# Patient Record
Sex: Female | Born: 1975 | Race: White | Hispanic: No | Marital: Married | Smoking: Former smoker
Health system: Southern US, Community
[De-identification: ages and names within clinical notes are randomized; demographics above are authoritative.]

## PROBLEM LIST (undated history)

## (undated) ENCOUNTER — Emergency Department (HOSPITAL_COMMUNITY): Discharge status: Absent without leave | Payer: Managed Care, Other (non HMO)

## (undated) DIAGNOSIS — F419 Anxiety disorder, unspecified: Secondary | ICD-10-CM

## (undated) DIAGNOSIS — D649 Anemia, unspecified: Secondary | ICD-10-CM

## (undated) DIAGNOSIS — Z9189 Other specified personal risk factors, not elsewhere classified: Secondary | ICD-10-CM

## (undated) DIAGNOSIS — G8929 Other chronic pain: Secondary | ICD-10-CM

## (undated) DIAGNOSIS — M069 Rheumatoid arthritis, unspecified: Secondary | ICD-10-CM

## (undated) DIAGNOSIS — R112 Nausea with vomiting, unspecified: Secondary | ICD-10-CM

## (undated) DIAGNOSIS — Z9889 Other specified postprocedural states: Secondary | ICD-10-CM

## (undated) DIAGNOSIS — N200 Calculus of kidney: Secondary | ICD-10-CM

## (undated) DIAGNOSIS — R51 Headache: Secondary | ICD-10-CM

## (undated) DIAGNOSIS — R0600 Dyspnea, unspecified: Secondary | ICD-10-CM

## (undated) DIAGNOSIS — F32A Depression, unspecified: Secondary | ICD-10-CM

## (undated) DIAGNOSIS — R519 Headache, unspecified: Secondary | ICD-10-CM

## (undated) DIAGNOSIS — N390 Urinary tract infection, site not specified: Secondary | ICD-10-CM

## (undated) DIAGNOSIS — I10 Essential (primary) hypertension: Secondary | ICD-10-CM

## (undated) DIAGNOSIS — Z8719 Personal history of other diseases of the digestive system: Secondary | ICD-10-CM

## (undated) DIAGNOSIS — K219 Gastro-esophageal reflux disease without esophagitis: Secondary | ICD-10-CM

## (undated) DIAGNOSIS — Z87442 Personal history of urinary calculi: Secondary | ICD-10-CM

## (undated) DIAGNOSIS — G43909 Migraine, unspecified, not intractable, without status migrainosus: Secondary | ICD-10-CM

## (undated) DIAGNOSIS — Z9289 Personal history of other medical treatment: Secondary | ICD-10-CM

## (undated) DIAGNOSIS — R002 Palpitations: Secondary | ICD-10-CM

## (undated) HISTORY — DX: Headache, unspecified: R51.9

## (undated) HISTORY — DX: Gastro-esophageal reflux disease without esophagitis: K21.9

## (undated) HISTORY — DX: Urinary tract infection, site not specified: N39.0

## (undated) HISTORY — DX: Essential (primary) hypertension: I10

## (undated) HISTORY — DX: Palpitations: R00.2

## (undated) HISTORY — DX: Headache: R51

## (undated) HISTORY — DX: Calculus of kidney: N20.0

## (undated) HISTORY — DX: Personal history of other medical treatment: Z92.89

## (undated) HISTORY — DX: Migraine, unspecified, not intractable, without status migrainosus: G43.909

## (undated) HISTORY — DX: Morbid (severe) obesity due to excess calories: E66.01

## (undated) HISTORY — PX: HIATAL HERNIA REPAIR: SHX195

## (undated) HISTORY — DX: Other specified personal risk factors, not elsewhere classified: Z91.89

## (undated) HISTORY — DX: Dyspnea, unspecified: R06.00

---
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
## 2003-02-28 HISTORY — PX: CARPAL TUNNEL RELEASE: SHX101

## 2004-03-17 ENCOUNTER — Emergency Department: Payer: Self-pay | Admitting: Unknown Physician Specialty

## 2004-03-18 ENCOUNTER — Emergency Department: Payer: Self-pay | Admitting: Emergency Medicine

## 2004-06-29 ENCOUNTER — Ambulatory Visit: Payer: Self-pay | Admitting: Orthopedic Surgery

## 2004-10-06 ENCOUNTER — Ambulatory Visit: Payer: Self-pay | Admitting: *Deleted

## 2005-04-04 ENCOUNTER — Ambulatory Visit: Payer: Self-pay | Admitting: Orthopedic Surgery

## 2006-01-24 ENCOUNTER — Emergency Department: Payer: Self-pay | Admitting: Emergency Medicine

## 2007-07-09 ENCOUNTER — Emergency Department: Payer: Self-pay | Admitting: Internal Medicine

## 2008-10-31 ENCOUNTER — Emergency Department: Payer: Self-pay | Admitting: Emergency Medicine

## 2010-07-21 ENCOUNTER — Emergency Department: Payer: Self-pay | Admitting: Emergency Medicine

## 2012-10-01 ENCOUNTER — Ambulatory Visit: Payer: Self-pay | Admitting: Specialist

## 2012-10-01 LAB — PHOSPHORUS: Phosphorus: 2.6 mg/dL (ref 2.5–4.9)

## 2012-10-01 LAB — IRON AND TIBC
Iron Saturation: 12 %
Unbound Iron-Bind.Cap.: 483 ug/dL

## 2012-10-01 LAB — MAGNESIUM: Magnesium: 1.8 mg/dL

## 2012-10-01 LAB — LIPASE, BLOOD: Lipase: 98 U/L (ref 73–393)

## 2012-10-01 LAB — HEPATIC FUNCTION PANEL A (ARMC)
Albumin: 3.6 g/dL (ref 3.4–5.0)
Alkaline Phosphatase: 78 U/L (ref 50–136)
Bilirubin, Direct: 0.1 mg/dL (ref 0.00–0.20)
SGOT(AST): 37 U/L (ref 15–37)
SGPT (ALT): 42 U/L (ref 12–78)

## 2012-10-01 LAB — PROTIME-INR
INR: 0.9
Prothrombin Time: 12.3 secs (ref 11.5–14.7)

## 2012-10-01 LAB — FERRITIN: Ferritin (ARMC): 13 ng/mL (ref 8–388)

## 2012-10-01 LAB — FOLATE: Folic Acid: 12.4 ng/mL (ref 3.1–100.0)

## 2012-10-01 LAB — HEMOGLOBIN A1C: Hemoglobin A1C: 4.9 % (ref 4.2–6.3)

## 2012-10-01 LAB — TSH: Thyroid Stimulating Horm: 1.19 u[IU]/mL

## 2012-10-22 ENCOUNTER — Ambulatory Visit: Payer: Self-pay | Admitting: Specialist

## 2012-10-28 ENCOUNTER — Ambulatory Visit: Payer: Self-pay | Admitting: Specialist

## 2013-02-27 HISTORY — PX: CHOLECYSTECTOMY: SHX55

## 2013-04-14 HISTORY — PX: GASTRIC BYPASS: SHX52

## 2013-09-11 ENCOUNTER — Ambulatory Visit: Payer: Self-pay | Admitting: Specialist

## 2014-02-07 ENCOUNTER — Emergency Department: Payer: Self-pay | Admitting: Emergency Medicine

## 2014-02-07 LAB — URINALYSIS, COMPLETE
BILIRUBIN, UR: NEGATIVE
GLUCOSE, UR: NEGATIVE mg/dL (ref 0–75)
Hyaline Cast: 29
Leukocyte Esterase: NEGATIVE
Nitrite: NEGATIVE
Ph: 5 (ref 4.5–8.0)
Specific Gravity: 1.029 (ref 1.003–1.030)
WBC UR: 6 /HPF (ref 0–5)

## 2014-02-07 LAB — CBC WITH DIFFERENTIAL/PLATELET
Basophil #: 0 10*3/uL (ref 0.0–0.1)
Basophil %: 0.2 %
EOS ABS: 0.1 10*3/uL (ref 0.0–0.7)
Eosinophil %: 1 %
HCT: 42.1 % (ref 35.0–47.0)
HGB: 13.8 g/dL (ref 12.0–16.0)
LYMPHS ABS: 2 10*3/uL (ref 1.0–3.6)
Lymphocyte %: 23.9 %
MCH: 29.2 pg (ref 26.0–34.0)
MCHC: 32.9 g/dL (ref 32.0–36.0)
MCV: 89 fL (ref 80–100)
Monocyte #: 0.8 x10 3/mm (ref 0.2–0.9)
Monocyte %: 9.3 %
NEUTROS PCT: 65.6 %
Neutrophil #: 5.5 10*3/uL (ref 1.4–6.5)
Platelet: 214 10*3/uL (ref 150–440)
RBC: 4.74 10*6/uL (ref 3.80–5.20)
RDW: 15.4 % — AB (ref 11.5–14.5)
WBC: 8.4 10*3/uL (ref 3.6–11.0)

## 2014-02-07 LAB — BASIC METABOLIC PANEL
Anion Gap: 8 (ref 7–16)
BUN: 12 mg/dL (ref 7–18)
CREATININE: 0.58 mg/dL — AB (ref 0.60–1.30)
Calcium, Total: 9.1 mg/dL (ref 8.5–10.1)
Chloride: 105 mmol/L (ref 98–107)
Co2: 25 mmol/L (ref 21–32)
EGFR (African American): >60
EGFR (Non-African Amer.): >60
GLUCOSE: 95 mg/dL (ref 65–99)
Osmolality: 275 (ref 275–301)
Potassium: 3.8 mmol/L (ref 3.5–5.1)
Sodium: 138 mmol/L (ref 136–145)

## 2014-02-07 LAB — TROPONIN I

## 2014-03-24 ENCOUNTER — Encounter: Payer: Self-pay | Admitting: Nurse Practitioner

## 2014-03-24 ENCOUNTER — Ambulatory Visit (INDEPENDENT_AMBULATORY_CARE_PROVIDER_SITE_OTHER): Payer: Managed Care, Other (non HMO) | Admitting: Nurse Practitioner

## 2014-03-24 ENCOUNTER — Encounter (INDEPENDENT_AMBULATORY_CARE_PROVIDER_SITE_OTHER): Payer: Self-pay

## 2014-03-24 VITALS — BP 102/72 | HR 64 | Temp 98.2°F | Resp 14 | Ht 68.5 in | Wt 289.8 lb

## 2014-03-24 DIAGNOSIS — J0100 Acute maxillary sinusitis, unspecified: Secondary | ICD-10-CM

## 2014-03-24 MED ORDER — HYDROCOD POLST-CHLORPHEN POLST 10-8 MG/5ML PO LQCR: 5.0000 mL | Freq: Every evening | ORAL | Status: DC | PRN

## 2014-03-24 NOTE — Progress Notes (Signed)
Pre visit review using our clinic review tool, if applicable. No additional management support is needed unless otherwise documented below in the visit note. 

## 2014-03-24 NOTE — Assessment & Plan Note (Signed)
Stable. Pt is still on antibiotics for strep throat. Biggest complaint is lack of sleep from coughing due to PNDrip. Rx for Tussionex at night time. FU prn.

## 2014-03-24 NOTE — Progress Notes (Signed)
Subjective:    Patient ID: [name], female    DOB: 1975-09-02, 39 y.o.   MRN: [id number]  HPI  Ms. Panchal is a 39 yo female with a CC of needing to establish care, strep 1 week ago, and facial pain with runny nose.   1) New pt information:   Diet- High protein, little carbs  Exercise- Unable to recently due to illness  Eye Exam- 2014 or 2013  Dental Exam- Up to date  2) Chronic Problems-  Dr. Darnell Level- Gastric Bypass and Gallbladder 2015  [facility]   Dr. Dear- (Left from [place])    UTI's, one around Christmas time, was hot, spinning head, went to ED 2nd one since August.   3) Acute Problems-  Strep last week- Whole family. Saw telemedicine MD and they assumed strep due to family being positive. Omnicef 300 mg twice daily 7 days of 10 day course. Sill having rhinorrhea and sinus pressure. Cough is worst at night time. Codeine caused stomach pain last time.   Review of Systems  Constitutional: Negative for fever, chills, diaphoresis and fatigue.  HENT: Positive for rhinorrhea, sinus pressure and sore throat. Negative for sneezing.        Clear  Eyes: Negative for visual disturbance.  Respiratory: Positive for cough.   Gastrointestinal: Negative for nausea, vomiting, abdominal pain, diarrhea, constipation and abdominal distention.  Genitourinary: Negative for dysuria.  Skin: Negative for rash.  Hematological: Does not bruise/bleed easily.  Psychiatric/Behavioral: Negative for suicidal ideas and sleep disturbance. The patient is not nervous/anxious.    Past Medical History  Diagnosis Date  . History of fainting spells of unknown cause   . Frequent headaches   . GERD (gastroesophageal reflux disease)   . Hypertension   . Kidney stones   . Migraine   . UTI (lower urinary tract infection)     History   Social History  . Marital Status: Married    Spouse Name: N/A    Number of Children: N/A  . Years of Education: N/A    Occupational History  . Not on file.   Social History Main Topics  . Smoking status: Never Smoker   . Smokeless tobacco: Never Used  . Alcohol Use: No  . Drug Use: No  . Sexual Activity:    Partners: Male     Comment: Husband   Other Topics Concern  . 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 Cholecystectomy  2015  . Gastric bypass  04/14/13  . Growth plate surgery  5638    Right hip  . Carpal tunnel release  2005    Family History  Problem Relation Age of Onset  . Arthritis Mother   . Hypertension Mother   . Mental illness Mother   . Diabetes Mother   . Alcohol abuse Father   . Cancer Father   . Hypertension Father   . Mental illness Brother   . Heart disease Maternal Grandmother   . Hypertension Maternal Grandmother   . Mental illness Maternal Grandmother   . Diabetes Maternal Grandmother   . Cancer Maternal Grandfather   . Hypertension Maternal Grandfather   . Mental illness  Paternal Grandmother     Not on File  No current outpatient prescriptions on file prior to visit.   No current facility-administered medications on file prior to visit.      Objective:   Physical Exam  Constitutional: She is oriented to person, place, and time. She appears well-developed and well-nourished. No distress.  BP 102/72 mmHg  Pulse 64  Temp(Src) 98.2 F (36.8 C) (Oral)  Resp 14  Ht 5' 8.5" (1.74 m)  Wt 289 lb 12.8 oz (131.452 kg)  BMI 43.42 kg/m2  SpO2 97%  LMP    HENT:  Head: Normocephalic and atraumatic.  Right Ear: External ear normal.  Left Ear: External ear normal.  Cardiovascular: Normal rate, regular rhythm, normal heart sounds and intact distal pulses.  Exam reveals no gallop and no friction rub.   No murmur heard. Pulmonary/Chest: Effort  normal and breath sounds normal. No respiratory distress. She has no wheezes. She has no rales. She exhibits no tenderness.  Neurological: She is alert and oriented to person, place, and time. No cranial nerve deficit. She exhibits normal muscle tone. Coordination normal.  Skin: Skin is warm and dry. No rash noted. She is not diaphoretic.  Psychiatric: She has a normal mood and affect. Her behavior is normal. Judgment and thought content normal.      Assessment & Plan:

## 2014-03-24 NOTE — Patient Instructions (Signed)
Keep an eye on your symptoms. Make your appointment for a physical at the [facility] desk.   Tussionex for the cough at night time.   Welcome to [facility]!

## 2014-03-25 ENCOUNTER — Encounter: Payer: Self-pay | Admitting: Nurse Practitioner

## 2014-03-27 ENCOUNTER — Other Ambulatory Visit: Payer: Self-pay | Admitting: Nurse Practitioner

## 2014-03-27 ENCOUNTER — Encounter: Payer: Self-pay | Admitting: Nurse Practitioner

## 2014-03-27 MED ORDER — FLUTICASONE PROPIONATE 50 MCG/ACT NA SUSP: 2.0000 | Freq: Every day | NASAL | Status: DC

## 2014-04-22 ENCOUNTER — Ambulatory Visit (INDEPENDENT_AMBULATORY_CARE_PROVIDER_SITE_OTHER): Payer: Managed Care, Other (non HMO) | Admitting: Nurse Practitioner

## 2014-04-22 ENCOUNTER — Encounter: Payer: Self-pay | Admitting: Nurse Practitioner

## 2014-04-22 ENCOUNTER — Other Ambulatory Visit (HOSPITAL_COMMUNITY)
Admission: RE | Admit: 2014-04-22 | Discharge: 2014-04-22 | Disposition: A | Discharge status: Home / Self Care | Payer: Managed Care, Other (non HMO) | Source: Ambulatory Visit | Attending: Nurse Practitioner | Admitting: Nurse Practitioner

## 2014-04-22 VITALS — BP 122/70 | HR 60 | Temp 97.9°F | Resp 14 | Ht 68.5 in | Wt 291.4 lb

## 2014-04-22 DIAGNOSIS — M5412 Radiculopathy, cervical region: Secondary | ICD-10-CM

## 2014-04-22 DIAGNOSIS — Z124 Encounter for screening for malignant neoplasm of cervix: Secondary | ICD-10-CM

## 2014-04-22 DIAGNOSIS — Z1151 Encounter for screening for human papillomavirus (HPV): Secondary | ICD-10-CM | POA: Diagnosis present

## 2014-04-22 DIAGNOSIS — Z01419 Encounter for gynecological examination (general) (routine) without abnormal findings: Secondary | ICD-10-CM | POA: Insufficient documentation

## 2014-04-22 DIAGNOSIS — Z9884 Bariatric surgery status: Secondary | ICD-10-CM

## 2014-04-22 DIAGNOSIS — M25562 Pain in left knee: Secondary | ICD-10-CM

## 2014-04-22 LAB — LIPID PANEL
Cholesterol: 144 mg/dL (ref 0–200)
HDL: 55.5 mg/dL (ref >39.00)
LDL CALC: 79 mg/dL (ref 0–99)
NONHDL: 88.5
Total CHOL/HDL Ratio: 3
Triglycerides: 46 mg/dL (ref 0.0–149.0)
VLDL: 9.2 mg/dL (ref 0.0–40.0)

## 2014-04-22 LAB — HEMOGLOBIN A1C: HEMOGLOBIN A1C: 4.9 % (ref 4.6–6.5)

## 2014-04-22 LAB — MAGNESIUM: MAGNESIUM: 2 mg/dL (ref 1.5–2.5)

## 2014-04-22 LAB — TSH: TSH: 0.68 u[IU]/mL (ref 0.35–4.50)

## 2014-04-22 LAB — FOLATE: FOLATE: 12.4 ng/mL (ref >5.9)

## 2014-04-22 MED ORDER — PREDNISONE 10 MG PO TABS: ORAL_TABLET | ORAL | Status: DC

## 2014-04-22 MED ORDER — GABAPENTIN 300 MG PO CAPS: 300.0000 mg | ORAL_CAPSULE | Freq: Every day | ORAL | Status: DC
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
## 2014-04-22 NOTE — Patient Instructions (Signed)

## 2014-04-22 NOTE — Progress Notes (Signed)
Pre visit review using our clinic review tool, if applicable. No additional management support is needed unless otherwise documented below in the visit note. 

## 2014-04-24 LAB — CYTOLOGY - PAP

## 2014-04-25 DIAGNOSIS — M25569 Pain in unspecified knee: Secondary | ICD-10-CM | POA: Insufficient documentation

## 2014-04-25 DIAGNOSIS — M5412 Radiculopathy, cervical region: Secondary | ICD-10-CM | POA: Insufficient documentation

## 2014-04-25 DIAGNOSIS — Z01419 Encounter for gynecological examination (general) (routine) without abnormal findings: Secondary | ICD-10-CM | POA: Insufficient documentation

## 2014-04-25 DIAGNOSIS — M25562 Pain in left knee: Secondary | ICD-10-CM | POA: Insufficient documentation

## 2014-04-25 NOTE — Assessment & Plan Note (Signed)
Referral to ortho requested by Pt.

## 2014-04-25 NOTE — Assessment & Plan Note (Signed)
Discussed acute and chronic issues. Reviewed health maintenance measures, PFSHx, and immunizations. Obtain routine labs TSH, Lipid panel, A1c, --> rest was already performed for her routine visit to her bariatric surgeon. Requesting Vitamin B1, magnesium, and folate, will obtain these. Pap and breast exam normal.  (Future information--> uses large speculum).

## 2014-04-25 NOTE — Assessment & Plan Note (Signed)
Referral to ortho requested by pt.

## 2014-04-26 ENCOUNTER — Encounter: Payer: Self-pay | Admitting: Nurse Practitioner

## 2014-04-26 LAB — VITAMIN B1: VITAMIN B1 (THIAMINE): 12 nmol/L (ref 8–30)

## 2014-04-27 ENCOUNTER — Encounter: Payer: Self-pay | Admitting: Nurse Practitioner

## 2014-06-22 ENCOUNTER — Encounter: Payer: Self-pay | Admitting: Nurse Practitioner

## 2014-06-22 ENCOUNTER — Ambulatory Visit (INDEPENDENT_AMBULATORY_CARE_PROVIDER_SITE_OTHER): Payer: Managed Care, Other (non HMO) | Admitting: Nurse Practitioner

## 2014-06-22 VITALS — BP 118/64 | HR 60 | Temp 98.1°F | Resp 14 | Ht 68.5 in | Wt 279.0 lb

## 2014-06-22 DIAGNOSIS — M25562 Pain in left knee: Secondary | ICD-10-CM

## 2014-06-22 DIAGNOSIS — R350 Frequency of micturition: Secondary | ICD-10-CM | POA: Insufficient documentation

## 2014-06-22 DIAGNOSIS — M545 Low back pain, unspecified: Secondary | ICD-10-CM

## 2014-06-22 DIAGNOSIS — M5412 Radiculopathy, cervical region: Secondary | ICD-10-CM

## 2014-06-22 DIAGNOSIS — J0100 Acute maxillary sinusitis, unspecified: Secondary | ICD-10-CM

## 2014-06-22 DIAGNOSIS — M549 Dorsalgia, unspecified: Secondary | ICD-10-CM | POA: Insufficient documentation

## 2014-06-22 MED ORDER — DOXYCYCLINE HYCLATE 100 MG PO TABS: 100.0000 mg | ORAL_TABLET | Freq: Two times a day (BID) | ORAL | Status: DC

## 2014-06-22 MED ORDER — GABAPENTIN 400 MG PO CAPS: 400.0000 mg | ORAL_CAPSULE | Freq: Every day | ORAL | Status: DC

## 2014-06-22 NOTE — Assessment & Plan Note (Signed)
Upped Gabapentin to 400 mg at night. Pt wants to try this instead of upping to 300 mg twice daily. Pt reported slight wooziness after first 3 days of taking, but improved with use. Feels numbness/tingling was improved. Will follow.

## 2014-06-22 NOTE — Assessment & Plan Note (Signed)
Uncontrolled. Will treat with doxy since PCN allergy. Encouraged probiotic use. Will follow.

## 2014-06-22 NOTE — Assessment & Plan Note (Signed)
Muscle spasms incapacitate pt approximately 4 x a year. Missed 2 days of work so far this year. Company asked pt to apply for FMLA. Flexeril 10 mg as needed on board.

## 2014-06-22 NOTE — Patient Instructions (Addendum)
Take the doxycyline for sinusitis twice daily for 7 days.   Please take a probiotic ( Align, Floraque or Culturelle) while you are on the antibiotic to prevent a serious antibiotic associated diarrhea  Called clostirudium dificile colitis and a vaginal yeast infection.   Try the gabapentin 400 mg at night.   Talk with [facility] about IUD/Bladder issues.

## 2014-06-22 NOTE — Assessment & Plan Note (Signed)
Pt has noticed urinary frequency and ammonia smell since IUD insertion. Asked pt to discuss with OB/GYN and she is in the process of switching to [facility] from [place]. This also interferes with job. She gets a 10 min. Break every 5 hours and reports that she is unable to leave her computer and phone. Will look at paper work given today.

## 2014-06-22 NOTE — Progress Notes (Signed)
Pre visit review using our clinic review tool, if applicable. No additional management support is needed unless otherwise documented below in the visit note. 

## 2014-06-22 NOTE — Assessment & Plan Note (Signed)
Seeing ortho. Has pain medication on board for severe pain.

## 2014-06-22 NOTE — Progress Notes (Signed)
Subjective:    Patient ID: Jill Hawkins, female    DOB: December 23, 1975, 39 y.o.   MRN: [id number]  HPI  Jill Hawkins is a 39 yo female here with a follow up for bladder issues and for FMLA paperwork/ADAAA paperwork.   1) Human resources- Chronic back pain- FMLA   Missed 2 days due to locked up back, 4 x a year    Has meds from Ortho.   Urine breaks often, needs accomodation form so she can use  the restroom.   2) Bladder issues- after surgery, IUD in May at [facility] an intermittent ammonia smell, thinks due to IUD  3) Tingling into right arm, numbness wakes her up. X-ray done at [facility]. X-ray was negative for findings.   4) Sinus- maxillary, ear pressure bilaterally L>R, x 7 days, headaches from pressure. Bad smell in nose. Sudafed not helpful.   Review of Systems  Constitutional: Negative for fever, chills, diaphoresis and fatigue.  HENT: Positive for sinus pressure.   Respiratory: Negative for chest tightness, shortness of breath and wheezing.   Cardiovascular: Negative for chest pain, palpitations and leg swelling.  Gastrointestinal: Negative for nausea, vomiting and diarrhea.  Genitourinary: Positive for frequency.  Musculoskeletal: Positive for back pain.  Skin: Negative for rash.  Neurological: Positive for numbness. Negative for dizziness, weakness and headaches.       Right arm  Psychiatric/Behavioral: The patient is not nervous/anxious.    Past Medical History  Diagnosis Date  . History of fainting spells of unknown cause   . Frequent headaches   . GERD (gastroesophageal reflux disease)   . Hypertension   . Kidney stones   . Migraine   . UTI (lower urinary tract infection)     History   Social History  . Marital Status: Married    Spouse Name: N/A  . Number of Children: N/A  . Years of Education: N/A   Occupational History  . Not on file.   Social History Main Topics  . Smoking status: Never Smoker   . Smokeless tobacco: Never Used  .  Alcohol Use: No  . Drug Use: No  . Sexual Activity:    Partners: Male     Comment: Husband   Other Topics Concern  . 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 Cholecystectomy  2015  . Gastric bypass  04/14/13  . Growth plate surgery  5809    Right hip  . Carpal tunnel release  2005    Family History  Problem Relation Age of Onset  . Arthritis Mother   . Hypertension Mother   . Mental illness Mother   . Diabetes Mother   . Alcohol abuse Father   . Cancer Father   . Hypertension Father   . Mental illness Brother   . Heart disease Maternal Grandmother   . Hypertension Maternal Grandmother   . Mental illness Maternal Grandmother   . Diabetes Maternal Grandmother   . Cancer Maternal Grandfather   . Hypertension Maternal Grandfather   . Mental illness Paternal Grandmother     Allergies  Allergen Reactions  . Penicillins Rash and Swelling  . Oxycodone-Acetaminophen Rash  . Tramadol Anxiety  and Itching  . Codeine Other (See Comments)    Chest pains  . Tape Hives    Current Outpatient Prescriptions on File Prior to Visit  Medication Sig Dispense Refill  . ALPRAZolam (XANAX) 1 MG tablet Take 1 mg by mouth as needed for anxiety.    Marland Kitchen amLODipine (NORVASC) 5 MG tablet Take 5 mg by mouth daily.    . chlorpheniramine-HYDROcodone (TUSSIONEX PENNKINETIC ER) 10-8 MG/5ML LQCR Take 5 mLs by mouth at bedtime as needed for cough. 115 mL 0  . cyanocobalamin (,VITAMIN B-12,) 1000 MCG/ML injection Inject 1,000 mcg into the muscle every 30 (thirty) days.    . cyclobenzaprine (FLEXERIL) 10 MG tablet Take 10 mg by mouth 3 (three) times daily as needed for muscle spasms.    . Dexlansoprazole 30 MG capsule Take 30 mg by mouth daily.    . fluticasone (FLONASE) 50 MCG/ACT nasal spray  Place 2 sprays into both nostrils daily. 16 g 6  . folic acid (FOLVITE) 1 MG tablet Take 1 mg by mouth daily.    . methocarbamol (ROBAXIN) 750 MG tablet Take 750 mg by mouth 2 (two) times daily.    . pseudoephedrine (SUDAFED) 30 MG tablet Take 30 mg by mouth every 8 (eight) hours as needed for congestion (OTC).    Marland Kitchen sucralfate (CARAFATE) 1 GM/10ML suspension Take 1 g by mouth 4 (four) times daily.    . SUMAtriptan (IMITREX) 100 MG tablet Take 100 mg by mouth as needed for migraine or headache. May repeat in 2 hours if headache persists or recurs.     No current facility-administered medications on file prior to visit.       Objective:   Physical Exam  Constitutional: She is oriented to person, place, and time. She appears well-developed and well-nourished. No distress.  BP 118/64 mmHg  Pulse 60  Temp(Src) 98.1 F (36.7 C) (Oral)  Resp 14  Ht 5' 8.5" (1.74 m)  Wt 279 lb (126.554 kg)  BMI 41.80 kg/m2  SpO2 97%   HENT:  Head: Normocephalic and atraumatic.  Right Ear: External ear normal.  Left Ear: External ear normal.  Maxillary tenderness bilaterally TM's normal bilaterally  Eyes: EOM are normal. Pupils are equal, round, and reactive to light. Right eye exhibits no discharge. Left eye exhibits no discharge. No scleral icterus.  Neck: Normal range of motion. Neck supple. No thyromegaly present.  Cardiovascular: Normal rate, regular rhythm, normal heart sounds and intact distal pulses.  Exam reveals no gallop and no friction rub.   No murmur heard. Pulmonary/Chest: Effort normal and breath sounds normal. No respiratory distress. She has no wheezes. She has no rales. She exhibits no tenderness.  Musculoskeletal: Normal range of motion. She exhibits no edema or tenderness.  Lymphadenopathy:    She has no cervical adenopathy.  Neurological: She is alert and oriented to person, place, and time. No cranial nerve deficit. She exhibits normal muscle tone. Coordination normal.  Skin: Skin  is warm and dry. No rash noted. She is not diaphoretic.  Psychiatric: She has a normal mood and affect. Her behavior is normal. Judgment and thought content normal.      Assessment & Plan:

## 2014-06-26 ENCOUNTER — Telehealth: Payer: Self-pay

## 2014-06-26 NOTE — Telephone Encounter (Signed)
Notified patient FMLA paperwork is ready to be picked up.  Placed in administrative drawer in front office.

## 2014-07-09 ENCOUNTER — Telehealth: Payer: Self-pay

## 2014-07-09 NOTE — Telephone Encounter (Signed)
Called patient and encouraged her to bring back FMLA paperwork to have re-faxed. I did not keep her paperwork due to a face to face meeting she was having with her employer. Patient says she may fax it from a different location or bring back into our office.

## 2014-07-09 NOTE — Telephone Encounter (Signed)
The patient called and is hoping to clear up some confusion regarding her FMLA paperwork.  She stated she was clear when asking it be faxed directly to the employer, however, she states her employer does not have it.  She stated the person she spoke with (does not remember who) stated she asked if it was faxed and they responded "yes".  Please call the patient and clear up any confusion regarding the paperwork in order to keep her job status in good standing. Thanks !   Pt callback - [phone] or [phone]

## 2014-07-16 ENCOUNTER — Telehealth: Payer: Self-pay

## 2014-07-16 ENCOUNTER — Other Ambulatory Visit: Payer: Self-pay | Admitting: Nurse Practitioner

## 2014-07-16 ENCOUNTER — Other Ambulatory Visit: Payer: Self-pay

## 2014-07-16 MED ORDER — NYSTATIN-TRIAMCINOLONE 100000-0.1 UNIT/GM-% EX OINT: 1.0000 "application " | TOPICAL_OINTMENT | Freq: Two times a day (BID) | CUTANEOUS | Status: DC

## 2014-07-16 NOTE — Telephone Encounter (Signed)
Sent Mycolog cream into the pharmacy.

## 2014-07-16 NOTE — Telephone Encounter (Signed)
The patient came in and wanted to clarify where her lab results were sent.  She was concerned about her labs being sent to her bariatric dr. I explained the nurse would call the patient back and clarify this issue.  Pt callback - [phone]

## 2014-07-16 NOTE — Telephone Encounter (Signed)
Followed up with patient and clarified questions in regards to lab results. Patient verified understanding.

## 2014-08-03 ENCOUNTER — Other Ambulatory Visit: Payer: Self-pay | Admitting: Nurse Practitioner

## 2014-08-03 ENCOUNTER — Telehealth: Payer: Self-pay | Admitting: Nurse Practitioner

## 2014-08-03 MED ORDER — PREDNISONE 10 MG PO TABS: ORAL_TABLET | ORAL | Status: DC

## 2014-08-03 NOTE — Telephone Encounter (Signed)
Patient is willing to try the prednisone taper first.  If condition worsens, she will go to Urgent Care or ER.

## 2014-08-03 NOTE — Telephone Encounter (Signed)
Patient is complaining of back pain that started last night.  Hx of chronic back pain.  Taking Flexeril, Robaxin and Hydrocodone and says the pain is still unbearable.  Willing to see ortho if you think its needed, but is desperate for relief as soon as possible. Please advise.

## 2014-08-03 NOTE — Telephone Encounter (Signed)
Please let pt know that she will need to be seen by urgent care (she cancelled her appointment for tomorrow). I can try prednisone taper if she wants to try that.

## 2014-08-03 NOTE — Telephone Encounter (Signed)
[facility]  Patient Name: Jill Hawkins  Gender: Female  DOB: 1975/07/04   Age: 39 Y 2 M 15 D  Return Phone Number: [phone] (Primary), [phone] (Secondary)  Address:   City/State/Zip: [place]    Client [facility]  Client Site [facility]  Physician [place], [place] Type Call  Call Type Triage / Clinical  Relationship To Patient Self  Appointment Disposition EMR Caller Not Reached  Info pasted into Epic Yes  Return Phone Number [phone] (Secondary)  Chief Complaint Back Pain - General  Initial Comment Caller states her back is out, on flexeril and robaxin but not helping with current pain   Nurse Assessment      Guidelines      Guideline Title Affirmed Question Affirmed Notes Nurse Date/Time (Ken Caryl Time)         Disp. Time Eilene Ghazi Time) Disposition Final User   08/03/2014 11:28:49 AM Attempt made - no message left  Buck Mam, RN, Trish   08/03/2014 11:30:15 AM Attempt made - no message left  Buck Mam, RN, Trish    08/03/2014 11:09:21 AM Attempt made - message left Yes Buck Mam, RN, Trish       After Care Instructions Given     Call Event Type User Date / Time Description        Comments  User: Rubie Maid, RN Date/Time Eilene Ghazi Time): 08/03/2014 11:31:05 AM  Patient's mail box full on secondary number

## 2014-08-03 NOTE — Telephone Encounter (Signed)
FYI

## 2014-08-04 ENCOUNTER — Ambulatory Visit: Payer: Managed Care, Other (non HMO) | Admitting: Nurse Practitioner

## 2014-08-04 NOTE — Telephone Encounter (Signed)
Prednisone taper was sent in yesterday afternoon. Just wanted to let you know in case she calls.

## 2014-08-06 ENCOUNTER — Encounter: Payer: Self-pay | Admitting: Obstetrics and Gynecology

## 2014-08-13 ENCOUNTER — Emergency Department: Payer: Managed Care, Other (non HMO)

## 2014-08-13 ENCOUNTER — Encounter: Payer: Self-pay | Admitting: Emergency Medicine

## 2014-08-13 ENCOUNTER — Emergency Department
Admission: EM | Admit: 2014-08-13 | Discharge: 2014-08-13 | Disposition: A | Discharge status: Home / Self Care | Payer: Managed Care, Other (non HMO) | Attending: Emergency Medicine | Admitting: Emergency Medicine

## 2014-08-13 DIAGNOSIS — I10 Essential (primary) hypertension: Secondary | ICD-10-CM | POA: Insufficient documentation

## 2014-08-13 DIAGNOSIS — Z792 Long term (current) use of antibiotics: Secondary | ICD-10-CM | POA: Diagnosis not present

## 2014-08-13 DIAGNOSIS — Z880 Allergy status to penicillin: Secondary | ICD-10-CM | POA: Insufficient documentation

## 2014-08-13 DIAGNOSIS — Z7952 Long term (current) use of systemic steroids: Secondary | ICD-10-CM | POA: Diagnosis not present

## 2014-08-13 DIAGNOSIS — Z79899 Other long term (current) drug therapy: Secondary | ICD-10-CM | POA: Diagnosis not present

## 2014-08-13 DIAGNOSIS — M549 Dorsalgia, unspecified: Secondary | ICD-10-CM | POA: Diagnosis present

## 2014-08-13 DIAGNOSIS — Z3202 Encounter for pregnancy test, result negative: Secondary | ICD-10-CM | POA: Insufficient documentation

## 2014-08-13 DIAGNOSIS — M5431 Sciatica, right side: Secondary | ICD-10-CM | POA: Insufficient documentation

## 2014-08-13 DIAGNOSIS — G8929 Other chronic pain: Secondary | ICD-10-CM | POA: Diagnosis not present

## 2014-08-13 LAB — POCT PREGNANCY, URINE: PREG TEST UR: NEGATIVE

## 2014-08-13 MED ORDER — PREDNISONE 20 MG PO TABS
ORAL_TABLET | ORAL | Status: AC
  Administered 2014-08-13: 60 mg via ORAL
  Filled 2014-08-13: qty 3

## 2014-08-13 MED ORDER — PREDNISONE 10 MG (21) PO TBPK: ORAL_TABLET | ORAL | Status: DC

## 2014-08-13 MED ORDER — HYDROCODONE-ACETAMINOPHEN 5-325 MG PO TABS
2.0000 | ORAL_TABLET | Freq: Once | ORAL | Status: AC
  Administered 2014-08-13: 2 via ORAL

## 2014-08-13 MED ORDER — PREDNISONE 20 MG PO TABS
60.0000 mg | ORAL_TABLET | Freq: Once | ORAL | Status: AC
  Administered 2014-08-13: 60 mg via ORAL

## 2014-08-13 MED ORDER — ONDANSETRON 8 MG PO TBDP
ORAL_TABLET | ORAL | Status: AC
  Administered 2014-08-13: 8 mg via ORAL
  Filled 2014-08-13: qty 1

## 2014-08-13 MED ORDER — HYDROCODONE-ACETAMINOPHEN 5-325 MG PO TABS: 2.0000 | ORAL_TABLET | Freq: Four times a day (QID) | ORAL | Status: DC | PRN

## 2014-08-13 MED ORDER — HYDROCODONE-ACETAMINOPHEN 5-325 MG PO TABS
ORAL_TABLET | ORAL | Status: AC
  Administered 2014-08-13: 2 via ORAL
  Filled 2014-08-13: qty 2

## 2014-08-13 MED ORDER — ONDANSETRON 8 MG PO TBDP
8.0000 mg | ORAL_TABLET | Freq: Once | ORAL | Status: AC
  Administered 2014-08-13: 8 mg via ORAL

## 2014-08-13 NOTE — ED Notes (Signed)
Pt reports that she has chronic back pain. She sees Dr. Prescott Parma for pain. She has been taking hydrocodone, prednisone, and robaxin that is not helping. She states that she been doing ice and heat without relief. She states that the pain is so bad that she is nauseated.

## 2014-08-13 NOTE — ED Provider Notes (Signed)
[facility] Emergency Department Provider Note  ____________________________________________  Time seen: Approximately 8:12 PM  I have reviewed the triage vital signs and the nursing notes.   HISTORY  Chief Complaint Back Pain    HPI Jill Hawkins is a 39 y.o. female with history of chronic back pain. She presents with one week of worsening right-sided back pain with radiation to the ankle. No weakness. No abdominal pain, urinary symptoms or fevers and chills. She denies chest pain or shortness of breath. She has a history of gastric bypass surgery. She has been seen by orthopedics in the last several months for arm and shoulder pain. She has been treated with muscle relaxants and Norco. This is not controlling pain currently.   Past Medical History  Diagnosis Date  . History of fainting spells of unknown cause   . Frequent headaches   . GERD (gastroesophageal reflux disease)   . Hypertension   . Kidney stones   . Migraine   . UTI (lower urinary tract infection)     Patient Active Problem List   Diagnosis Date Noted  . Back pain 06/22/2014  . Urinary frequency 06/22/2014  . Encounter for routine gynecological examination 04/25/2014  . Right cervical radiculopathy 04/25/2014  . Left knee pain 04/25/2014  . Sinusitis, acute maxillary 03/24/2014    Past Surgical History  Procedure Laterality Date  . Cholecystectomy  2015  . Gastric bypass  04/14/13  . 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 Carpal tunnel release  2005    Current Outpatient Rx  Name  Route  Sig  Dispense  Refill  . ALPRAZolam (XANAX) 1 MG tablet   Oral   Take 1 mg by mouth as needed for anxiety.         Marland Kitchen amLODipine (NORVASC) 5 MG tablet   Oral   Take 5 mg by mouth daily.         . chlorpheniramine-HYDROcodone (TUSSIONEX PENNKINETIC ER) 10-8 MG/5ML LQCR   Oral   Take 5 mLs by mouth at bedtime as needed for cough.   115 mL   0   . cyanocobalamin (,VITAMIN  B-12,) 1000 MCG/ML injection   Intramuscular   Inject 1,000 mcg into the muscle every 30 (thirty) days.         . cyclobenzaprine (FLEXERIL) 10 MG tablet   Oral   Take 10 mg by mouth 3 (three) times daily as needed for muscle spasms.         . Dexlansoprazole 30 MG capsule   Oral   Take 30 mg by mouth daily.         . diclofenac sodium (VOLTAREN) 1 % GEL   Topical   Apply 4 g topically as needed.         . doxycycline (VIBRA-TABS) 100 MG tablet   Oral   Take 1 tablet (100 mg total) by mouth 2 (two) times daily.   14 tablet   0   . fluticasone (FLONASE) 50 MCG/ACT nasal spray   Each Nare   Place 2 sprays into both nostrils daily.   16 g   6   . folic acid (FOLVITE) 1 MG tablet   Oral   Take 1 mg by mouth daily.         Marland Kitchen gabapentin (NEURONTIN) 400 MG capsule   Oral   Take 1 capsule (400 mg total) by mouth at bedtime.   30 capsule   1   .  HYDROcodone-acetaminophen (NORCO) 5-325 MG per tablet   Oral   Take 2 tablets by mouth every 6 (six) hours as needed for moderate pain.   30 tablet   0   . methocarbamol (ROBAXIN) 750 MG tablet   Oral   Take 750 mg by mouth 2 (two) times daily.         Marland Kitchen nystatin-triamcinolone ointment (MYCOLOG)   Topical   Apply 1 application topically 2 (two) times daily.   30 g   0   . predniSONE (STERAPRED UNI-PAK 21 TAB) 10 MG (21) TBPK tablet      6 tablets on day 1, 5 tablets on day 2, 4 tablets on day 3, etc...   21 tablet   0   . pseudoephedrine (SUDAFED) 30 MG tablet   Oral   Take 30 mg by mouth every 8 (eight) hours as needed for congestion (OTC).         Marland Kitchen sucralfate (CARAFATE) 1 GM/10ML suspension   Oral   Take 1 g by mouth 4 (four) times daily.         . SUMAtriptan (IMITREX) 100 MG tablet   Oral   Take 100 mg by mouth as needed for migraine or headache. May repeat in 2 hours if headache persists or recurs.           Allergies Penicillins; Oxycodone-acetaminophen; Tramadol; Codeine; and  Tape  Family History  Problem Relation Age of Onset  . Arthritis Mother   . Hypertension Mother   . Mental illness Mother   . Diabetes Mother   . Alcohol abuse Father   . Cancer Father   . Hypertension Father   . Mental illness Brother   . Heart disease Maternal Grandmother   . Hypertension Maternal Grandmother   . Mental illness Maternal Grandmother   . Diabetes Maternal Grandmother   . Cancer Maternal Grandfather   . Hypertension Maternal Grandfather   . Mental illness Paternal Grandmother     Social History History  Substance Use Topics  . Smoking status: Never Smoker   . Smokeless tobacco: Never Used  . Alcohol Use: No    Review of Systems Constitutional: No fever/chills Eyes: No visual changes. ENT: No sore throat. Cardiovascular: Denies chest pain. Respiratory: Denies shortness of breath. Gastrointestinal: No abdominal pain.  No nausea, no vomiting.  No diarrhea.  No constipation. Genitourinary: Negative for dysuria. Musculoskeletal: positive for back pain. Skin: Negative for rash. Neurological: Negative for headaches, focal weakness or numbness.  10-point ROS otherwise negative.  ____________________________________________   PHYSICAL EXAM:  VITAL SIGNS: ED Triage Vitals  Enc Vitals Group     BP 08/13/14 1824 123/69 mmHg     Pulse Rate 08/13/14 1824 73     Resp 08/13/14 1824 20     Temp 08/13/14 1824 98.1 F (36.7 C)     Temp Source 08/13/14 1824 Oral     SpO2 08/13/14 1824 100 %     Weight 08/13/14 1824 269 lb (122.018 kg)     Height 08/13/14 1824 5\' 7"  (1.702 m)     Head Cir --      Peak Flow --      Pain Score 08/13/14 1824 9     Pain Loc --      Pain Edu? --      Excl. in [place]? --     Constitutional: Alert and oriented. Well appearing and in no acute distress. Eyes: Conjunctivae are normal. PERRL. EOMI. Head: Atraumatic. Nose: No congestion/rhinnorhea.  Mouth/Throat: Mucous membranes are moist.  Oropharynx non-erythematous. Neck:  supple Cardiovascular: Normal rate, regular rhythm. Grossly normal heart sounds.  Good peripheral circulation. Respiratory: Normal respiratory effort.  No retractions. Lungs CTAB. Gastrointestinal: Soft and nontender. No distention. No abdominal bruits. No CVA tenderness. Musculoskeletal: No lower extremity tenderness nor edema.  No joint effusions. Range of motion intact to the right ankle and right knee. She is tender over the right trochanteric bursa and the right paralumbar muscles. He has left back pain with right straight leg raise. Neurologic:  Normal speech and language. No gross focal neurologic deficits are appreciated. Speech is normal. No gait instability. Skin:  Skin is warm, dry and intact. No rash noted. Psychiatric: Mood and affect are normal. Speech and behavior are normal.  ____________________________________________   LABS (all labs ordered are listed, but only abnormal results are displayed)  Labs Reviewed - No data to display ____________________________________________  EKG   ____________________________________________  RADIOLOGY  EXAM: LUMBAR SPINE - 2-3 VIEW  COMPARISON: CT of the lumbar spine performed 04/04/2005  FINDINGS: There is no evidence of fracture or subluxation. Vertebral bodies demonstrate normal height and alignment. Intervertebral disc spaces are preserved. The visualized neural foramina are grossly unremarkable in appearance.  The visualized bowel gas pattern is unremarkable in appearance; air and stool are noted within the colon. The sacroiliac joints are within normal limits. An intrauterine device is noted overlying the mid pelvis.  IMPRESSION: No evidence of fracture or subluxation along the lumbar spine.   Electronically Signed  By: Garald Balding M.D. ____________________________________________   PROCEDURES  Procedure(s) performed: None  Critical Care performed:  No  ____________________________________________   INITIAL IMPRESSION / ASSESSMENT AND PLAN / ED COURSE  Pertinent labs & imaging results that were available during my care of the patient were reviewed by me and considered in my medical decision making (see chart for details).  39 year old female with acute on chronic low back pain including right sciatica. She was treated with prednisone and hydrocodone by her primary physician last week. Slight improvement. Plan to repeat prednisone taper and increased Norco to 2 tablets every 6 hours when necessary can follow up with her physician or the orthopedist if not improving. ____________________________________________   FINAL CLINICAL IMPRESSION(S) / ED DIAGNOSES  Final diagnoses:  Sciatica, right      Mortimer Fries, PA-C 08/13/14 2107  Nance Pear, MD 08/13/14 2232

## 2014-08-13 NOTE — Discharge Instructions (Signed)
Back Exercises Back exercises help treat and prevent back injuries. The goal is to increase your strength in your belly (abdominal) and back muscles. These exercises can also help with flexibility. Start these exercises when told by your doctor. HOME CARE Back exercises include: Pelvic Tilt.  Lie on your back with your knees bent. Tilt your pelvis until the lower part of your back is against the floor. Hold this position 5 to 10 sec. Repeat this exercise 5 to 10 times. Knee to Chest.  Pull 1 knee up against your chest and hold for 20 to 30 seconds. Repeat this with the other knee. This may be done with the other leg straight or bent, whichever feels better. Then, pull both knees up against your chest. Sit-Ups or Curl-Ups.  Bend your knees 90 degrees. Start with tilting your pelvis, and do a partial, slow sit-up. Only lift your upper half 30 to 45 degrees off the floor. Take at least 2 to 3 seonds for each sit-up. Do not do sit-ups with your knees out straight. If partial sit-ups are difficult, simply do the above but with only tightening your belly (abdominal) muscles and holding it as told. Hip-Lift.  Lie on your back with your knees flexed 90 degrees. Push down with your feet and shoulders as you raise your hips 2 inches off the floor. Hold for 10 seconds, repeat 5 to 10 times. Back Arches.  Lie on your stomach. Prop yourself up on bent elbows. Slowly press on your hands, causing an arch in your low back. Repeat 3 to 5 times. Shoulder-Lifts.  Lie face down with arms beside your body. Keep hips and belly pressed to floor as you slowly lift your head and shoulders off the floor. Do not overdo your exercises. Be careful in the beginning. Exercises may cause you some mild back discomfort. If the pain lasts for more than 15 minutes, stop the exercises until you see your doctor. Improvement with exercise for back problems is slow.  Document Released: 03/18/2010 Document Revised: 05/08/2011  Document Reviewed: 12/15/2010 [facility] Patient Information 2015 [place], [place]. This information is not intended to replace advice given to you by your health care provider. Make sure you discuss any questions you have with your health care provider.  Sciatica Sciatica is pain, weakness, numbness, or tingling along the path of the sciatic nerve. The nerve starts in the lower back and runs down the back of each leg. The nerve controls the muscles in the lower leg and in the back of the knee, while also providing sensation to the back of the thigh, lower leg, and the sole of your foot. Sciatica is a symptom of another medical condition. For instance, nerve damage or certain conditions, such as a herniated disk or bone spur on the spine, pinch or put pressure on the sciatic nerve. This causes the pain, weakness, or other sensations normally associated with sciatica. Generally, sciatica only affects one side of the body. CAUSES   Herniated or slipped disc.  Degenerative disk disease.  A pain disorder involving the narrow muscle in the buttocks (piriformis syndrome).  Pelvic injury or fracture.  Pregnancy.  Tumor (rare). SYMPTOMS  Symptoms can vary from mild to very severe. The symptoms usually travel from the low back to the buttocks and down the back of the leg. Symptoms can include:  Mild tingling or dull aches in the lower back, leg, or hip.  Numbness in the back of the calf or sole of the foot.  Burning  sensations in the lower back, leg, or hip.  Sharp pains in the lower back, leg, or hip.  Leg weakness.  Severe back pain inhibiting movement. These symptoms may get worse with coughing, sneezing, laughing, or prolonged sitting or standing. Also, being overweight may worsen symptoms. DIAGNOSIS  Your caregiver will perform a physical exam to look for common symptoms of sciatica. He or she may ask you to do certain movements or activities that would trigger sciatic nerve pain. Other  tests may be performed to find the cause of the sciatica. These may include:  Blood tests.  X-rays.  Imaging tests, such as an MRI or CT scan. TREATMENT  Treatment is directed at the cause of the sciatic pain. Sometimes, treatment is not necessary and the pain and discomfort goes away on its own. If treatment is needed, your caregiver may suggest:  Over-the-counter medicines to relieve pain.  Prescription medicines, such as anti-inflammatory medicine, muscle relaxants, or narcotics.  Applying heat or ice to the painful area.  Steroid injections to lessen pain, irritation, and inflammation around the nerve.  Reducing activity during periods of pain.  Exercising and stretching to strengthen your abdomen and improve flexibility of your spine. Your caregiver may suggest losing weight if the extra weight makes the back pain worse.  Physical therapy.  Surgery to eliminate what is pressing or pinching the nerve, such as a bone spur or part of a herniated disk. HOME CARE INSTRUCTIONS   Only take over-the-counter or prescription medicines for pain or discomfort as directed by your caregiver.  Apply ice to the affected area for 20 minutes, 3-4 times a day for the first 48-72 hours. Then try heat in the same way.  Exercise, stretch, or perform your usual activities if these do not aggravate your pain.  Attend physical therapy sessions as directed by your caregiver.  Keep all follow-up appointments as directed by your caregiver.  Do not wear high heels or shoes that do not provide proper support.  Check your mattress to see if it is too soft. A firm mattress may lessen your pain and discomfort. SEEK IMMEDIATE MEDICAL CARE IF:   You lose control of your bowel or bladder (incontinence).  You have increasing weakness in the lower back, pelvis, buttocks, or legs.  You have redness or swelling of your back.  You have a burning sensation when you urinate.  You have pain that gets  worse when you lie down or awakens you at night.  Your pain is worse than you have experienced in the past.  Your pain is lasting longer than 4 weeks.  You are suddenly losing weight without reason. MAKE SURE YOU:  Understand these instructions.  Will watch your condition.  Will get help right away if you are not doing well or get worse. Document Released: 02/07/2001 Document Revised: 08/15/2011 Document Reviewed: 06/25/2011 [facility] Patient Information 2015 [place], [place]. This information is not intended to replace advice given to you by your health care provider. Make sure you discuss any questions you have with your health care provider.   Take pain medicine as directed. Begin exercises when pain improves. Follow-up with your physician or the orthopedist for further evaluation.

## 2014-08-14 ENCOUNTER — Other Ambulatory Visit: Payer: Self-pay | Admitting: Nurse Practitioner

## 2014-08-14 ENCOUNTER — Telehealth: Payer: Self-pay | Admitting: *Deleted

## 2014-08-14 DIAGNOSIS — M545 Low back pain: Secondary | ICD-10-CM

## 2014-08-14 NOTE — Telephone Encounter (Signed)
Spoke with pt, advised of Doss' message.

## 2014-08-14 NOTE — Telephone Encounter (Signed)
Placed order for MRI. Reviewed X-ray of lumbar spine. Will not be available for the next week to follow up. See ortho if needed.

## 2014-08-14 NOTE — Telephone Encounter (Signed)
Pt called states she was seen in both Acute Care and ER on yesterday for Chronic back pain.  States she was given another Prednisone taper.  Further states the ER MD suggested she have her PCP order an MRI.  Please advise MRI

## 2014-08-27 ENCOUNTER — Ambulatory Visit: Payer: Managed Care, Other (non HMO) | Admitting: Nurse Practitioner

## 2014-09-03 ENCOUNTER — Ambulatory Visit
Admission: RE | Admit: 2014-09-03 | Discharge: 2014-09-03 | Disposition: A | Discharge status: Home / Self Care | Payer: Managed Care, Other (non HMO) | Source: Ambulatory Visit | Attending: Nurse Practitioner | Admitting: Nurse Practitioner

## 2014-09-03 DIAGNOSIS — G8929 Other chronic pain: Secondary | ICD-10-CM | POA: Diagnosis present

## 2014-09-03 DIAGNOSIS — R200 Anesthesia of skin: Secondary | ICD-10-CM | POA: Insufficient documentation

## 2014-09-03 DIAGNOSIS — M4807 Spinal stenosis, lumbosacral region: Secondary | ICD-10-CM | POA: Diagnosis not present

## 2014-09-03 DIAGNOSIS — M545 Low back pain: Secondary | ICD-10-CM | POA: Insufficient documentation

## 2014-09-06 ENCOUNTER — Encounter: Payer: Self-pay | Admitting: Nurse Practitioner

## 2014-09-07 ENCOUNTER — Other Ambulatory Visit: Payer: Self-pay | Admitting: Nurse Practitioner

## 2014-09-07 ENCOUNTER — Encounter: Payer: Self-pay | Admitting: Nurse Practitioner

## 2014-09-07 DIAGNOSIS — M5440 Lumbago with sciatica, unspecified side: Secondary | ICD-10-CM

## 2014-09-15 ENCOUNTER — Other Ambulatory Visit: Payer: Self-pay | Admitting: Nurse Practitioner

## 2014-09-15 ENCOUNTER — Telehealth: Payer: Self-pay

## 2014-09-15 DIAGNOSIS — R350 Frequency of micturition: Secondary | ICD-10-CM

## 2014-09-15 NOTE — Telephone Encounter (Signed)
-----   Message from Rubbie Battiest, NP sent at 09/15/2014 12:18 PM EDT ----- Regarding: RE: appt  Keep the appointment if she has other things to discuss. She can cancel if she wants. Threasa Beards- give her a call and ask if she still wants to keep it or wait. I am willing to discuss anything if she needs, but I don't want her to waste her time.  Thanks,  Morey Hummingbird ----- Message -----    From: Eustace Pen    Sent: 09/14/2014   4:33 PM      To: Rubbie Battiest, NP Subject: appt                                           Bret wants to know if she needs to keep the appt that she has scheduled with you on Wednesday or wait till she sees the neurosurgeon and urology. Pt cb [phone]. Can lvm

## 2014-09-15 NOTE — Telephone Encounter (Signed)
The patient called hoping to get instructions on when she needs a follow up apt next.

## 2014-09-15 NOTE — Telephone Encounter (Signed)
Talked to pt about appt, it was cancelled and she is going to wait for the referrals and follow up after the appts

## 2014-09-15 NOTE — Telephone Encounter (Signed)
LMTCB about appt

## 2014-09-16 ENCOUNTER — Ambulatory Visit: Payer: Managed Care, Other (non HMO) | Admitting: Nurse Practitioner

## 2014-09-16 ENCOUNTER — Telehealth: Payer: Self-pay

## 2014-09-16 NOTE — Telephone Encounter (Signed)
-----   Message from Rubbie Battiest, NP sent at 09/15/2014 12:18 PM EDT ----- Regarding: RE: appt  Keep the appointment if she has other things to discuss. She can cancel if she wants. Threasa Beards- give her a call and ask if she still wants to keep it or wait. I am willing to discuss anything if she needs, but I don't want her to waste her time.  Thanks,  Morey Hummingbird ----- Message -----    From: Eustace Pen    Sent: 09/14/2014   4:33 PM      To: Rubbie Battiest, NP Subject: appt                                           Alfonso wants to know if she needs to keep the appt that she has scheduled with you on Wednesday or wait till she sees the neurosurgeon and urology. Pt cb [phone]. Can lvm

## 2014-09-16 NOTE — Telephone Encounter (Signed)
Talked to pt on 6.19.16 and the appt had already been cancelled

## 2014-10-07 ENCOUNTER — Other Ambulatory Visit: Payer: Self-pay | Admitting: Neurosurgery

## 2014-10-07 DIAGNOSIS — M5412 Radiculopathy, cervical region: Secondary | ICD-10-CM

## 2014-10-15 ENCOUNTER — Ambulatory Visit
Admission: RE | Admit: 2014-10-15 | Discharge: 2014-10-15 | Disposition: A | Discharge status: Home / Self Care | Payer: Managed Care, Other (non HMO) | Source: Ambulatory Visit | Attending: Neurosurgery | Admitting: Neurosurgery

## 2014-10-15 DIAGNOSIS — M5412 Radiculopathy, cervical region: Secondary | ICD-10-CM

## 2014-10-15 DIAGNOSIS — M79601 Pain in right arm: Secondary | ICD-10-CM | POA: Insufficient documentation

## 2014-10-19 ENCOUNTER — Encounter: Payer: Self-pay | Admitting: Nurse Practitioner

## 2014-11-03 ENCOUNTER — Ambulatory Visit: Payer: Self-pay

## 2014-11-12 ENCOUNTER — Ambulatory Visit: Payer: Self-pay | Admitting: Urology

## 2015-01-14 ENCOUNTER — Telehealth: Payer: Self-pay

## 2015-01-14 ENCOUNTER — Emergency Department: Payer: Managed Care, Other (non HMO)

## 2015-01-14 ENCOUNTER — Encounter: Payer: Self-pay | Admitting: Emergency Medicine

## 2015-01-14 ENCOUNTER — Emergency Department
Admission: EM | Admit: 2015-01-14 | Discharge: 2015-01-14 | Disposition: A | Discharge status: Home / Self Care | Payer: Managed Care, Other (non HMO) | Attending: Emergency Medicine | Admitting: Emergency Medicine

## 2015-01-14 DIAGNOSIS — R1032 Left lower quadrant pain: Secondary | ICD-10-CM | POA: Diagnosis present

## 2015-01-14 DIAGNOSIS — I10 Essential (primary) hypertension: Secondary | ICD-10-CM | POA: Diagnosis not present

## 2015-01-14 DIAGNOSIS — R110 Nausea: Secondary | ICD-10-CM

## 2015-01-14 DIAGNOSIS — N83201 Unspecified ovarian cyst, right side: Secondary | ICD-10-CM | POA: Diagnosis not present

## 2015-01-14 DIAGNOSIS — Z79899 Other long term (current) drug therapy: Secondary | ICD-10-CM | POA: Diagnosis not present

## 2015-01-14 DIAGNOSIS — T8339XA Other mechanical complication of intrauterine contraceptive device, initial encounter: Secondary | ICD-10-CM | POA: Diagnosis not present

## 2015-01-14 DIAGNOSIS — K5900 Constipation, unspecified: Secondary | ICD-10-CM | POA: Diagnosis not present

## 2015-01-14 DIAGNOSIS — Z880 Allergy status to penicillin: Secondary | ICD-10-CM | POA: Insufficient documentation

## 2015-01-14 DIAGNOSIS — E669 Obesity, unspecified: Secondary | ICD-10-CM | POA: Insufficient documentation

## 2015-01-14 DIAGNOSIS — Z7951 Long term (current) use of inhaled steroids: Secondary | ICD-10-CM | POA: Insufficient documentation

## 2015-01-14 DIAGNOSIS — Z792 Long term (current) use of antibiotics: Secondary | ICD-10-CM | POA: Insufficient documentation

## 2015-01-14 DIAGNOSIS — Y658 Other specified misadventures during surgical and medical care: Secondary | ICD-10-CM | POA: Diagnosis not present

## 2015-01-14 DIAGNOSIS — Z3202 Encounter for pregnancy test, result negative: Secondary | ICD-10-CM | POA: Insufficient documentation

## 2015-01-14 LAB — URINALYSIS COMPLETE WITH MICROSCOPIC (ARMC ONLY)
Bilirubin Urine: NEGATIVE
GLUCOSE, UA: NEGATIVE mg/dL
Hgb urine dipstick: NEGATIVE
KETONES UR: NEGATIVE mg/dL
LEUKOCYTES UA: NEGATIVE
NITRITE: NEGATIVE
Protein, ur: NEGATIVE mg/dL
Specific Gravity, Urine: 1.01 (ref 1.005–1.030)
pH: 8 (ref 5.0–8.0)

## 2015-01-14 LAB — PREGNANCY, URINE: PREG TEST UR: NEGATIVE

## 2015-01-14 LAB — COMPREHENSIVE METABOLIC PANEL
ALK PHOS: 54 U/L (ref 38–126)
ALT: 35 U/L (ref 14–54)
ANION GAP: 4 — AB (ref 5–15)
AST: 30 U/L (ref 15–41)
Albumin: 4.4 g/dL (ref 3.5–5.0)
BILIRUBIN TOTAL: 0.8 mg/dL (ref 0.3–1.2)
BUN: 14 mg/dL (ref 6–20)
CALCIUM: 9.6 mg/dL (ref 8.9–10.3)
CO2: 30 mmol/L (ref 22–32)
Chloride: 105 mmol/L (ref 101–111)
Creatinine, Ser: 0.49 mg/dL (ref 0.44–1.00)
GFR calc Af Amer: >60 mL/min (ref >60)
GLUCOSE: 96 mg/dL (ref 65–99)
POTASSIUM: 4.5 mmol/L (ref 3.5–5.1)
Sodium: 139 mmol/L (ref 135–145)
TOTAL PROTEIN: 8.2 g/dL — AB (ref 6.5–8.1)

## 2015-01-14 LAB — CBC
HEMATOCRIT: 41.5 % (ref 35.0–47.0)
Hemoglobin: 13.9 g/dL (ref 12.0–16.0)
MCH: 29.9 pg (ref 26.0–34.0)
MCHC: 33.4 g/dL (ref 32.0–36.0)
MCV: 89.5 fL (ref 80.0–100.0)
Platelets: 172 10*3/uL (ref 150–440)
RBC: 4.64 MIL/uL (ref 3.80–5.20)
RDW: 14.1 % (ref 11.5–14.5)
WBC: 4.9 10*3/uL (ref 3.6–11.0)

## 2015-01-14 MED ORDER — ONDANSETRON HCL 4 MG/2ML IJ SOLN
INTRAMUSCULAR | Status: AC
  Administered 2015-01-14: 4 mg via INTRAVENOUS
  Filled 2015-01-14: qty 2

## 2015-01-14 MED ORDER — OXYCODONE HCL 20 MG/ML PO CONC: 10.0000 mg | Freq: Three times a day (TID) | ORAL | Status: DC | PRN

## 2015-01-14 MED ORDER — ONDANSETRON HCL 4 MG/2ML IJ SOLN
4.0000 mg | Freq: Once | INTRAMUSCULAR | Status: AC
  Administered 2015-01-14: 4 mg via INTRAVENOUS

## 2015-01-14 MED ORDER — ONDANSETRON 4 MG PO TBDP: 4.0000 mg | ORAL_TABLET | Freq: Once | ORAL | Status: DC

## 2015-01-14 MED ORDER — HYDROMORPHONE HCL 1 MG/ML IJ SOLN
INTRAMUSCULAR | Status: AC
  Filled 2015-01-14: qty 1

## 2015-01-14 MED ORDER — OXYCODONE HCL 20 MG/ML PO CONC
10.0000 mg | Freq: Once | ORAL | Status: AC
  Administered 2015-01-14: 10 mg via ORAL
  Filled 2015-01-14: qty 1

## 2015-01-14 MED ORDER — IOHEXOL 240 MG/ML SOLN
25.0000 mL | Freq: Once | INTRAMUSCULAR | Status: AC | PRN
  Administered 2015-01-14: 25 mL via ORAL
  Filled 2015-01-14: qty 25

## 2015-01-14 MED ORDER — SODIUM CHLORIDE 0.9 % IV BOLUS (SEPSIS)
1000.0000 mL | Freq: Once | INTRAVENOUS | Status: AC
  Administered 2015-01-14: 1000 mL via INTRAVENOUS

## 2015-01-14 MED ORDER — IOHEXOL 300 MG/ML  SOLN
100.0000 mL | Freq: Once | INTRAMUSCULAR | Status: AC | PRN
  Administered 2015-01-14: 100 mL via INTRAVENOUS
  Filled 2015-01-14: qty 100

## 2015-01-14 MED ORDER — ONDANSETRON 4 MG PO TBDP
4.0000 mg | ORAL_TABLET | Freq: Once | ORAL | Status: AC
  Administered 2015-01-14: 4 mg via ORAL
  Filled 2015-01-14: qty 1

## 2015-01-14 MED ORDER — HYDROMORPHONE HCL 1 MG/ML IJ SOLN
1.0000 mg | Freq: Once | INTRAMUSCULAR | Status: AC
  Administered 2015-01-14: 1 mg via INTRAVENOUS

## 2015-01-14 NOTE — ED Notes (Signed)
C/o LLQ abd. Pain with nausea since Sunday night

## 2015-01-14 NOTE — ED Notes (Signed)
AAOx3.  Skin warm and dry.  NAD. Ambulates with easy and steady gait.  Posture relaxed.

## 2015-01-14 NOTE — Telephone Encounter (Signed)
Pt called in regards to having left side pain and nausea for several days. I advised patient that she should go to the ER since the pain level for at a 8 and that it may be something serious. Pt said she would call back if she decides to scheduled appt or go the ER.

## 2015-01-14 NOTE — ED Notes (Signed)
AAOx3.  Skin warm and dry.  NAD 

## 2015-01-14 NOTE — Discharge Instructions (Signed)
Please make an appointment with an OB/GYN to have your IUD reevaluated. Your OB/GYN will also follow up your right ovarian cyst.  Please make an appointment with your primary care physician.  Please take Tylenol for mild to moderate pain, and oxycodone for severe pain. You may take Zofran for nausea.  Please return to the emergency department if you develop severe pain, inability to keep down fluids, fever, or any other symptoms concerning to you.  Abdominal Pain, Adult Many things can cause abdominal pain. Usually, abdominal pain is not caused by a disease and will improve without treatment. It can often be observed and treated at home. Your health care provider will do a physical exam and possibly order blood tests and X-rays to help determine the seriousness of your pain. However, in many cases, more time must pass before a clear cause of the pain can be found. Before that point, your health care provider may not know if you need more testing or further treatment. HOME CARE INSTRUCTIONS Monitor your abdominal pain for any changes. The following actions may help to alleviate any discomfort you are experiencing:  Only take over-the-counter or prescription medicines as directed by your health care provider.  Do not take laxatives unless directed to do so by your health care provider.  Try a clear liquid diet (broth, tea, or water) as directed by your health care provider. Slowly move to a bland diet as tolerated. SEEK MEDICAL CARE IF:  You have unexplained abdominal pain.  You have abdominal pain associated with nausea or diarrhea.  You have pain when you urinate or have a bowel movement.  You experience abdominal pain that wakes you in the night.  You have abdominal pain that is worsened or improved by eating food.  You have abdominal pain that is worsened with eating fatty foods.  You have a fever. SEEK IMMEDIATE MEDICAL CARE IF:  Your pain does not go away within 2 hours.  You  keep throwing up (vomiting).  Your pain is felt only in portions of the abdomen, such as the right side or the left lower portion of the abdomen.  You pass bloody or black tarry stools. MAKE SURE YOU:  Understand these instructions.  Will watch your condition.  Will get help right away if you are not doing well or get worse.   This information is not intended to replace advice given to you by your health care provider. Make sure you discuss any questions you have with your health care provider.   Document Released: 11/23/2004 Document Revised: 11/04/2014 Document Reviewed: 10/23/2012 Elsevier Interactive Patient Education [employer].

## 2015-01-14 NOTE — ED Provider Notes (Signed)
[facility] Emergency Department Provider Note  ____________________________________________  Time seen: Approximately 3:33 PM  I have reviewed the triage vital signs and the nursing notes.   HISTORY  Chief Complaint Abdominal Pain    HPI Jill Hawkins is a 39 y.o. female with a history of recurrent UTIs, renal colic, GERD, and morbid obesity presenting with left lower quadrant pain. Patient states that her symptoms started 4-5 days ago with a "dull" ache in the left lower quadrant. Since then, her pain has worsened and has been associated with nausea but no vomiting. Last bowel movement was yesterday which was normal. She is not been having any fever, chills, urinary symptoms, change in vaginal discharge. She has not been sexually active for several months. She has associated anorexia.   Past Medical History  Diagnosis Date  . History of fainting spells of unknown cause   . Frequent headaches   . GERD (gastroesophageal reflux disease)   . Hypertension   . Kidney stones   . Migraine   . UTI (lower urinary tract infection)     Patient Active Problem List   Diagnosis Date Noted  . Back pain 06/22/2014  . Urinary frequency 06/22/2014  . Encounter for routine gynecological examination 04/25/2014  . Right cervical radiculopathy 04/25/2014  . Left knee pain 04/25/2014  . Sinusitis, acute maxillary 03/24/2014    Past Surgical History  Procedure Laterality Date  . Cholecystectomy  2015  . Gastric bypass  04/14/13  . Growth plate surgery  [id number]    Right hip  . Carpal tunnel release  2005    Current Outpatient Rx  Name  Route  Sig  Dispense  Refill  . ALPRAZolam (XANAX) 1 MG tablet   Oral   Take 1 mg by mouth as needed for anxiety.         Marland Kitchen amLODipine (NORVASC) 5 MG tablet   Oral   Take 5 mg by mouth daily.         . chlorpheniramine-HYDROcodone (TUSSIONEX PENNKINETIC ER) 10-8 MG/5ML LQCR   Oral   Take 5 mLs by mouth at bedtime as  needed for cough.   115 mL   0   . cyanocobalamin (,VITAMIN B-12,) 1000 MCG/ML injection   Intramuscular   Inject 1,000 mcg into the muscle every 30 (thirty) days.         . cyclobenzaprine (FLEXERIL) 10 MG tablet   Oral   Take 10 mg by mouth 3 (three) times daily as needed for muscle spasms.         . Dexlansoprazole 30 MG capsule   Oral   Take 30 mg by mouth daily.         . diclofenac sodium (VOLTAREN) 1 % GEL   Topical   Apply 4 g topically as needed.         . doxycycline (VIBRA-TABS) 100 MG tablet   Oral   Take 1 tablet (100 mg total) by mouth 2 (two) times daily.   14 tablet   0   . fluticasone (FLONASE) 50 MCG/ACT nasal spray   Each Nare   Place 2 sprays into both nostrils daily.   16 g   6   . folic acid (FOLVITE) 1 MG tablet   Oral   Take 1 mg by mouth daily.         Marland Kitchen gabapentin (NEURONTIN) 400 MG capsule   Oral   Take 1 capsule (400 mg total) by mouth at bedtime.  30 capsule   1   . HYDROcodone-acetaminophen (NORCO) 5-325 MG per tablet   Oral   Take 2 tablets by mouth every 6 (six) hours as needed for moderate pain.   30 tablet   0   . methocarbamol (ROBAXIN) 750 MG tablet   Oral   Take 750 mg by mouth 2 (two) times daily.         Marland Kitchen nystatin-triamcinolone ointment (MYCOLOG)   Topical   Apply 1 application topically 2 (two) times daily.   30 g   0   . ondansetron (ZOFRAN-ODT) 4 MG disintegrating tablet   Oral   Take 1 tablet (4 mg total) by mouth once.   20 tablet   0   . oxyCODONE (ROXICODONE INTENSOL) 20 MG/ML concentrated solution   Oral   Take 0.5 mLs (10 mg total) by mouth every 8 (eight) hours as needed for severe pain.   5 mL   0   . predniSONE (STERAPRED UNI-PAK 21 TAB) 10 MG (21) TBPK tablet      6 tablets on day 1, 5 tablets on day 2, 4 tablets on day 3, etc...   21 tablet   0   . pseudoephedrine (SUDAFED) 30 MG tablet   Oral   Take 30 mg by mouth every 8 (eight) hours as needed for congestion (OTC).          Marland Kitchen sucralfate (CARAFATE) 1 GM/10ML suspension   Oral   Take 1 g by mouth 4 (four) times daily.         . SUMAtriptan (IMITREX) 100 MG tablet   Oral   Take 100 mg by mouth as needed for migraine or headache. May repeat in 2 hours if headache persists or recurs.           Allergies Penicillins; Oxycodone-acetaminophen; Tramadol; Codeine; and Tape  Family History  Problem Relation Age of Onset  . Arthritis Mother   . Hypertension Mother   . Mental illness Mother   . Diabetes Mother   . Alcohol abuse Father   . Cancer Father   . Hypertension Father   . Mental illness Brother   . Heart disease Maternal Grandmother   . Hypertension Maternal Grandmother   . Mental illness Maternal Grandmother   . Diabetes Maternal Grandmother   . Cancer Maternal Grandfather   . Hypertension Maternal Grandfather   . Mental illness Paternal Grandmother     Social History Social History  Substance Use Topics  . Smoking status: Never Smoker   . Smokeless tobacco: Never Used  . Alcohol Use: No    Review of Systems Constitutional: No fever/chills. Eyes: No visual changes. ENT: No sore throat. Cardiovascular: Denies chest pain, palpitations. Respiratory: Denies shortness of breath.  No cough. Gastrointestinal: Positive left lower quadrant abdominal pain.  Positive nausea, no vomiting.  No diarrhea.  No constipation. Positive anorexia Genitourinary: Negative for dysuria. Negative for change in vaginal discharge. Musculoskeletal: Negative for back pain. Skin: Negative for rash. Neurological: Negative for headaches, focal weakness or numbness.  10-point ROS otherwise negative.  ____________________________________________   PHYSICAL EXAM:  VITAL SIGNS: ED Triage Vitals  Enc Vitals Group     BP 01/14/15 1428 111/75 mmHg     Pulse Rate 01/14/15 1428 62     Resp 01/14/15 1428 18     Temp 01/14/15 1428 97.7 F (36.5 C)     Temp Source 01/14/15 1428 Oral     SpO2 01/14/15 1428  100 %  Weight 01/14/15 1428 250 lb (113.399 kg)     Height 01/14/15 1428 5\' 7"  (1.702 m)     Head Cir --      Peak Flow --      Pain Score 01/14/15 1428 7     Pain Loc --      Pain Edu? --      Excl. in [place]? --     Constitutional: Alert and oriented. Mildly uncomfortable appearing and in no acute distress. Answers questions appropriately. Eyes: Conjunctivae are normal.  EOMI. Head: Atraumatic. Nose: No congestion/rhinnorhea. Mouth/Throat: Mucous membranes are moist.  Neck: No stridor.  Supple.   Cardiovascular: Normal rate, regular rhythm. No murmurs, rubs or gallops.  Respiratory: Normal respiratory effort.  No retractions. Lungs CTAB.  No wheezes, rales or ronchi. Gastrointestinal: Abdomen is obese, soft, nondistended. She does have mild tenderness to palpation in the left lower quadrant without any guarding, rebound, or peritoneal signs.  Musculoskeletal: No LE edema.  Neurologic:  Normal speech and language. No gross focal neurologic deficits are appreciated.  Skin:  Skin is warm, dry and intact. No rash noted. Psychiatric: Mood and affect are normal. Speech and behavior are normal.  Normal judgement. ____________________________________________   LABS (all labs ordered are listed, but only abnormal results are displayed)  Labs Reviewed  COMPREHENSIVE METABOLIC PANEL - Abnormal; Notable for the following:    Total Protein 8.2 (*)    Anion gap 4 (*)    All other components within normal limits  URINALYSIS COMPLETEWITH MICROSCOPIC ([facility] ONLY) - Abnormal; Notable for the following:    Color, Urine YELLOW (*)    APPearance CLEAR (*)    Bacteria, UA RARE (*)    Squamous Epithelial / LPF 0-5 (*)    All other components within normal limits  CBC  PREGNANCY, URINE  POC URINE PREG, ED   ____________________________________________  EKG  Not indicated ____________________________________________  RADIOLOGY  Ct Abdomen Pelvis W Contrast  01/14/2015  CLINICAL DATA:   Left lower quadrant pain, nausea EXAM: CT ABDOMEN AND PELVIS WITH CONTRAST TECHNIQUE: Multidetector CT imaging of the abdomen and pelvis was performed using the standard protocol following bolus administration of intravenous contrast. CONTRAST:  149mL OMNIPAQUE IOHEXOL 300 MG/ML  SOLN COMPARISON:  None. FINDINGS: Lower chest:  Lung bases are clear. Hepatobiliary: Unenhanced liver is unremarkable. Status post cholecystectomy. No intrahepatic or extrahepatic ductal dilatation. Pancreas: Within normal limits. Spleen: Within normal limits. Adrenals/Urinary Tract: Adrenal glands are within normal limits. Kidneys are within normal limits.  No hydronephrosis. Bladder is within normal limits. Stomach/Bowel: Postsurgical changes related to prior gastric bypass. Patent gastrojejunostomy with contrast within mid/distal small bowel. No evidence of bowel obstruction. Normal appendix (series 2/ image 55). Mild to moderate left colonic stool burden. Vascular/Lymphatic: No evidence of abdominal aortic aneurysm. No suspicious abdominopelvic lymphadenopathy. Reproductive: Uterus is notable for a malpositioned, inverted IUD with its stem embedded in the myometrium of the left anterior uterine fundus (series 2/image 81). Left ovary is within normal limits. Right ovary is notable for a 5.5 cm cyst. Other: No pelvic ascites. Musculoskeletal: Mild degenerative changes of the visualized thoracolumbar spine. IMPRESSION: Status post gastric bypass with patent gastrojejunostomy. No evidence of bowel obstruction.  Normal appendix. Mild to moderate left colonic stool burden, raising the possibility constipation. Malpositioned IUD, as described above. OB-GYN consultation is suggested for possible removal. 5.5 cm right ovarian cyst, likely physiologic. Follow-up pelvic ultrasound is suggested in 6-10 weeks. Electronically Signed   By: Henderson Newcomer.D.  On: 01/14/2015 17:50     ____________________________________________   PROCEDURES  Procedure(s) performed: None  Critical Care performed: No ____________________________________________   INITIAL IMPRESSION / ASSESSMENT AND PLAN / ED COURSE  Pertinent labs & imaging results that were available during my care of the patient were reviewed by me and considered in my medical decision making (see chart for details).  39 y.o. female with morbid obesity, history of renal colic, history of recurrent UTI presenting with 5 days of progressively worsening left lower quadrant pain with nausea but no other systemic symptoms. On my exam her pain appears to be higher than the pelvis. I would consider diverticulitis, renal colic, UTI or pyelonephritis. We will start with a CT scan, if it is negative consider pelvic exam with ultrasound to evaluate for ovarian pathology including cyst. Ovarian torsion is much less likely.  ----------------------------------------- 5:13 PM on 01/14/2015 -----------------------------------------  The patient was able to tolerate her by mouth contrast without vomiting. She is continuing to have pain so I have written for a second dose of Dilaudid. I am awaiting her CT scan for further disposition.  ----------------------------------------- 6:07 PM on 01/14/2015 -----------------------------------------  The patient CT scan does show large stool burden in the left colon. The patient's that she is having regular bowel movements, but it is possible that her discomfort is from this. I will plan to discharge her with a daily stool softener and attempt to decrease her stool burden. On the CT scan she also has a 5.5 cm right ovarian cyst, given her pain on the left side, it is unlikely that the cyst is the cause of her pain. Having the cyst does increase her risk of ovarian torsion, but given the history that she is describing ovarian torsion is very unlikely. The patient also has an IUD which is  out of place; she states that she has known about this in the past but will follow up with her gynecologist at [facility] to have this reevaluated. ____________________________________________  FINAL CLINICAL IMPRESSION(S) / ED DIAGNOSES  Final diagnoses:  Left lower quadrant pain  Nausea without vomiting  Constipation, unspecified constipation type  Other mechanical complication of intrauterine contraceptive device, initial encounter  Ovarian cyst, right      NEW MEDICATIONS STARTED DURING THIS VISIT:  New Prescriptions   ONDANSETRON (ZOFRAN-ODT) 4 MG DISINTEGRATING TABLET    Take 1 tablet (4 mg total) by mouth once.   OXYCODONE (ROXICODONE INTENSOL) 20 MG/ML CONCENTRATED SOLUTION    Take 0.5 mLs (10 mg total) by mouth every 8 (eight) hours as needed for severe pain.     Eula Listen, MD 01/14/15 [phone]

## 2015-01-14 NOTE — ED Notes (Signed)
I talked to the lady over urinalysis in the lab and she told me that they could do a urine pregnancy on the urine that they already had in the lab on this patient. I think her name was Jinny Blossom, but I am not certain on that name.

## 2015-02-15 DIAGNOSIS — M5116 Intervertebral disc disorders with radiculopathy, lumbar region: Secondary | ICD-10-CM | POA: Insufficient documentation

## 2015-02-17 ENCOUNTER — Other Ambulatory Visit: Payer: Self-pay | Admitting: Neurosurgery

## 2015-02-17 ENCOUNTER — Telehealth: Payer: Self-pay | Admitting: *Deleted

## 2015-02-17 DIAGNOSIS — M5126 Other intervertebral disc displacement, lumbar region: Secondary | ICD-10-CM

## 2015-02-17 NOTE — Telephone Encounter (Signed)
Please advise if she can have this done here and if so as a Nurse visit or a Office visit?

## 2015-02-17 NOTE — Telephone Encounter (Signed)
Patient is scheduled for back surgery with Dr. Wendall Stade, of [facility] 03/08/14. Patient requested her EKG, and blood work be done here at the office. If this can be done, [facility] will put the orders in. Please Advise

## 2015-02-18 NOTE — Telephone Encounter (Signed)
Please tell Tishanna that a Nurse visit for EKG is fine- blood work may be tricky. I know they can put in orders, but I may need to re-order them on our end. Schedule a Nurse visit then a lab visit on the same day please.

## 2015-02-18 NOTE — Telephone Encounter (Signed)
Pt called back and I scheduled pt for a EKG on 12/29 @11am . Pt states the other provider will sent fax over the lab order. Thank You!

## 2015-02-18 NOTE — Telephone Encounter (Signed)
Left message for patient to return phone call in regards to scheduling nurse and lab visit.

## 2015-02-24 ENCOUNTER — Other Ambulatory Visit: Payer: Self-pay | Admitting: Nurse Practitioner

## 2015-02-24 DIAGNOSIS — Z01818 Encounter for other preprocedural examination: Secondary | ICD-10-CM

## 2015-02-25 ENCOUNTER — Other Ambulatory Visit: Payer: Managed Care, Other (non HMO)

## 2015-02-25 ENCOUNTER — Telehealth: Payer: Self-pay | Admitting: *Deleted

## 2015-02-25 ENCOUNTER — Encounter: Payer: Self-pay | Admitting: *Deleted

## 2015-02-25 NOTE — Telephone Encounter (Signed)
Labs and ECG are in the system as future orders so they will not be taken out, they are there indefinitely.  Please let the patient know and we can reschedule when needed. Thanks

## 2015-02-25 NOTE — Telephone Encounter (Signed)
Patient was advised by the neurologist to cancel her appt for the labs and EKG until she speaks wit him. Patient stated that she may need this appt at a later time, and questioned how long would the orders be in the system.  Please Advise

## 2015-04-14 DIAGNOSIS — N83201 Unspecified ovarian cyst, right side: Secondary | ICD-10-CM | POA: Insufficient documentation

## 2015-05-07 ENCOUNTER — Other Ambulatory Visit: Payer: Self-pay | Admitting: Specialist

## 2015-05-10 ENCOUNTER — Other Ambulatory Visit: Payer: Self-pay | Admitting: Nurse Practitioner

## 2015-05-27 ENCOUNTER — Encounter: Payer: Managed Care, Other (non HMO) | Admitting: Nurse Practitioner

## 2015-06-17 ENCOUNTER — Encounter: Payer: Self-pay | Admitting: Nurse Practitioner

## 2015-06-17 ENCOUNTER — Ambulatory Visit (INDEPENDENT_AMBULATORY_CARE_PROVIDER_SITE_OTHER): Payer: Managed Care, Other (non HMO) | Admitting: Nurse Practitioner

## 2015-06-17 VITALS — BP 101/59 | HR 76 | Temp 98.8°F | Ht 67.5 in | Wt 272.4 lb

## 2015-06-17 DIAGNOSIS — Z0001 Encounter for general adult medical examination with abnormal findings: Secondary | ICD-10-CM | POA: Diagnosis not present

## 2015-06-17 DIAGNOSIS — J0100 Acute maxillary sinusitis, unspecified: Secondary | ICD-10-CM | POA: Diagnosis not present

## 2015-06-17 DIAGNOSIS — M545 Low back pain, unspecified: Secondary | ICD-10-CM

## 2015-06-17 DIAGNOSIS — R350 Frequency of micturition: Secondary | ICD-10-CM

## 2015-06-17 DIAGNOSIS — Z0000 Encounter for general adult medical examination without abnormal findings: Secondary | ICD-10-CM

## 2015-06-17 MED ORDER — DOXYCYCLINE HYCLATE 100 MG PO TABS: 100.0000 mg | ORAL_TABLET | Freq: Two times a day (BID) | ORAL | Status: DC

## 2015-06-17 NOTE — Patient Instructions (Signed)
Health Maintenance, Female Adopting a healthy lifestyle and getting preventive care can go a long way to promote health and wellness. Talk with your health care provider about what schedule of regular examinations is right for you. This is a good chance for you to check in with your provider about disease prevention and staying healthy. In between checkups, there are plenty of things you can do on your own. Experts have done a lot of research about which lifestyle changes and preventive measures are most likely to keep you healthy. Ask your health care provider for more information. WEIGHT AND DIET  Eat a healthy diet  Be sure to include plenty of vegetables, fruits, low-fat dairy products, and lean protein.  Do not eat a lot of foods high in solid fats, added sugars, or salt.  Get regular exercise. This is one of the most important things you can do for your health.  Most adults should exercise for at least 150 minutes each week. The exercise should increase your heart rate and make you sweat (moderate-intensity exercise).  Most adults should also do strengthening exercises at least twice a week. This is in addition to the moderate-intensity exercise.  Maintain a healthy weight  Body mass index (BMI) is a measurement that can be used to identify possible weight problems. It estimates body fat based on height and weight. Your health care provider can help determine your BMI and help you achieve or maintain a healthy weight.  For females 20 years of age and older:   A BMI below 18.5 is considered underweight.  A BMI of 18.5 to 24.9 is normal.  A BMI of 25 to 29.9 is considered overweight.  A BMI of 30 and above is considered obese.  Watch levels of cholesterol and blood lipids  You should start having your blood tested for lipids and cholesterol at 38 years of age, then have this test every 5 years.  You may need to have your cholesterol levels checked more often if:  Your lipid  or cholesterol levels are high.  You are older than 38 years of age.  You are at high risk for heart disease.  CANCER SCREENING   Lung Cancer  Lung cancer screening is recommended for adults 55-80 years old who are at high risk for lung cancer because of a history of smoking.  A yearly low-dose CT scan of the lungs is recommended for people who:  Currently smoke.  Have quit within the past 15 years.  Have at least a 30-pack-year history of smoking. A pack year is smoking an average of one pack of cigarettes a day for 1 year.  Yearly screening should continue until it has been 15 years since you quit.  Yearly screening should stop if you develop a health problem that would prevent you from having lung cancer treatment.  Breast Cancer  Practice breast self-awareness. This means understanding how your breasts normally appear and feel.  It also means doing regular breast self-exams. Let your health care provider know about any changes, no matter how small.  If you are in your 20s or 30s, you should have a clinical breast exam (CBE) by a health care provider every 1-3 years as part of a regular health exam.  If you are 40 or older, have a CBE every year. Also consider having a breast X-ray (mammogram) every year.  If you have a family history of breast cancer, talk to your health care provider about genetic screening.  If you   are at high risk for breast cancer, talk to your health care provider about having an MRI and a mammogram every year.  Breast cancer gene (BRCA) assessment is recommended for women who have family members with BRCA-related cancers. BRCA-related cancers include:  Breast.  Ovarian.  Tubal.  Peritoneal cancers.  Results of the assessment will determine the need for genetic counseling and BRCA1 and BRCA2 testing. Cervical Cancer Your health care provider may recommend that you be screened regularly for cancer of the pelvic organs (ovaries, uterus, and  vagina). This screening involves a pelvic examination, including checking for microscopic changes to the surface of your cervix (Pap test). You may be encouraged to have this screening done every 3 years, beginning at age 21.  For women ages 30-65, health care providers may recommend pelvic exams and Pap testing every 3 years, or they may recommend the Pap and pelvic exam, combined with testing for human papilloma virus (HPV), every 5 years. Some types of HPV increase your risk of cervical cancer. Testing for HPV may also be done on women of any age with unclear Pap test results.  Other health care providers may not recommend any screening for nonpregnant women who are considered low risk for pelvic cancer and who do not have symptoms. Ask your health care provider if a screening pelvic exam is right for you.  If you have had past treatment for cervical cancer or a condition that could lead to cancer, you need Pap tests and screening for cancer for at least 20 years after your treatment. If Pap tests have been discontinued, your risk factors (such as having a new sexual partner) need to be reassessed to determine if screening should resume. Some women have medical problems that increase the chance of getting cervical cancer. In these cases, your health care provider may recommend more frequent screening and Pap tests. Colorectal Cancer  This type of cancer can be detected and often prevented.  Routine colorectal cancer screening usually begins at 38 years of age and continues through 38 years of age.  Your health care provider may recommend screening at an earlier age if you have risk factors for colon cancer.  Your health care provider may also recommend using home test kits to check for hidden blood in the stool.  A small camera at the end of a tube can be used to examine your colon directly (sigmoidoscopy or colonoscopy). This is done to check for the earliest forms of colorectal  cancer.  Routine screening usually begins at age 50.  Direct examination of the colon should be repeated every 5-10 years through 38 years of age. However, you may need to be screened more often if early forms of precancerous polyps or small growths are found. Skin Cancer  Check your skin from head to toe regularly.  Tell your health care provider about any new moles or changes in moles, especially if there is a change in a mole's shape or color.  Also tell your health care provider if you have a mole that is larger than the size of a pencil eraser.  Always use sunscreen. Apply sunscreen liberally and repeatedly throughout the day.  Protect yourself by wearing long sleeves, pants, a wide-brimmed hat, and sunglasses whenever you are outside. HEART DISEASE, DIABETES, AND HIGH BLOOD PRESSURE   High blood pressure causes heart disease and increases the risk of stroke. High blood pressure is more likely to develop in:  People who have blood pressure in the high end   of the normal range (130-139/85-89 mm Hg).  People who are overweight or obese.  People who are African American.  If you are 38-23 years of age, have your blood pressure checked every 3-5 years. If you are 61 years of age or older, have your blood pressure checked every year. You should have your blood pressure measured twice--once when you are at a hospital or clinic, and once when you are not at a hospital or clinic. Record the average of the two measurements. To check your blood pressure when you are not at a hospital or clinic, you can use:  An automated blood pressure machine at a pharmacy.  A home blood pressure monitor.  If you are between 45 years and 39 years old, ask your health care provider if you should take aspirin to prevent strokes.  Have regular diabetes screenings. This involves taking a blood sample to check your fasting blood sugar level.  If you are at a normal weight and have a low risk for diabetes,  have this test once every three years after 38 years of age.  If you are overweight and have a high risk for diabetes, consider being tested at a younger age or more often. PREVENTING INFECTION  Hepatitis B  If you have a higher risk for hepatitis B, you should be screened for this virus. You are considered at high risk for hepatitis B if:  You were born in a country where hepatitis B is common. Ask your health care provider which countries are considered high risk.  Your parents were born in a high-risk country, and you have not been immunized against hepatitis B (hepatitis B vaccine).  You have HIV or AIDS.  You use needles to inject street drugs.  You live with someone who has hepatitis B.  You have had sex with someone who has hepatitis B.  You get hemodialysis treatment.  You take certain medicines for conditions, including cancer, organ transplantation, and autoimmune conditions. Hepatitis C  Blood testing is recommended for:  Everyone born from [birth year] through 1963.  Anyone with known risk factors for hepatitis C. Sexually transmitted infections (STIs)  You should be screened for sexually transmitted infections (STIs) including gonorrhea and chlamydia if:  You are sexually active and are younger than 38 years of age.  You are older than 38 years of age and your health care provider tells you that you are at risk for this type of infection.  Your sexual activity has changed since you were last screened and you are at an increased risk for chlamydia or gonorrhea. Ask your health care provider if you are at risk.  If you do not have HIV, but are at risk, it may be recommended that you take a prescription medicine daily to prevent HIV infection. This is called pre-exposure prophylaxis (PrEP). You are considered at risk if:  You are sexually active and do not regularly use condoms or know the HIV status of your partner(s).  You take drugs by injection.  You are sexually  active with a partner who has HIV. Talk with your health care provider about whether you are at high risk of being infected with HIV. If you choose to begin PrEP, you should first be tested for HIV. You should then be tested every 3 months for as long as you are taking PrEP.  PREGNANCY   If you are premenopausal and you may become pregnant, ask your health care provider about preconception counseling.  If you may  become pregnant, take 400 to 800 micrograms (mcg) of folic acid every day.  If you want to prevent pregnancy, talk to your health care provider about birth control (contraception). OSTEOPOROSIS AND MENOPAUSE   Osteoporosis is a disease in which the bones lose minerals and strength with aging. This can result in serious bone fractures. Your risk for osteoporosis can be identified using a bone density scan.  If you are 61 years of age or older, or if you are at risk for osteoporosis and fractures, ask your health care provider if you should be screened.  Ask your health care provider whether you should take a calcium or vitamin D supplement to lower your risk for osteoporosis.  Menopause may have certain physical symptoms and risks.  Hormone replacement therapy may reduce some of these symptoms and risks. Talk to your health care provider about whether hormone replacement therapy is right for you.  HOME CARE INSTRUCTIONS   Schedule regular health, dental, and eye exams.  Stay current with your immunizations.   Do not use any tobacco products including cigarettes, chewing tobacco, or electronic cigarettes.  If you are pregnant, do not drink alcohol.  If you are breastfeeding, limit how much and how often you drink alcohol.  Limit alcohol intake to no more than 1 drink per day for nonpregnant women. One drink equals 12 ounces of beer, 5 ounces of wine, or 1 ounces of hard liquor.  Do not use street drugs.  Do not share needles.  Ask your health care provider for help if  you need support or information about quitting drugs.  Tell your health care provider if you often feel depressed.  Tell your health care provider if you have ever been abused or do not feel safe at home.   This information is not intended to replace advice given to you by your health care provider. Make sure you discuss any questions you have with your health care provider.   Document Released: 08/29/2010 Document Revised: 03/06/2014 Document Reviewed: 01/15/2013 Elsevier Interactive Patient Education [employer].

## 2015-06-17 NOTE — Progress Notes (Signed)
Pre visit review using our clinic review tool, if applicable. No additional management support is needed unless otherwise documented below in the visit note. 

## 2015-06-17 NOTE — Progress Notes (Signed)
Patient ID: Jill Hawkins, female    DOB: [birth year]  Age: 40 y.o. MRN: [id number]  CC: Annual Exam   HPI Jill Hawkins presents for Annual Exam.   1) Annual Physical   Diet- Small portions and low carb diet (gastric bypass)   Exercise- Walking, back pain makes it difficult   Immunizations- UTD  Eye Exam-   Dental Exam-   LMP- IUD  Labs- Done through other offices  Fall- Neg.   Depression- Neg.  Up dates on chronic history-  Revision of Gastric Bypass discussion w/ Dr. Darnell Level  2 yr follow up   Wants to be in a healthier BMI  EKG w/ [facility] Dr. Clayborn Bigness   Getting Echo and Stress test  All for revision surgery prep  Still having back pain   ESI's not helpful, seeing [facility]  Morphine 2.5 mg at night helpful  Still having bladder issues with frequency and urgency   Acute concern-  Sinus- bad smell in nostrils, right ear feels very clogged Going on for 1 month  Clear nose drainage  Claritin and Zyrtec- no difference  Flonase  Denies sick contacts   History Isidora has a past medical history of History of fainting spells of unknown cause; Frequent headaches; GERD (gastroesophageal reflux disease); Hypertension; Kidney stones; Migraine; and UTI (lower urinary tract infection).   She has past surgical history that includes Cholecystectomy (2015); Gastric bypass (04/14/13); Growth plate surgery (075-[facility]); and Carpal tunnel release (2005).   Her family history includes Alcohol abuse in her father; Arthritis in her mother; Cancer in her father and maternal grandfather; Diabetes in her maternal grandmother and mother; Heart disease in her maternal grandmother; Hypertension in her father, maternal grandfather, maternal grandmother, and mother; Mental illness in her brother, maternal grandmother, mother, and paternal grandmother.She reports that she has never smoked. She has never used smokeless tobacco. She reports that she does not drink alcohol or use illicit  drugs.  Outpatient Prescriptions Prior to Visit  Medication Sig Dispense Refill  . ALPRAZolam (XANAX) 1 MG tablet Take 1 mg by mouth as needed for anxiety.    . chlorpheniramine-HYDROcodone (TUSSIONEX PENNKINETIC ER) 10-8 MG/5ML LQCR Take 5 mLs by mouth at bedtime as needed for cough. 115 mL 0  . cyanocobalamin (,VITAMIN B-12,) 1000 MCG/ML injection Inject 1,000 mcg into the muscle.     . cyclobenzaprine (FLEXERIL) 10 MG tablet Take 10 mg by mouth 3 (three) times daily as needed for muscle spasms.    . fluticasone (FLONASE) 50 MCG/ACT nasal spray Place 2 sprays into both nostrils daily. 16 g 6  . folic acid (FOLVITE) 1 MG tablet Take 1 mg by mouth daily.    Marland Kitchen gabapentin (NEURONTIN) 400 MG capsule Take 1 capsule (400 mg total) by mouth at bedtime. 30 capsule 1  . methocarbamol (ROBAXIN) 750 MG tablet Take 750 mg by mouth 2 (two) times daily.    Marland Kitchen nystatin-triamcinolone ointment (MYCOLOG) Apply 1 application topically 2 (two) times daily. 30 g 0  . SUMAtriptan (IMITREX) 100 MG tablet Take 100 mg by mouth as needed for migraine or headache. May repeat in 2 hours if headache persists or recurs.    Marland Kitchen amLODipine (NORVASC) 5 MG tablet Take 5 mg by mouth daily.    Marland Kitchen Dexlansoprazole 30 MG capsule Take 30 mg by mouth daily.    . diclofenac sodium (VOLTAREN) 1 % GEL Apply 4 g topically as needed.    . doxycycline (VIBRA-TABS) 100 MG tablet Take 1  tablet (100 mg total) by mouth 2 (two) times daily. 14 tablet 0  . HYDROcodone-acetaminophen (NORCO) 5-325 MG per tablet Take 2 tablets by mouth every 6 (six) hours as needed for moderate pain. 30 tablet 0  . ondansetron (ZOFRAN-ODT) 4 MG disintegrating tablet Take 1 tablet (4 mg total) by mouth once. 20 tablet 0  . oxyCODONE (ROXICODONE INTENSOL) 20 MG/ML concentrated solution Take 0.5 mLs (10 mg total) by mouth every 8 (eight) hours as needed for severe pain. 5 mL 0  . predniSONE (STERAPRED UNI-PAK 21 TAB) 10 MG (21) TBPK tablet 6 tablets on day 1, 5 tablets on  day 2, 4 tablets on day 3, etc... 21 tablet 0  . pseudoephedrine (SUDAFED) 30 MG tablet Take 30 mg by mouth every 8 (eight) hours as needed for congestion (OTC).    Marland Kitchen sucralfate (CARAFATE) 1 GM/10ML suspension Take 1 g by mouth 4 (four) times daily.     No facility-administered medications prior to visit.    ROS Review of Systems  Constitutional: Negative for fever, chills, diaphoresis, fatigue and unexpected weight change.  HENT: Positive for congestion, ear pain, postnasal drip, rhinorrhea and sinus pressure. Negative for tinnitus and trouble swallowing.   Eyes: Negative for visual disturbance.  Respiratory: Negative for chest tightness, shortness of breath and wheezing.   Cardiovascular: Negative for chest pain, palpitations and leg swelling.  Gastrointestinal: Negative for nausea, vomiting, abdominal pain, diarrhea, constipation and blood in stool.  Endocrine: Negative for polydipsia, polyphagia and polyuria.  Genitourinary: Positive for urgency and frequency. Negative for dysuria, hematuria, vaginal discharge and vaginal pain.  Musculoskeletal: Positive for back pain. Negative for myalgias, arthralgias and gait problem.  Skin: Negative for color change and rash.  Neurological: Negative for dizziness, weakness, numbness and headaches.  Hematological: Does not bruise/bleed easily.  Psychiatric/Behavioral: Negative for suicidal ideas and sleep disturbance. The patient is not nervous/anxious.     Objective:  BP 101/59 mmHg  Pulse 76  Temp(Src) 98.8 F (37.1 C) (Oral)  Ht 5' 7.5" (1.715 m)  Wt 272 lb 6 oz (123.548 kg)  BMI 42.01 kg/m2  SpO2 99%  Physical Exam  Constitutional: She is oriented to person, place, and time. She appears well-developed and well-nourished. No distress.  HENT:  Head: Normocephalic and atraumatic.  Right Ear: External ear normal.  Left Ear: External ear normal.  Mouth/Throat: Oropharynx is clear and moist. No oropharyngeal exudate.  TMs clear  bilaterally Boggy turbinates bilaterally  Eyes: EOM are normal. Pupils are equal, round, and reactive to light. Right eye exhibits no discharge. Left eye exhibits no discharge. No scleral icterus.  Neck: Normal range of motion. Neck supple.  Cardiovascular: Normal rate, regular rhythm, normal heart sounds and intact distal pulses.  Exam reveals no gallop and no friction rub.   No murmur heard. Pulmonary/Chest: Effort normal and breath sounds normal. No respiratory distress. She has no wheezes. She has no rales. She exhibits no tenderness.  Abdominal: Soft. Bowel sounds are normal. She exhibits no distension and no mass. There is no tenderness. There is no rebound and no guarding.  Obese  Genitourinary:  Deferred   Musculoskeletal: Normal range of motion. She exhibits no edema or tenderness.  Lymphadenopathy:    She has no cervical adenopathy.  Neurological: She is alert and oriented to person, place, and time. She displays abnormal reflex. She exhibits normal muscle tone. Coordination normal.  Patella Right 1+ , left 2+   Skin: Skin is warm and dry. No rash noted. She is  not diaphoretic.  Psychiatric: She has a normal mood and affect. Her behavior is normal. Judgment and thought content normal.   Assessment & Plan:   Jill Hawkins was seen today for annual exam.  Diagnoses and all orders for this visit:  Routine general medical examination at a health care facility  Bilateral low back pain without sciatica  Urinary frequency  Acute maxillary sinusitis, recurrence not specified  Other orders -     doxycycline (VIBRA-TABS) 100 MG tablet; Take 1 tablet (100 mg total) by mouth 2 (two) times daily.  I have discontinued Ms. Dacruz amLODipine, Dexlansoprazole, sucralfate, pseudoephedrine, diclofenac sodium, doxycycline, predniSONE, HYDROcodone-acetaminophen, ondansetron, and oxyCODONE. I am also having her start on doxycycline. Additionally, I am having her maintain her folic acid,  ALPRAZolam, SUMAtriptan, cyclobenzaprine, cyanocobalamin, chlorpheniramine-HYDROcodone, fluticasone, methocarbamol, gabapentin, nystatin-triamcinolone ointment, levonorgestrel, and morphine.  Meds ordered this encounter  Medications  . levonorgestrel (LILETTA) 18.6 MCG/DAY IUD IUD    Sig: by Intrauterine route.  Marland Kitchen morphine 10 MG/5ML solution    Sig: Take 2.5 mLs by mouth at bedtime.  Marland Kitchen doxycycline (VIBRA-TABS) 100 MG tablet    Sig: Take 1 tablet (100 mg total) by mouth 2 (two) times daily.    Dispense:  14 tablet    Refill:  0    Order Specific Question:  Supervising Provider    Answer:  Crecencio Mc [[id number]]     Follow-up: No Follow-up on file.

## 2015-06-23 ENCOUNTER — Telehealth: Payer: Self-pay

## 2015-06-23 NOTE — Telephone Encounter (Signed)
Called Dr. Wille Glaser Bruce's office. Spoke with [place]. She fwd message to the clinic.

## 2015-06-23 NOTE — Telephone Encounter (Signed)
-----   Message from Rubbie Battiest, NP sent at 06/23/2015  9:53 AM EDT ----- Will you let Dr. Gayla Medicus office know that Jill Hawkins was seen for a physical the other day and reports we should have a form to fill out for her revision surgery. Have not seen anything I am to fill out from them.

## 2015-06-26 ENCOUNTER — Encounter: Payer: Self-pay | Admitting: Nurse Practitioner

## 2015-06-27 DIAGNOSIS — Z0001 Encounter for general adult medical examination with abnormal findings: Secondary | ICD-10-CM | POA: Insufficient documentation

## 2015-06-27 NOTE — Addendum Note (Signed)
Addended by: Rubbie Battiest on: 06/27/2015 02:52 PM   Modules accepted: Miquel Dunn

## 2015-06-27 NOTE — Assessment & Plan Note (Signed)
Up date on Chronic concern today Followed by Neuro at [facility] ESIs not helpful, morphine through pain clinic

## 2015-06-27 NOTE — Assessment & Plan Note (Signed)
Pt has had symptoms x 1 month Doxycyline sent to pharmacy FU prn worsening/failure to improve.

## 2015-06-27 NOTE — Assessment & Plan Note (Signed)
Up date on chronic concern Still having bladder issues  Sees [facility] and they are helping her with these concerns at this time

## 2015-06-27 NOTE — Assessment & Plan Note (Addendum)
Discussed acute and chronic issues. Reviewed health maintenance measures, PFSHx, and immunizations.   Pt had labs done recently with Dr. Darnell Level- records obtained  Looking for a form to say yes, able to have revision of surgery after today's physical exam. EKG with Dr. Clayborn Bigness- further cardiology work ups HM UTD

## 2015-10-11 ENCOUNTER — Encounter: Payer: Self-pay | Admitting: Nurse Practitioner

## 2015-11-09 ENCOUNTER — Ambulatory Visit
Admission: RE | Admit: 2015-11-09 | Discharge: 2015-11-09 | Disposition: A | Discharge status: Home / Self Care | Payer: Managed Care, Other (non HMO) | Source: Ambulatory Visit | Attending: Specialist | Admitting: Specialist

## 2015-11-18 ENCOUNTER — Telehealth: Payer: Self-pay | Admitting: *Deleted

## 2015-11-18 ENCOUNTER — Ambulatory Visit (INDEPENDENT_AMBULATORY_CARE_PROVIDER_SITE_OTHER): Payer: Managed Care, Other (non HMO) | Admitting: Family Medicine

## 2015-11-18 ENCOUNTER — Other Ambulatory Visit (HOSPITAL_COMMUNITY)
Admission: RE | Admit: 2015-11-18 | Discharge: 2015-11-18 | Disposition: A | Discharge status: Home / Self Care | Payer: Managed Care, Other (non HMO) | Source: Ambulatory Visit | Attending: Family Medicine | Admitting: Family Medicine

## 2015-11-18 ENCOUNTER — Encounter: Payer: Self-pay | Admitting: Family Medicine

## 2015-11-18 ENCOUNTER — Ambulatory Visit
Admission: RE | Admit: 2015-11-18 | Discharge: 2015-11-18 | Disposition: A | Discharge status: Home / Self Care | Payer: Managed Care, Other (non HMO) | Source: Ambulatory Visit | Attending: Family Medicine | Admitting: Family Medicine

## 2015-11-18 ENCOUNTER — Ambulatory Visit: Payer: Managed Care, Other (non HMO) | Admitting: Family Medicine

## 2015-11-18 VITALS — BP 118/66 | HR 73 | Temp 97.9°F | Wt 277.4 lb

## 2015-11-18 DIAGNOSIS — R3129 Other microscopic hematuria: Secondary | ICD-10-CM

## 2015-11-18 DIAGNOSIS — R319 Hematuria, unspecified: Secondary | ICD-10-CM | POA: Insufficient documentation

## 2015-11-18 DIAGNOSIS — R918 Other nonspecific abnormal finding of lung field: Secondary | ICD-10-CM | POA: Diagnosis not present

## 2015-11-18 DIAGNOSIS — R1032 Left lower quadrant pain: Secondary | ICD-10-CM | POA: Diagnosis not present

## 2015-11-18 DIAGNOSIS — M545 Low back pain: Secondary | ICD-10-CM

## 2015-11-18 DIAGNOSIS — R109 Unspecified abdominal pain: Secondary | ICD-10-CM | POA: Diagnosis present

## 2015-11-18 DIAGNOSIS — Z9884 Bariatric surgery status: Secondary | ICD-10-CM | POA: Diagnosis not present

## 2015-11-18 DIAGNOSIS — N898 Other specified noninflammatory disorders of vagina: Secondary | ICD-10-CM | POA: Diagnosis not present

## 2015-11-18 DIAGNOSIS — Z113 Encounter for screening for infections with a predominantly sexual mode of transmission: Secondary | ICD-10-CM | POA: Diagnosis not present

## 2015-11-18 LAB — URINALYSIS, MICROSCOPIC ONLY

## 2015-11-18 LAB — POCT URINALYSIS DIPSTICK
BILIRUBIN UA: NEGATIVE
GLUCOSE UA: NEGATIVE
Ketones, UA: NEGATIVE
Leukocytes, UA: NEGATIVE
Nitrite, UA: NEGATIVE
Protein, UA: NEGATIVE
Urobilinogen, UA: 0.2
pH, UA: 5.5

## 2015-11-18 LAB — POCT URINE PREGNANCY: Preg Test, Ur: NEGATIVE

## 2015-11-18 MED ORDER — NITROFURANTOIN MONOHYD MACRO 100 MG PO CAPS: 100.0000 mg | ORAL_CAPSULE | Freq: Two times a day (BID) | ORAL | 0 refills | Status: DC

## 2015-11-18 NOTE — Patient Instructions (Signed)
Nice to meet you. We're going to obtain a CT scan of your abdomen and pelvis to evaluate for stone or other cause of your symptoms. We'll send your urine for a culture and have it evaluated under microscope. Once the CT scan comes back we will better idea with regards to your treatment and will contact you with the results. If you develop worsening abdominal pain, fevers, vomiting, diarrhea, or any new or changing symptoms please seek medical attention.

## 2015-11-18 NOTE — Assessment & Plan Note (Signed)
Patient with a variety of different symptoms. She reports symptoms are consistent with prior kidney stone and she does have 3+ blood on her dipstick today making the leading diagnostic possibility nephrolithiasis. Though given her tenderness on abdominal exam the worry would be for some other underlying cause. Urine pregnancy test negative. Gonorrhea and chlamydia will be tested and wet prep tested. Urine was sent for culture and microscopy. Given tenderness and concern for kidney stone with somewhat inconsistent picture we will obtain a CT scan abdomen and pelvis without contrast to evaluate further. Once this returns we will determine the next step in management. She is given return precautions.

## 2015-11-18 NOTE — Telephone Encounter (Signed)
I spoke with patient regarding results. Advised there was no obvious cause for her symptoms. Urine microscopy did reveal white blood cells, red blood cells, and few bacteria. Symptoms could be related to UTI. Discussed given that the CT scan was unremarkable for cause we would proceed with treatment for UTI and await urine culture and vaginal swab results. She was given return precautions.

## 2015-11-18 NOTE — Progress Notes (Signed)
Jill Rumps, MD Phone: [phone]  Jill Hawkins is a 40 y.o. female who presents today for same-day visit.  Patient notes onset of left flank pain and left lower quadrant abdominal discomfort on Monday night. Notes it felt like something came loose in this area. Has been constant. Nothing alleviates it. She has a history of kidney stones in the past and states this feels similar to her prior kidney stones. Also has a history of chronic back pain though this feels different. Notes some urinary frequency and urgency. No hematuria, fevers, or dysuria. No other abdominal discomfort. Does note some vaginal discharge though feels this is her normal discharge related to her IUD. Had vaginal ultrasound earlier this year to ensure that the IUD was in the proper place. She is sexually active. No history of STDs. No vomiting or diarrhea.  PMH: nonsmoker.   ROS see history of present illness  Objective  Physical Exam Vitals:   11/18/15 0954  BP: 118/66  Pulse: 73  Temp: 97.9 F (36.6 C)    BP Readings from Last 3 Encounters:  11/18/15 118/66  06/17/15 (!) 101/59  01/14/15 110/72   Wt Readings from Last 3 Encounters:  11/18/15 277 lb 6.4 oz (125.8 kg)  06/17/15 272 lb 6 oz (123.5 kg)  01/14/15 250 lb (113.4 kg)    Physical Exam  Constitutional: No distress.  Cardiovascular: Normal rate, regular rhythm and normal heart sounds.   Pulmonary/Chest: Effort normal and breath sounds normal.  Abdominal: Soft. Bowel sounds are normal. She exhibits no distension and no mass. There is tenderness (left lower quadrant tenderness). There is no rebound and no guarding.  No CVA tenderness  Genitourinary:  Genitourinary Comments: Technically difficult pelvic exam, external labia appear normal, vaginal mucosa appears normal, there is brown dried blood in the vaginal vault, given body habitus I was unable to appreciate the cervical os on speculum exam, no cervical motion tenderness, no adnexal  tenderness  Musculoskeletal: She exhibits no edema.  No midline spine tenderness, no midline spine step-off, no muscular back tenderness  Neurological: She is alert. Gait normal.  Skin: Skin is warm and dry. She is not diaphoretic.     Assessment/Plan: Please see individual problem list.  Left lower quadrant pain Patient with a variety of different symptoms. She reports symptoms are consistent with prior kidney stone and she does have 3+ blood on her dipstick today making the leading diagnostic possibility nephrolithiasis. Though given her tenderness on abdominal exam the worry would be for some other underlying cause. Urine pregnancy test negative. Gonorrhea and chlamydia will be tested and wet prep tested. Urine was sent for culture and microscopy. Given tenderness and concern for kidney stone with somewhat inconsistent picture we will obtain a CT scan abdomen and pelvis without contrast to evaluate further. Once this returns we will determine the next step in management. She is given return precautions.   Orders Placed This Encounter  Procedures  . WET PREP BY MOLECULAR PROBE  . Urine Culture  . CT Abdomen Pelvis Wo Contrast    Standing Status:   Future    Number of Occurrences:   1    Standing Expiration Date:   02/16/2017    Order Specific Question:   Reason for Exam (SYMPTOM  OR DIAGNOSIS REQUIRED)    Answer:   left flank pain and left lower quadrant pain and tenderness with microscopic hematuria on UA, concern for renal stone    Order Specific Question:   Is patient pregnant?  Answer:   No    Order Specific Question:   Preferred imaging location?    Answer:   [facility]    Order Specific Question:   Call Results- Best Contact Number?    AnswerAL:[id number]  . Urine Microscopic Only  . POCT Urinalysis Dipstick  . POCT urine pregnancy   Jill Rumps, MD [place]

## 2015-11-18 NOTE — Telephone Encounter (Signed)
Please fax over pt's last pregnancy test for  Her CT scan, pt is currently at [facility]  Fax [phone]

## 2015-11-18 NOTE — Progress Notes (Signed)
Pre visit review using our clinic review tool, if applicable. No additional management support is needed unless otherwise documented below in the visit note. 

## 2015-11-18 NOTE — Telephone Encounter (Signed)
FYI: CT Scan for abdominal and pelvis is ready for view.

## 2015-11-18 NOTE — Telephone Encounter (Signed)
FYI, thanks.

## 2015-11-18 NOTE — Telephone Encounter (Signed)
Results faxed over, thanks

## 2015-11-19 LAB — CERVICOVAGINAL ANCILLARY ONLY
CHLAMYDIA, DNA PROBE: NEGATIVE
Neisseria Gonorrhea: NEGATIVE

## 2015-11-19 LAB — WET PREP BY MOLECULAR PROBE
Candida species: NEGATIVE
GARDNERELLA VAGINALIS: NEGATIVE
Trichomonas vaginosis: NEGATIVE

## 2015-11-22 ENCOUNTER — Encounter: Payer: Self-pay | Admitting: Family Medicine

## 2015-11-23 ENCOUNTER — Telehealth: Payer: Self-pay | Admitting: *Deleted

## 2015-11-23 NOTE — Telephone Encounter (Signed)
LM for patient to return call.

## 2015-11-23 NOTE — Telephone Encounter (Signed)
Please let the patient know that unfortunately the lab never received her urine. If she is continuing to have symptoms she should come in for re-evaluation and to have her urine recollected. Thanks.

## 2015-11-23 NOTE — Telephone Encounter (Signed)
I called [facility] & they have resulted the wet prep. They also stated that they did not receive a urine culture. We show that the order was released & printed but [facility] Loop said they never received it.

## 2015-11-23 NOTE — Telephone Encounter (Signed)
-----   Message from Leone Haven, MD sent at 11/23/2015 11:11 AM EDT ----- Patient had a urine culture that has not resulted yet from last week. Can you check in to this? Thanks. Randall Hiss.   ----- Message ----- From: Durwin Glaze, CMA Sent: 11/18/2015  10:01 AM To: Leone Haven, MD

## 2015-11-25 NOTE — Telephone Encounter (Signed)
Sent patient Mychart message.

## 2015-11-27 ENCOUNTER — Encounter: Payer: Self-pay | Admitting: Family Medicine

## 2015-12-16 ENCOUNTER — Encounter: Payer: Self-pay | Admitting: Family Medicine

## 2015-12-20 ENCOUNTER — Ambulatory Visit (INDEPENDENT_AMBULATORY_CARE_PROVIDER_SITE_OTHER): Payer: Managed Care, Other (non HMO) | Admitting: Family Medicine

## 2015-12-20 ENCOUNTER — Encounter: Payer: Self-pay | Admitting: Family Medicine

## 2015-12-20 VITALS — BP 108/74 | HR 62 | Temp 98.2°F | Wt 287.4 lb

## 2015-12-20 DIAGNOSIS — Z01818 Encounter for other preprocedural examination: Secondary | ICD-10-CM

## 2015-12-20 DIAGNOSIS — M545 Low back pain: Secondary | ICD-10-CM | POA: Diagnosis not present

## 2015-12-20 DIAGNOSIS — G8929 Other chronic pain: Secondary | ICD-10-CM | POA: Diagnosis not present

## 2015-12-20 DIAGNOSIS — J069 Acute upper respiratory infection, unspecified: Secondary | ICD-10-CM | POA: Diagnosis not present

## 2015-12-20 DIAGNOSIS — B9789 Other viral agents as the cause of diseases classified elsewhere: Secondary | ICD-10-CM

## 2015-12-20 LAB — CBC
HCT: 38.1 % (ref 36.0–46.0)
Hemoglobin: 12.8 g/dL (ref 12.0–15.0)
MCHC: 33.6 g/dL (ref 30.0–36.0)
MCV: 88.8 fl (ref 78.0–100.0)
PLATELETS: 195 10*3/uL (ref 150.0–400.0)
RBC: 4.28 Mil/uL (ref 3.87–5.11)
RDW: 14.2 % (ref 11.5–15.5)
WBC: 4.9 10*3/uL (ref 4.0–10.5)

## 2015-12-20 LAB — COMPREHENSIVE METABOLIC PANEL
ALT: 24 U/L (ref 0–35)
AST: 22 U/L (ref 0–37)
Albumin: 4.2 g/dL (ref 3.5–5.2)
Alkaline Phosphatase: 52 U/L (ref 39–117)
BILIRUBIN TOTAL: 0.6 mg/dL (ref 0.2–1.2)
BUN: 11 mg/dL (ref 6–23)
CALCIUM: 9.6 mg/dL (ref 8.4–10.5)
CO2: 30 meq/L (ref 19–32)
CREATININE: 0.51 mg/dL (ref 0.40–1.20)
Chloride: 106 mEq/L (ref 96–112)
GFR: 143 mL/min (ref >60.00)
Glucose, Bld: 88 mg/dL (ref 70–99)
Potassium: 4.7 mEq/L (ref 3.5–5.1)
SODIUM: 140 meq/L (ref 135–145)
Total Protein: 6.9 g/dL (ref 6.0–8.3)

## 2015-12-20 MED ORDER — DOXYCYCLINE HYCLATE 100 MG PO TABS: 100.0000 mg | ORAL_TABLET | Freq: Two times a day (BID) | ORAL | 0 refills | Status: DC

## 2015-12-20 NOTE — Assessment & Plan Note (Signed)
Patient's symptoms and duration of symptoms argue for viral upper respiratory infection. No focal findings indicate bacterial illness. I discussed Claritin and Flonase. She was provided with a doxycycline prescription to fill on Friday if not improving given her history of sinusitis. If she develops any new symptoms she will contact us prior to filling this. She is given return precautions.

## 2015-12-20 NOTE — Progress Notes (Signed)
[name], MD Phone: [phone]  Jill Hawkins is a 40 y.o. female who presents today for follow-up.  Chronic pain: Patient notes she is followed by [facility]. 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 Also the arthritic changes. She's received spinal injections and epidurals. Also with facet injections. She is on morphine. 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 Also on gabapentin and Lyrica. She wants to know if I can take over her morphine. She notes chronic right lateral thigh numbness and tingling that is unchanged. She notes no weakness, saddle anesthesia, loss of bowel or bladder function, or fevers.  Upper respiratory infection: Patient notes scratchy throat starting on Saturday. Notes some sinus pressure. No fevers. Her ears have intermittently felt stopped up. No cough. No trouble breathing. She has not tried any medications for this.  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 This is likely going to be a duodenal switch per her report. She had a stress test and echo both of which were unremarkable earlier this year. She denies chest pain and breathlessness with climbing 2 flights of stairs. She notes no kidney disease. No family history of anesthesia issues. No history of MI, irregular heartbeat, stroke, seizures, thyroid dysfunction, angina, liver disease, heart failure, asthma, diabetes, or bronchitis. She notes no prior issues with anesthesia. She does note occasional stiffness in her neck though this is not persistent.   PMH: nonsmoker.   ROS see history of present illness  Objective  Physical Exam Vitals:   12/20/15 1330  BP: 108/74  Pulse: 62  Temp: 98.2 F (36.8 C)    BP Readings from Last 3 Encounters:  12/20/15 108/74  11/18/15 118/66  06/17/15 (!) 101/59   Wt Readings from Last 3 Encounters:  12/20/15 287 lb 6 oz (130.4 kg)  11/18/15 277 lb 6.4 oz (125.8 kg)  06/17/15 272 lb 6 oz (123.5 kg)     Physical Exam  Constitutional: No distress.  HENT:  Head: Normocephalic and atraumatic.  Mouth/Throat: Oropharynx is clear and moist. No oropharyngeal exudate.  Normal TMs bilaterally  Eyes: Conjunctivae are normal. Pupils are equal, round, and reactive to light.  Cardiovascular: Normal rate, regular rhythm and normal heart sounds.   Pulmonary/Chest: Effort normal and breath sounds normal.  Musculoskeletal: She exhibits no edema.  No midline spine tenderness, no midline spine step-off, mild diffuse muscular lumbar spine tenderness  Neurological: She is alert. Gait normal.  5 out of 5 strength bilateral quads, hamstrings, plantar flexion, and dorsiflexion, she notes some mild decreased sensation to light touch in her right lateral thigh that is at baseline, otherwise sensation light touch intact bilateral lower extremities  Skin: Skin is warm and dry. She is not diaphoretic.     Assessment/Plan: Please see individual problem list.  Back pain This issue is stable. She is neurologically at baseline in her lower extremities. I discussed with her that I do not manage chronic narcotics. I offered her a referral to a local pain clinic if she would like. She'll continue to monitor.  Preop examination Patient is low risk for surgical complications by ASQIP. Recently had normal stress test and normal echo. I have advised on her paperwork that she is low risk from my perspective. This will be faxed to her surgeon's office.  Viral upper respiratory infection Patient's symptoms and duration of symptoms argue for viral upper respiratory infection. No focal findings indicate bacterial illness. I discussed Claritin and Flonase.  She was provided with a doxycycline prescription to fill on Friday if not improving given her history of sinusitis. If she develops any new symptoms she will contact us prior to filling this. She is given return precautions.   Orders Placed This Encounter  Procedures  .  CBC  . Comp Met (CMET)    [name], MD [place]

## 2015-12-20 NOTE — Progress Notes (Signed)
Pre visit review using our clinic review tool, if applicable. No additional management support is needed unless otherwise documented below in the visit note. 

## 2015-12-20 NOTE — Assessment & Plan Note (Signed)
Patient is low risk for surgical complications by ASQIP. Recently had normal stress test and normal echo. I have advised on her paperwork that she is low risk from my perspective. This will be faxed to her surgeon's office.

## 2015-12-20 NOTE — Patient Instructions (Signed)
Nice to see you. We will complete your paperwork. If you have not heard anything by Thursday please call us. Your upper respiratory symptoms are likely related to viral illness. You can take over-the-counter Claritin and Flonase. You can also take Tylenol. I provided you with an antibiotic prescription to fill on Friday if you're not improved. If you develop any new symptoms You should let us know prior to filling the antibiotic. If you develop fevers, cough productive of blood, or shortness of breath please seek medical attention.

## 2015-12-20 NOTE — Assessment & Plan Note (Signed)
This issue is stable. She is neurologically at baseline in her lower extremities. I discussed with her that I do not manage chronic narcotics. I offered her a referral to a local pain clinic if she would like. She'll continue to monitor.

## 2015-12-24 ENCOUNTER — Telehealth: Payer: Self-pay | Admitting: Family Medicine

## 2015-12-24 ENCOUNTER — Encounter: Payer: Self-pay | Admitting: Family Medicine

## 2015-12-24 NOTE — Telephone Encounter (Signed)
Please advise 

## 2015-12-24 NOTE — Telephone Encounter (Signed)
Pt called to check the status of the [employer] disability accomodation paperwork.   Call pt @ [phone] if no answer just leave message when it will be ready. Thank you!

## 2015-12-24 NOTE — Telephone Encounter (Signed)
My chart message sent to patient to let her know the latter was completed and left at the front for her to pick up.

## 2016-02-17 ENCOUNTER — Ambulatory Visit: Payer: Managed Care, Other (non HMO) | Admitting: Family Medicine

## 2016-02-22 IMAGING — MR MR CERVICAL SPINE W/O CM
5 series · 43 of 48 positions shown · non-contrast
Comparison: None.

CLINICAL DATA: Right shoulder pain. Numbness and tingling.
Progressive symptoms.

EXAM:
MRI CERVICAL SPINE WITHOUT CONTRAST
TECHNIQUE: Multiplanar, multisequence MR imaging of the cervical spine was
performed. No intravenous contrast was administered.

[Series 3: T2 · sagittal · 3.0mm · 0.56mm/px · 8 of 15 slices shown (1 of 2)]
[im 1/15]
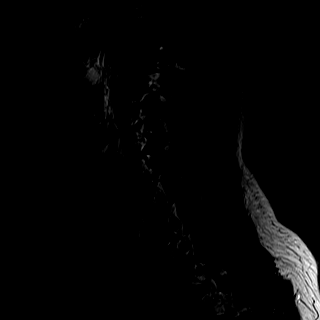
[im 3/15]
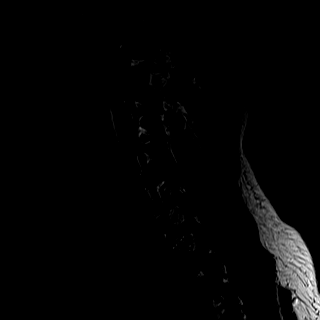
[im 5/15]
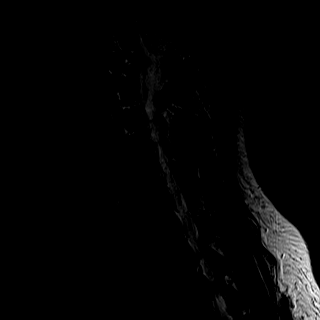
[im 7/15]
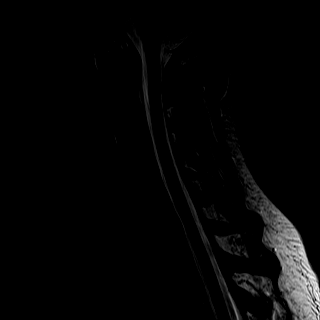
[im 9/15]
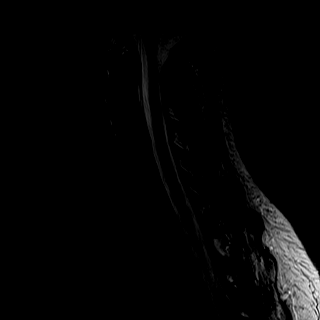
[im 11/15]
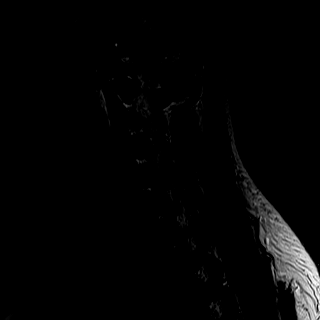
[im 13/15]
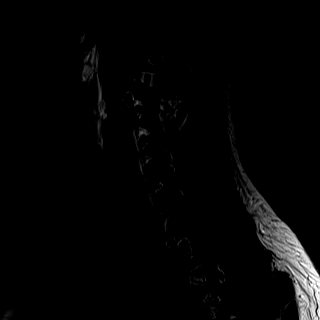
[im 15/15]
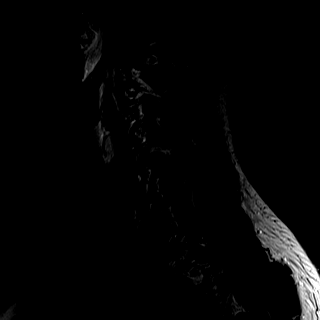

[Series 5: T1 · sagittal · 3.0mm · 0.70mm/px · 7 of 15 slices shown]
[im 1/15]
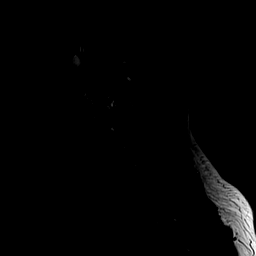
[im 3/15]
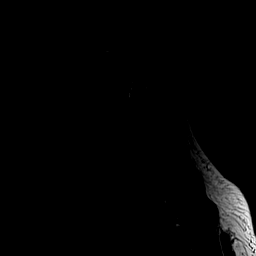
[im 5/15]
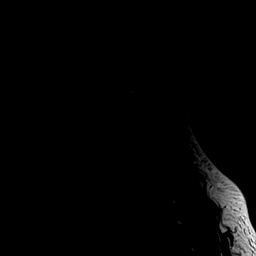
[im 8/15]
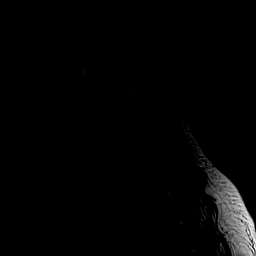
[im 10/15]
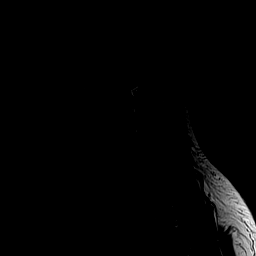
[im 12/15]
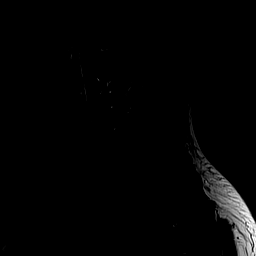
[im 15/15]
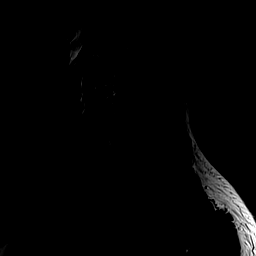

[Series 10: mpgr ax · axial · 3.0mm · 0.35mm/px · z∈[-25,+72]mm · 8 of 27 slices shown]
[im 1/27]
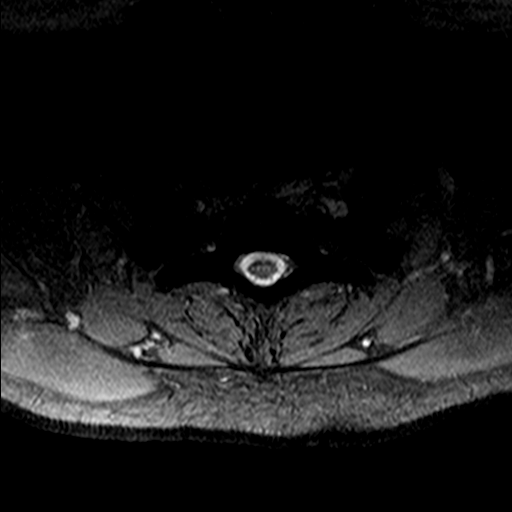
[im 5/27]
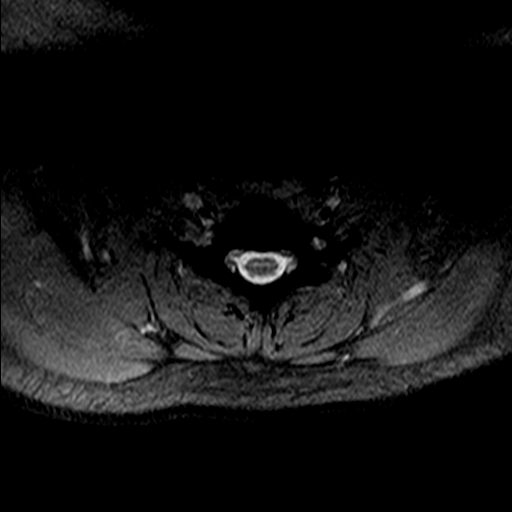
[im 9/27]
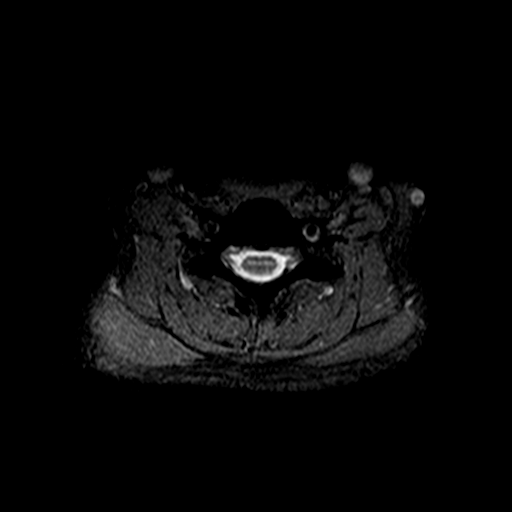
[im 11/27]
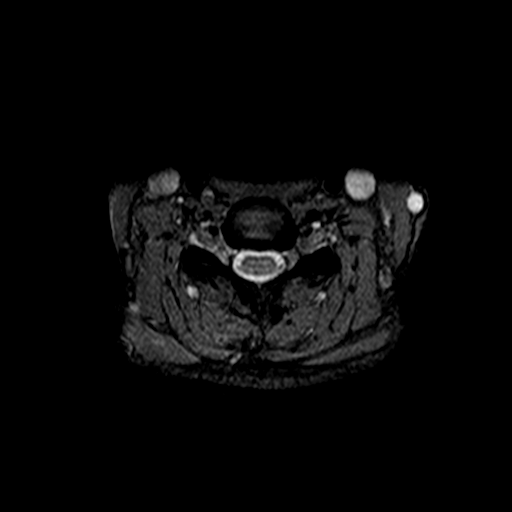
[im 16/27]
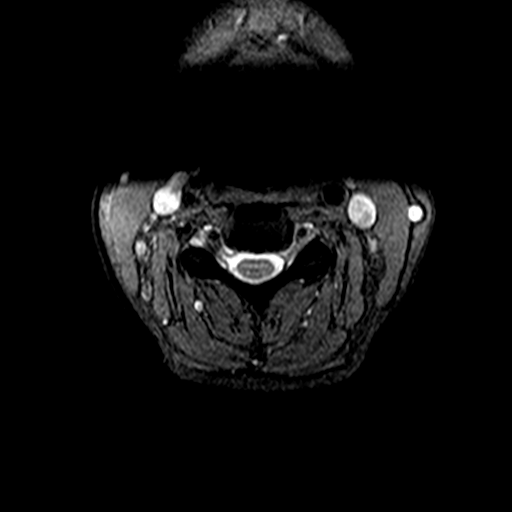
[im 18/27]
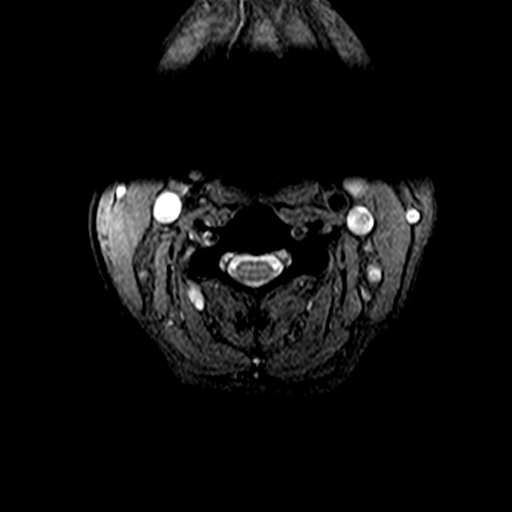
[im 22/27]
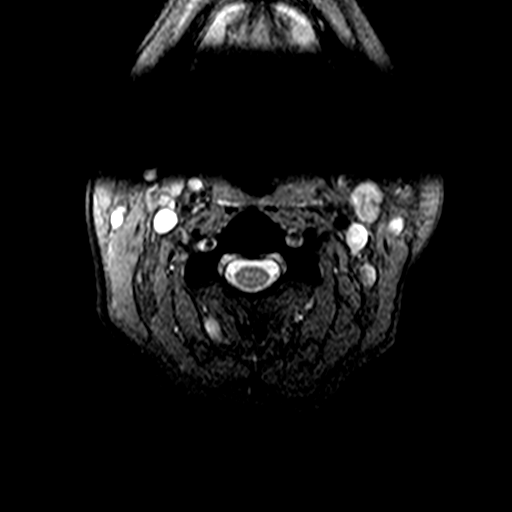
[im 27/27]
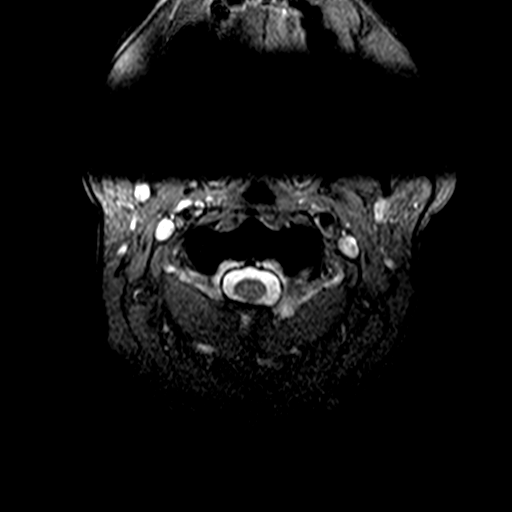

[Series 12: T2 · axial · 3.0mm · 0.70mm/px · z∈[-25,+72]mm · 13 of 27 slices shown (2 of 2)]
[im 1/27]
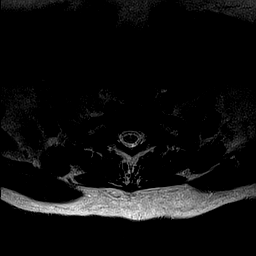
[im 3/27]
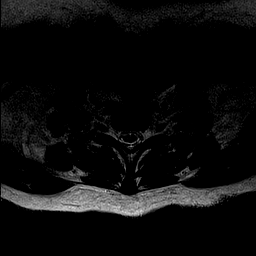
[im 5/27]
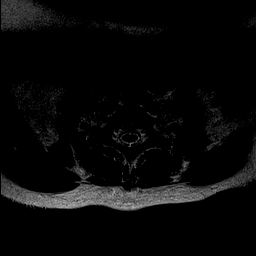
[im 7/27]
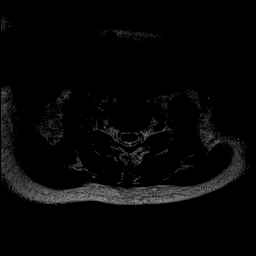
[im 9/27]
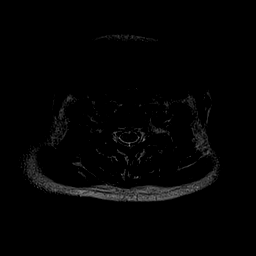
[im 11/27]
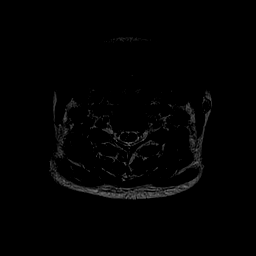
[im 14/27]
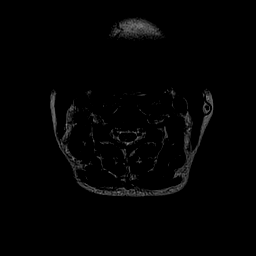
[im 16/27]
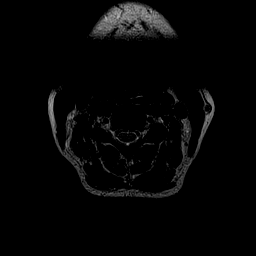
[im 18/27]
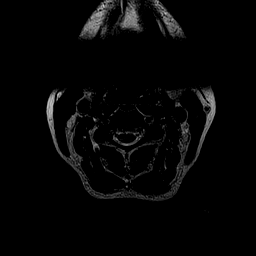
[im 20/27]
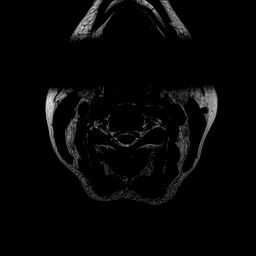
[im 22/27]
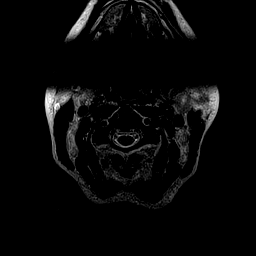
[im 24/27]
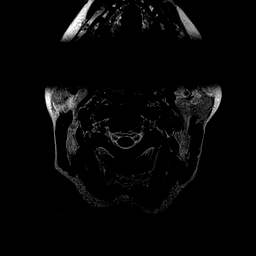
[im 27/27]
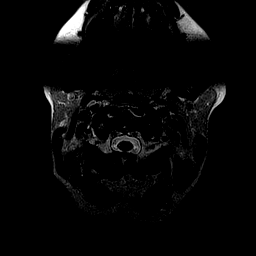

[Series 14: STIR · sagittal · 3.0mm · 0.70mm/px · 7 of 15 slices shown]
[im 1/15]
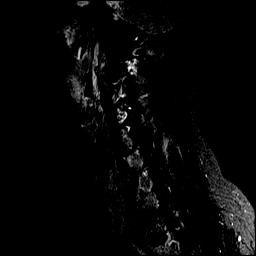
[im 3/15]
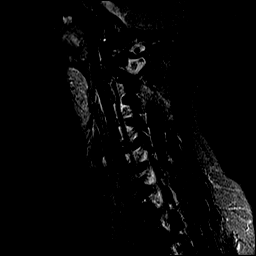
[im 5/15]
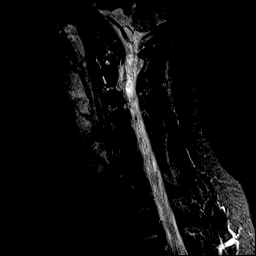
[im 8/15]
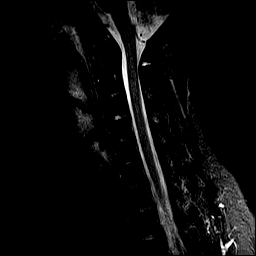
[im 10/15]
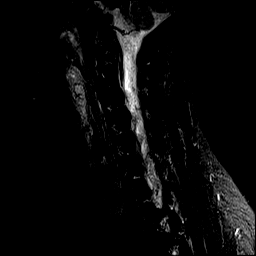
[im 12/15]
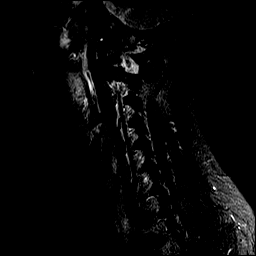
[im 15/15]
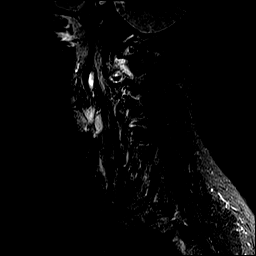

[43 of 48 positions shown; findings below may reference images not displayed]

FINDINGS: Normal signal is present in the cervical and upper thoracic spinal
cord to the lowest imaged level, T1-2. Marrow signal, vertebral body
heights, and alignment are normal. The craniocervical junction is
within normal limits. The visualized intracranial contents are
normal. Flow is present in the vertebral arteries.

No significant disc protrusion or stenosis is present within the
cervical spine. The foramina are patent bilaterally.
IMPRESSION: 1. No significant focal stenosis or disc disease to explain the
patient's symptoms.

## 2016-03-18 ENCOUNTER — Encounter: Payer: Self-pay | Admitting: Family Medicine

## 2016-03-19 ENCOUNTER — Telehealth: Payer: Self-pay | Admitting: Family Medicine

## 2016-03-19 NOTE — Telephone Encounter (Signed)
Attempted to contact patient by her mobile number and there was no answer and no room on her voicemail for a message. Contacted her home number and her husband answered. Patient was not home. I asked him to let her know that her doctors office called and I would send her a mychart message. He asked if something was wrong and I advised that I was just responding to a message she sent. It appears he is on her DPR though no medical information was shared with him. I will respond to her mychart message.

## 2016-03-20 ENCOUNTER — Encounter: Payer: Self-pay | Admitting: Family Medicine

## 2016-03-20 ENCOUNTER — Ambulatory Visit (INDEPENDENT_AMBULATORY_CARE_PROVIDER_SITE_OTHER): Payer: Managed Care, Other (non HMO) | Admitting: Family Medicine

## 2016-03-20 DIAGNOSIS — J988 Other specified respiratory disorders: Secondary | ICD-10-CM

## 2016-03-20 MED ORDER — BENZONATATE 100 MG PO CAPS: 100.0000 mg | ORAL_CAPSULE | Freq: Three times a day (TID) | ORAL | 0 refills | Status: DC | PRN

## 2016-03-20 NOTE — Assessment & Plan Note (Signed)
New acute problem. Currently on antibiotic.  Advised to complete the course. Voice rest. Tessalon for cough.

## 2016-03-20 NOTE — Patient Instructions (Signed)
Take the medication as prescribed.  Follow up as needed.  Voice rest  Take care  Dr. Lacinda Axon

## 2016-03-20 NOTE — Progress Notes (Signed)
Pre visit review using our clinic review tool, if applicable. No additional management support is needed unless otherwise documented below in the visit note. 

## 2016-03-20 NOTE — Progress Notes (Signed)
Subjective:  Patient ID: Jill Hawkins, female    DOB: [birth year]  Age: 41 y.o. MRN: [id number]  CC: Cough, hoarseness  HPI:  41 year old female presents with the above complaints.  Patient states she's been sick since this past Thursday. Symptoms actually began with nausea and vomiting. This quickly resolved. She subsequently developed hoarseness and then chest tightness and productive cough. She states that she has a history of bronchitis. We were not available over the weekend so she called M.D. live. She was given doxycycline and albuterol for suspected acute bronchitis. She states that since that time she's had little to no improvement. Her voice is actually worse. Cough and hoarseness of her primary complaints today. Cough is mildly productive. No associated shortness of breath. No fevers or chills. No known exacerbating or relieving factors. No other associated symptoms. No other complaints this time.  Social Hx   Social History   Social History  . Marital status: Married    Spouse name: N/A  . Number of children: N/A  . Years of education: N/A   Social History Main Topics  . Smoking status: Never Smoker  . Smokeless tobacco: Never Used  . Alcohol use No  . Drug use: No  . Sexual activity: Yes    Partners: Male     Comment: Husband   Other Topics Concern  . 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  HENT: Positive for voice change.   Respiratory: Positive for cough and chest tightness.    Objective:  BP 129/81   Pulse 70   Temp 98.2 F (36.8 C) (Oral)   Resp 14   Wt 282 lb (127.9 kg)   SpO2 98%   BMI 43.52 kg/m   BP/Weight 03/20/2016 12/20/2015 123456  Systolic BP [id number] [id number]  Diastolic BP 81 74 66  Wt. (Lbs) 282 287.38 277.4  BMI 43.52 44.34  42.81   Physical Exam  Constitutional: She is oriented to person, place, and time. She appears well-developed. No distress.  HENT:  Mouth/Throat: Oropharynx is clear and moist.  Eyes: Conjunctivae are normal.  Neck: Neck supple.  Cardiovascular: Normal rate and regular rhythm.   Pulmonary/Chest: Effort normal and breath sounds normal.  Neurological: She is alert and oriented to person, place, and time.  Psychiatric: She has a normal mood and affect.  Vitals reviewed.  Lab Results  Component Value Date   WBC 4.9 12/20/2015   HGB 12.8 12/20/2015   HCT 38.1 12/20/2015   PLT 195.0 12/20/2015   GLUCOSE 88 12/20/2015   CHOL 144 04/22/2014   TRIG 46.0 04/22/2014   HDL 55.50 04/22/2014   LDLCALC 79 04/22/2014   ALT 24 12/20/2015   AST 22 12/20/2015   NA 140 12/20/2015   K 4.7 12/20/2015   CL 106 12/20/2015   CREATININE 0.51 12/20/2015   BUN 11 12/20/2015   CO2 30 12/20/2015   TSH 0.68 04/22/2014   INR 0.9 10/01/2012   HGBA1C 4.9 04/22/2014    Assessment & Plan:   Problem List Items Addressed This Visit    Respiratory infection    New acute problem. Currently on antibiotic.  Advised to complete the course. Voice rest. Tessalon for cough.  Meds ordered this encounter  Medications  . benzonatate (TESSALON) 100 MG capsule    Sig: Take 1 capsule (100 mg total) by mouth 3 (three) times daily as needed.    Dispense:  30 capsule    Refill:  0    Follow-up: PRN  [place]

## 2016-03-21 ENCOUNTER — Ambulatory Visit: Payer: Managed Care, Other (non HMO) | Admitting: Family Medicine

## 2016-04-18 DIAGNOSIS — R110 Nausea: Secondary | ICD-10-CM | POA: Insufficient documentation

## 2016-05-05 ENCOUNTER — Ambulatory Visit: Payer: Managed Care, Other (non HMO) | Admitting: Family Medicine

## 2016-05-16 DIAGNOSIS — E860 Dehydration: Secondary | ICD-10-CM | POA: Insufficient documentation

## 2016-06-05 ENCOUNTER — Other Ambulatory Visit (HOSPITAL_COMMUNITY)
Admit: 2016-06-05 | Discharge: 2016-06-05 | Disposition: A | Discharge status: Home / Self Care | Payer: Managed Care, Other (non HMO) | Source: Ambulatory Visit | Attending: Specialist | Admitting: Specialist

## 2016-06-05 LAB — COMPREHENSIVE METABOLIC PANEL

## 2016-06-05 LAB — MAGNESIUM

## 2016-06-05 LAB — CBC WITH DIFFERENTIAL/PLATELET

## 2016-06-05 LAB — PHOSPHORUS

## 2016-06-06 ENCOUNTER — Ambulatory Visit: Payer: Managed Care, Other (non HMO) | Admitting: Family Medicine

## 2016-06-20 ENCOUNTER — Ambulatory Visit (INDEPENDENT_AMBULATORY_CARE_PROVIDER_SITE_OTHER): Payer: Managed Care, Other (non HMO)

## 2016-06-20 ENCOUNTER — Encounter (INDEPENDENT_AMBULATORY_CARE_PROVIDER_SITE_OTHER): Payer: Self-pay

## 2016-06-20 ENCOUNTER — Ambulatory Visit (INDEPENDENT_AMBULATORY_CARE_PROVIDER_SITE_OTHER): Payer: Managed Care, Other (non HMO) | Admitting: Family Medicine

## 2016-06-20 ENCOUNTER — Encounter: Payer: Self-pay | Admitting: Family Medicine

## 2016-06-20 VITALS — BP 116/80 | HR 58 | Temp 98.8°F | Wt 267.4 lb

## 2016-06-20 DIAGNOSIS — F32A Depression, unspecified: Secondary | ICD-10-CM | POA: Insufficient documentation

## 2016-06-20 DIAGNOSIS — F3289 Other specified depressive episodes: Secondary | ICD-10-CM | POA: Diagnosis not present

## 2016-06-20 DIAGNOSIS — R001 Bradycardia, unspecified: Secondary | ICD-10-CM | POA: Insufficient documentation

## 2016-06-20 DIAGNOSIS — M799 Soft tissue disorder, unspecified: Secondary | ICD-10-CM | POA: Diagnosis not present

## 2016-06-20 DIAGNOSIS — G8929 Other chronic pain: Secondary | ICD-10-CM

## 2016-06-20 DIAGNOSIS — M25511 Pain in right shoulder: Secondary | ICD-10-CM | POA: Insufficient documentation

## 2016-06-20 DIAGNOSIS — F339 Major depressive disorder, recurrent, unspecified: Secondary | ICD-10-CM | POA: Insufficient documentation

## 2016-06-20 DIAGNOSIS — Z9884 Bariatric surgery status: Secondary | ICD-10-CM | POA: Insufficient documentation

## 2016-06-20 DIAGNOSIS — K649 Unspecified hemorrhoids: Secondary | ICD-10-CM

## 2016-06-20 DIAGNOSIS — J309 Allergic rhinitis, unspecified: Secondary | ICD-10-CM | POA: Diagnosis not present

## 2016-06-20 DIAGNOSIS — F329 Major depressive disorder, single episode, unspecified: Secondary | ICD-10-CM | POA: Insufficient documentation

## 2016-06-20 DIAGNOSIS — M7989 Other specified soft tissue disorders: Secondary | ICD-10-CM | POA: Insufficient documentation

## 2016-06-20 DIAGNOSIS — B372 Candidiasis of skin and nail: Secondary | ICD-10-CM | POA: Diagnosis not present

## 2016-06-20 MED ORDER — HYDROCORTISONE 2.5 % RE CREA: 1.0000 "application " | TOPICAL_CREAM | Freq: Two times a day (BID) | RECTAL | 0 refills | Status: DC

## 2016-06-20 MED ORDER — MONTELUKAST SODIUM 10 MG PO TABS: 10.0000 mg | ORAL_TABLET | Freq: Every day | ORAL | 3 refills | Status: DC

## 2016-06-20 MED ORDER — NYSTATIN-TRIAMCINOLONE 100000-0.1 UNIT/GM-% EX OINT: 1.0000 "application " | TOPICAL_OINTMENT | Freq: Two times a day (BID) | CUTANEOUS | 0 refills | Status: DC

## 2016-06-20 NOTE — Assessment & Plan Note (Signed)
Chronic issue. Not improving. Discussed that I did not think an MRI was necessarily the first step in evaluating this. We'll start with an x-ray. She reports she could not be pregnant. Denied need for urine pregnancy test. Once x-ray returns we'll determine to refer her to.

## 2016-06-20 NOTE — Assessment & Plan Note (Signed)
Following the general surgery. Currently has a PICC line in place for TPN. She will continue to follow with surgeon and pharmacy regarding her TPN and continue to follow with home health regarding her PICC line. Discussed infection return precautions.

## 2016-06-20 NOTE — Progress Notes (Signed)
[name], MD Phone: [phone]  Jill Hawkins is a 41 y.o. female who presents today for follow-up.  Patient underwent revision of her gastric bypass in December. She had significant issues with nausea and diarrhea following this. She's been on TPN for a number of weeks now. Initially had issues with the first PICC line and this was placed. Current PICC line has had no issues though she does note the home health nurse noted it was slightly red around the site last week. Has not progressed. No redness noted currently. Notes her stomach is calming down. Nausea is better. Diarrhea has improved significantly. She follows up with the surgeon next month.  She reports issues with hemorrhoids that have worsened since she had all the diarrhea. She uses Preparation H with little benefit. Notes pain and itching. Rare bleeding. Notes occasional sharp pain with bowel movements in the past though none recently.  She notes she feels overwhelmed. She has a lot on her plate. Minimally depressed at times. No SI. Doesn't have anybody to talk to about it. She is dealing with other family members health issues.  Patient was concerned as the home health nurse stated there may be an issue with her heart rate being in the upper 50s. She has no palpitations. She has no exertional fatigue. She has no symptoms from this.  She notes chronic allergic rhinitis issues. Feels congested and her nose will run. She notes postnasal drip and sneezing as well. Takes Zyrtec and Flonase.  Chronic right shoulder pain: Continues to have issues with this. Follows with pain management. States they recommended doing an MRI of her right shoulder. Notes it hurts in the shoulder and the shoulder blade and radiates down to the elbow at times. She had her neck evaluated previously though this was evidently unremarkable. Notes her shoulder will just hurt her at times.  Patient also notes there is beginning not in the subcutaneous  tissue just medial to her left mid tibia for the last 7-8 years. Notes this will occasionally swell and hurt and then go down. She's never had this evaluated. No calf swelling or pain.  PMH: nonsmoker.   ROS see history of present illness  Objective  Physical Exam Vitals:   06/20/16 1605  BP: 116/80  Pulse: (!) 58  Temp: 98.8 F (37.1 C)    BP Readings from Last 3 Encounters:  06/20/16 116/80  03/20/16 129/81  12/20/15 108/74   Wt Readings from Last 3 Encounters:  06/20/16 267 lb 6.4 oz (121.3 kg)  03/20/16 282 lb (127.9 kg)  12/20/15 287 lb 6 oz (130.4 kg)    Physical Exam  Constitutional: No distress.  HENT:  Head: Normocephalic and atraumatic.  Mouth/Throat: Oropharynx is clear and moist. No oropharyngeal exudate.  Normal TMs  Eyes: Conjunctivae are normal. Pupils are equal, round, and reactive to light.  Cardiovascular: Normal rate, regular rhythm and normal heart sounds.   Pulmonary/Chest: Effort normal and breath sounds normal.  Genitourinary:  Genitourinary Comments: Single non-irritated hemorrhoid noted, rectal exam attempted though patient had discomfort, no fissures were noted  Musculoskeletal: She exhibits no edema.  Full range of motion right shoulder with no tenderness on exam, no discomfort on range of motion though there is some popping felt within the shoulder, left shoulder with no tenderness, has full range of motion  Neurological: She is alert. Gait normal.  Skin: Skin is warm and dry. She is not diaphoretic.  PICC site noted in left arm, dressing in place, small  pad around the insertion site of the PICC with no surrounding erythema, there is a subcutaneous nodule noted just medial to the left mid tibia that is nontender and is freely mobile     Assessment/Plan: Please see individual problem list.  Status post gastric bypass for obesity Following the general surgery. Currently has a PICC line in place for TPN. She will continue to follow with  surgeon and pharmacy regarding her TPN and continue to follow with home health regarding her PICC line. Discussed infection return precautions.  Allergic rhinitis Add Singulair. Continue Flonase and second-generation antihistamine.  Bradycardia Minimal in vitals. Sounds regular on exam. Asymptomatic. Discussed this is nothing to worry about. She'll monitor for any symptoms.  Right shoulder pain Chronic issue. Not improving. Discussed that I did not think an MRI was necessarily the first step in evaluating this. We'll start with an x-ray. She reports she could not be pregnant. Denied need for urine pregnancy test. Once x-ray returns we'll determine to refer her to.  Candidal intertrigo No issues currently. Reports she has responded well to Mycolog cream previously. Refill given.  Hemorrhoids Noted on exam. I also question whether or not she could have a fissure given discomfort on rectal exam. Rectal exam was discontinued as soon as she had any pain. We will treat with Anusol. We will refer her to GI for evaluation.  Depression Minimal depressed mood. She has a lot going on. Discussed having her see a therapist though she wanted to defer this. She'll continue to monitor.  Soft tissue mass Ultrasound ordered to evaluate the area of concern.   Orders Placed This Encounter  Procedures  . DG Shoulder Right    Standing Status:   Future    Number of Occurrences:   1    Standing Expiration Date:   08/20/2017    Order Specific Question:   Reason for Exam (SYMPTOM  OR DIAGNOSIS REQUIRED)    Answer:   chronic right shoulder pain, popping noted on exam    Order Specific Question:   Is patient pregnant?    Answer:   No    Order Specific Question:   Preferred imaging location?    Answer:   [facility] Specific Question:   Radiology Contrast Protocol - do NOT remove file path    Answer:   \\[email or web address]  . [place] Misc Soft Tissue      Standing Status:   Future    Standing Expiration Date:   08/20/2017    Order Specific Question:   Reason for Exam (SYMPTOM  OR DIAGNOSIS REQUIRED)    Answer:   soft tissue nodule in anterior left leg for past 7-8 years, occasionally swells, occasionally painful    Order Specific Question:   Preferred imaging location?    Answer:   [facility]  . Ambulatory referral to Gastroenterology    Referral Priority:   Routine    Referral Type:   Consultation    Referral Reason:   Specialty Services Required    Number of Visits Requested:   1     [name], MD [place]

## 2016-06-20 NOTE — Patient Instructions (Signed)
Nice to see you. We'll treat your hemorrhoids with Anusol. I referred you to GI for evaluation. We'll call you with the results of your right shoulder x-ray. We'll get you set up for an ultrasound of the area in your left anterior leg. I sent in Singulair for your allergies.

## 2016-06-20 NOTE — Assessment & Plan Note (Signed)
Minimal in vitals. Sounds regular on exam. Asymptomatic. Discussed this is nothing to worry about. She'll monitor for any symptoms.

## 2016-06-20 NOTE — Assessment & Plan Note (Signed)
No issues currently. Reports she has responded well to Mycolog cream previously. Refill given.

## 2016-06-20 NOTE — Assessment & Plan Note (Signed)
Minimal depressed mood. She has a lot going on. Discussed having her see a therapist though she wanted to defer this. She'll continue to monitor.

## 2016-06-20 NOTE — Progress Notes (Signed)
Pre visit review using our clinic review tool, if applicable. No additional management support is needed unless otherwise documented below in the visit note. 

## 2016-06-20 NOTE — Assessment & Plan Note (Signed)
Add Singulair. Continue Flonase and second-generation antihistamine.

## 2016-06-20 NOTE — Assessment & Plan Note (Signed)
Noted on exam. I also question whether or not she could have a fissure given discomfort on rectal exam. Rectal exam was discontinued as soon as she had any pain. We will treat with Anusol. We will refer her to GI for evaluation.

## 2016-06-20 NOTE — Assessment & Plan Note (Signed)
Ultrasound ordered to evaluate the area of concern.

## 2016-06-26 ENCOUNTER — Ambulatory Visit
Admission: RE | Admit: 2016-06-26 | Discharge: 2016-06-26 | Disposition: A | Discharge status: Home / Self Care | Payer: Managed Care, Other (non HMO) | Source: Ambulatory Visit | Attending: Family Medicine | Admitting: Family Medicine

## 2016-06-26 DIAGNOSIS — M7989 Other specified soft tissue disorders: Secondary | ICD-10-CM

## 2016-06-26 DIAGNOSIS — M799 Soft tissue disorder, unspecified: Secondary | ICD-10-CM | POA: Diagnosis not present

## 2016-06-27 ENCOUNTER — Telehealth: Payer: Self-pay

## 2016-06-27 DIAGNOSIS — M7989 Other specified soft tissue disorders: Secondary | ICD-10-CM

## 2016-06-27 NOTE — Telephone Encounter (Signed)
-----   Message from Leone Haven, MD sent at 06/26/2016  4:54 PM EDT ----- Please let the patient know that the ultrasound revealed a solid lesion in the area of concern. The radiologist stated it may represent a benign process such as a hematoma which is a collection of blood though they couldn't rule out other causes. Given that the ultrasound is somewhat indeterminate we should obtain an MRI to further characterize this lesion. If she is willing I can place an order. Please make sure she does not have any metal in her body or a pacemaker. Thanks.

## 2016-06-27 NOTE — Telephone Encounter (Signed)
Left message to return call 

## 2016-07-03 NOTE — Telephone Encounter (Signed)
Left message to return call 

## 2016-07-03 NOTE — Telephone Encounter (Signed)
Patient notified and would like MRI, patient does not have metal or pacemaker.

## 2016-07-11 NOTE — Telephone Encounter (Signed)
Order placed. We will have Melissa check with radiology to ensure this is the correct imaging study for a soft tissue solid mass in the lower leg. Thanks.

## 2016-07-11 NOTE — Addendum Note (Signed)
Addended by: Caryl Bis Reace Breshears G on: 07/11/2016 12:58 PM   Modules accepted: Orders

## 2016-07-17 ENCOUNTER — Other Ambulatory Visit: Payer: Self-pay | Admitting: Family Medicine

## 2016-07-17 DIAGNOSIS — L989 Disorder of the skin and subcutaneous tissue, unspecified: Secondary | ICD-10-CM

## 2016-07-18 ENCOUNTER — Encounter: Payer: Self-pay | Admitting: Family Medicine

## 2016-07-25 ENCOUNTER — Other Ambulatory Visit: Payer: Self-pay | Admitting: Family Medicine

## 2016-07-27 ENCOUNTER — Other Ambulatory Visit: Payer: Self-pay | Admitting: Family Medicine

## 2016-07-27 DIAGNOSIS — M799 Soft tissue disorder, unspecified: Secondary | ICD-10-CM

## 2016-08-16 ENCOUNTER — Telehealth: Payer: Self-pay

## 2016-08-16 NOTE — Telephone Encounter (Signed)
Left message to return call 

## 2016-08-16 NOTE — Telephone Encounter (Signed)
-----   Message from Leone Haven, MD sent at 08/16/2016 12:32 PM EDT ----- Regarding: FW: mri Please contact the patient to see when she is going to come in for the x-ray that was previously discussed. Thanks. Randall Hiss.    ----- Message ----- From: Leeanne Rio, CMA Sent: 08/11/2016   9:27 AM To: Leone Haven, MD Subject: RE: mri                                        This patient has not came in for her xray yet.  ----- Message ----- From: Leone Haven, MD Sent: 07/27/2016  12:54 PM To: Leeanne Rio, CMA, Arby Barrette, RT, # Subject: RE: mri                                        I have placed the order for this. She will need to have a urine pregnancy test prior to having this done. I will cc the lab as well. Thanks. Randall Hiss. ----- Message ----- From: Eustace Pen Sent: 07/27/2016  10:14 AM To: Leone Haven, MD Subject: RE: Darlina Guys returned my call and stated that she is agreeable to xray. She said that she will come here to do it some time next week. Please enter the order. Thank you, Melissa ----- Message ----- From: Leone Haven, MD Sent: 07/25/2016  12:53 PM To: Melissa B Tuck Subject: RE: mri                                        Thanks. Did they give a particular reason? Randall Hiss.  ----- Message ----- From: Eustace Pen Sent: 07/25/2016  12:01 PM To: Leone Haven, MD Subject: mri                                            Dr. Caryl Bis, [employer] has denied the MRI that was ordered. I will let her know. Thanks, [employer]

## 2016-08-18 NOTE — Telephone Encounter (Signed)
Left message to return call 

## 2016-08-22 NOTE — Telephone Encounter (Signed)
Please mail a letter asking her to call the office. Thanks.

## 2016-08-22 NOTE — Telephone Encounter (Signed)
Mailed letter °

## 2016-08-22 NOTE — Telephone Encounter (Signed)
Unable to reach patient.

## 2016-08-23 NOTE — Telephone Encounter (Signed)
Patient states she will come in next week to have this done

## 2016-09-04 ENCOUNTER — Ambulatory Visit (INDEPENDENT_AMBULATORY_CARE_PROVIDER_SITE_OTHER): Payer: Managed Care, Other (non HMO)

## 2016-09-04 ENCOUNTER — Ambulatory Visit: Payer: Managed Care, Other (non HMO)

## 2016-09-04 DIAGNOSIS — M799 Soft tissue disorder, unspecified: Secondary | ICD-10-CM

## 2016-09-06 ENCOUNTER — Other Ambulatory Visit: Payer: Self-pay | Admitting: Family Medicine

## 2016-09-19 ENCOUNTER — Ambulatory Visit: Payer: Managed Care, Other (non HMO) | Admitting: Family Medicine

## 2016-09-19 ENCOUNTER — Telehealth: Payer: Self-pay | Admitting: Family Medicine

## 2016-09-19 NOTE — Telephone Encounter (Signed)
Submitted authorization for MRI of her lower extremity since she came in to do her xray's. Her insurance still denied it. It stated that it was denied because her records did not show enough details for them to help your doctor decide if imaging would be needed. Please let me know how you would like me to proceed.

## 2016-09-19 NOTE — Telephone Encounter (Signed)
We could offer her referral to orthopedics to see what they think. I can place a referral if she is okay with this. Thanks.

## 2016-10-04 ENCOUNTER — Ambulatory Visit: Payer: Managed Care, Other (non HMO) | Admitting: Family Medicine

## 2016-10-09 ENCOUNTER — Encounter: Payer: Self-pay | Admitting: Family Medicine

## 2016-10-11 ENCOUNTER — Encounter: Payer: Self-pay | Admitting: Family Medicine

## 2016-10-12 ENCOUNTER — Other Ambulatory Visit: Payer: Self-pay | Admitting: Family Medicine

## 2016-10-12 DIAGNOSIS — K649 Unspecified hemorrhoids: Secondary | ICD-10-CM

## 2016-11-17 ENCOUNTER — Ambulatory Visit
Admission: RE | Admit: 2016-11-17 | Discharge: 2016-11-17 | Disposition: A | Discharge status: Home / Self Care | Payer: Managed Care, Other (non HMO) | Source: Ambulatory Visit | Attending: Family Medicine | Admitting: Family Medicine

## 2016-11-17 ENCOUNTER — Encounter: Payer: Self-pay | Admitting: Family Medicine

## 2016-11-17 ENCOUNTER — Ambulatory Visit (INDEPENDENT_AMBULATORY_CARE_PROVIDER_SITE_OTHER): Payer: Managed Care, Other (non HMO) | Admitting: Family Medicine

## 2016-11-17 VITALS — BP 114/70 | HR 58 | Temp 98.2°F | Wt 260.6 lb

## 2016-11-17 DIAGNOSIS — F3289 Other specified depressive episodes: Secondary | ICD-10-CM

## 2016-11-17 DIAGNOSIS — M799 Soft tissue disorder, unspecified: Secondary | ICD-10-CM | POA: Diagnosis not present

## 2016-11-17 DIAGNOSIS — M7989 Other specified soft tissue disorders: Secondary | ICD-10-CM

## 2016-11-17 DIAGNOSIS — I8392 Asymptomatic varicose veins of left lower extremity: Secondary | ICD-10-CM | POA: Insufficient documentation

## 2016-11-17 DIAGNOSIS — J0100 Acute maxillary sinusitis, unspecified: Secondary | ICD-10-CM | POA: Diagnosis not present

## 2016-11-17 DIAGNOSIS — J329 Chronic sinusitis, unspecified: Secondary | ICD-10-CM | POA: Insufficient documentation

## 2016-11-17 MED ORDER — DOXYCYCLINE HYCLATE 100 MG PO TABS: 100.0000 mg | ORAL_TABLET | Freq: Two times a day (BID) | ORAL | 0 refills | Status: DC
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
## 2016-11-17 NOTE — Assessment & Plan Note (Signed)
Remains present. It appears that her insurance continued to decline the request for MRI. We'll try to get this approved again. If not able to get it approved we could have her see a surgeon as offered previously in a prior result note.

## 2016-11-17 NOTE — Patient Instructions (Signed)
Nice to see you. Please start the doxycycline for your sinus infection. We'll check on getting the MRI. Please go get the ultrasound of your leg. If you develop chest pain or shortness of breath please seek medical attention immediately.

## 2016-11-17 NOTE — Assessment & Plan Note (Signed)
Symptoms most consistent with sinusitis given duration. We'll treat with doxycycline. Given return precautions.

## 2016-11-17 NOTE — Assessment & Plan Note (Signed)
Very mild. She wants to avoid medication at this time. She'll continue to monitor.

## 2016-11-17 NOTE — Assessment & Plan Note (Signed)
Patient was somewhat chronic left leg swelling. Potentially could be a blood clot given her family history. Ultrasound will be obtained to evaluate this further. Potentially could be venous insufficiency. We'll determine next step in management following ultrasound.

## 2016-11-20 ENCOUNTER — Encounter: Payer: Self-pay | Admitting: Family Medicine

## 2016-11-20 ENCOUNTER — Telehealth: Payer: Self-pay | Admitting: Family Medicine

## 2016-11-20 NOTE — Telephone Encounter (Signed)
Pt called back regarding imaging. Thank you!  Call pt @ [facility] and then cell [phone].

## 2016-11-20 NOTE — Telephone Encounter (Signed)
See result note.  

## 2016-11-22 ENCOUNTER — Telehealth: Payer: Self-pay | Admitting: Family Medicine

## 2016-11-22 NOTE — Telephone Encounter (Signed)
See result note.  

## 2016-11-22 NOTE — Telephone Encounter (Signed)
Pt called back returning your call. Please advise, thank you!  Call pt @ [phone]

## 2016-11-24 NOTE — Telephone Encounter (Signed)
Great. Thank you for getting this scheduled and approved. Please make sure the patient knows.

## 2016-11-24 NOTE — Telephone Encounter (Signed)
Her MRI has been approved through her insurance and she is now scheduled for 10/5!

## 2016-12-01 ENCOUNTER — Ambulatory Visit: Payer: Managed Care, Other (non HMO)

## 2016-12-08 ENCOUNTER — Ambulatory Visit: Payer: Managed Care, Other (non HMO)

## 2016-12-13 ENCOUNTER — Ambulatory Visit
Admission: RE | Admit: 2016-12-13 | Discharge: 2016-12-13 | Disposition: A | Discharge status: Home / Self Care | Payer: Managed Care, Other (non HMO) | Source: Ambulatory Visit | Attending: Family Medicine | Admitting: Family Medicine

## 2016-12-13 DIAGNOSIS — L989 Disorder of the skin and subcutaneous tissue, unspecified: Secondary | ICD-10-CM | POA: Diagnosis not present

## 2016-12-13 DIAGNOSIS — R2242 Localized swelling, mass and lump, left lower limb: Secondary | ICD-10-CM | POA: Diagnosis not present

## 2016-12-13 MED ORDER — GADOBENATE DIMEGLUMINE 529 MG/ML IV SOLN
20.0000 mL | Freq: Once | INTRAVENOUS | Status: AC | PRN
  Administered 2016-12-13: 20 mL via INTRAVENOUS

## 2016-12-20 ENCOUNTER — Other Ambulatory Visit: Payer: Self-pay | Admitting: Family Medicine

## 2016-12-20 DIAGNOSIS — M7989 Other specified soft tissue disorders: Secondary | ICD-10-CM

## 2016-12-28 ENCOUNTER — Telehealth: Payer: Self-pay | Admitting: Family Medicine

## 2016-12-28 NOTE — Telephone Encounter (Signed)
Pt left a vm regarding the biopsy she had she was inquiring if the surgeon was contacted. Please advise? Thank you!  Call pt @ [phone] or [phone] cell.

## 2016-12-28 NOTE — Telephone Encounter (Signed)
Patient states she has not heard anything from the surgeon, patient would like for them to call her cell phone number

## 2016-12-28 NOTE — Telephone Encounter (Signed)
Please advise 

## 2016-12-28 NOTE — Telephone Encounter (Signed)
Referral was sent to [facility] on 10/24. I will call them to see the status of her referral.

## 2016-12-28 NOTE — Telephone Encounter (Signed)
I placed a referral previously. Please see what she means. If she has not heard anything please forward to [facility] to get set up with gen surgery.

## 2016-12-29 NOTE — Telephone Encounter (Signed)
Thank you for checking into this

## 2016-12-29 NOTE — Telephone Encounter (Signed)
Per [facility] pt was called on 10/25 and was lvm to rtc to [facility] to schedule. They have not received a call yet. I lvm for pt with contact number for her to call to schedule.

## 2017-01-03 ENCOUNTER — Ambulatory Visit: Payer: Managed Care, Other (non HMO) | Admitting: General Surgery

## 2017-01-03 ENCOUNTER — Encounter: Payer: Self-pay | Admitting: General Surgery

## 2017-01-03 VITALS — BP 130/74 | HR 72 | Resp 12 | Ht 67.0 in | Wt 254.0 lb

## 2017-01-03 DIAGNOSIS — R229 Localized swelling, mass and lump, unspecified: Secondary | ICD-10-CM | POA: Diagnosis not present

## 2017-01-03 NOTE — Progress Notes (Signed)
Patient ID: Jill Hawkins, female   DOB: 02/09/1976, 41 y.o.   MRN: [id number]  Chief Complaint  Patient presents with  . Other    HPI Jill Hawkins is a 41 y.o. female here today for a evalaution of a mass on left anterior leg. Patient states she noticed this area about five years ago. She states the area has got bigger with time. It bothers her because it presses against her shin which is painful.  Imaging has been performed in the past.    HPI  Past Medical History:  Diagnosis Date  . Frequent headaches   . GERD (gastroesophageal reflux disease)   . History of fainting spells of unknown cause   . Hypertension   . Kidney stones   . Migraine   . UTI (lower urinary tract infection)     Past Surgical History:  Procedure Laterality Date  . CARPAL TUNNEL RELEASE  2005  . CHOLECYSTECTOMY  2015  . 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 GROWTH PLATE SURGERY  7829   Right hip    Family History  Problem Relation Age of Onset  . Arthritis Mother   . Hypertension Mother   . Mental illness Mother   . Diabetes Mother   . Alcohol abuse Father   . Cancer Father   . Hypertension Father   . Mental illness Brother   . Heart disease Maternal Grandmother   . Hypertension Maternal Grandmother   . Mental illness Maternal Grandmother   . Diabetes Maternal Grandmother   . Cancer Maternal Grandfather   . Hypertension Maternal Grandfather   . Mental illness Paternal Grandmother     Social History Social History   Tobacco Use  . Smoking status: Never Smoker  . Smokeless tobacco: Never Used  Substance Use Topics  . Alcohol use: No    Alcohol/week: 0.0 oz  . Drug use: No    Allergies  Allergen Reactions  . Penicillins Rash and Swelling  . Oxycodone-Acetaminophen Rash  . Tramadol Anxiety and Itching  . Codeine Other (See Comments)    Chest pains  . Tape Hives    Current Outpatient Medications  Medication Sig Dispense Refill  . ALPRAZolam (XANAX) 1 MG tablet Take 1 mg by mouth as  needed for anxiety.    . B Complex Vitamins (VITAMIN-B COMPLEX) TABS Take by mouth.    . Cetirizine HCl 10 MG CAPS Take by mouth.    . cyanocobalamin (,VITAMIN B-12,) 1000 MCG/ML injection Inject 1,000 mcg into the muscle.     . cyclobenzaprine (FLEXERIL) 10 MG tablet Take 10 mg by mouth 3 (three) times daily as needed for muscle spasms.    . fluticasone (FLONASE) 50 MCG/ACT nasal spray Place 2 sprays into both nostrils daily. 16 g 6  . folic acid (FOLVITE) 1 MG tablet Take 1 mg by mouth daily.    . hydrocortisone (ANUSOL-HC) 2.5 % rectal cream Place 1 application rectally 2 (two) times daily. 30 g 0  . levonorgestrel (LILETTA) 18.6 MCG/DAY IUD IUD by Intrauterine route.    Marland Kitchen LYRICA 75 MG capsule     . methocarbamol (ROBAXIN) 750 MG tablet Take 750 mg by mouth 2 (two) times daily.    . montelukast (SINGULAIR) 10 MG tablet Take 1 tablet (10 mg total) by mouth at bedtime. 30 tablet 3  . morphine 10 MG/5ML solution     . Multiple Vitamin (MULTI-VITAMINS) TABS Take by mouth.    . nystatin-triamcinolone ointment (MYCOLOG) Apply 1 application topically 2 (  two) times daily. 30 g 0  . ondansetron (ZOFRAN-ODT) 4 MG disintegrating tablet     . pregabalin (LYRICA) 75 MG capsule Take 75 mg by mouth 3 (three) times daily.     No current facility-administered medications for this visit.     Review of Systems Review of Systems  Constitutional: Negative.   Respiratory: Negative.   Cardiovascular: Negative.     Blood pressure 130/74, pulse 72, resp. rate 12, height 5\' 7"  (1.702 m), weight 254 lb (115.2 kg).  Physical Exam Physical Exam  Constitutional: She is oriented to person, place, and time. She appears well-developed and well-nourished.  Neurological: She is alert and oriented to person, place, and time.  Skin: Skin is warm and dry.       Data Reviewed Notes, [place], and MRI reviewed MR 12/13/16 revealed a 15 mm nodular area in the subcutaneous fat likely reflecting an area of fat necrosis.    [place] 11/17/16 was negative for DVT, superficial patent left posterior calf varicose veins, and a chronic 2.5 cm left anterior shin intermediate superficial soft tissue mass.   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 No infectious signs. The mass is in close proximity to the tibia which causes the pt to have symptoms, therefore recommend excision of the mass.  Plan    Patient to be scheduled for left leg excision. The patient is aware to call back for any questions or concerns.     HPI, Physical Exam, Assessment and Plan have been scribed under the direction and in the presence of Mckinley Jewel, MD  Gaspar Cola, CMA  I have completed the exam and reviewed the above documentation for accuracy and completeness.  I agree with the above.  Haematologist has been used and any errors in dictation or transcription are unintentional.  Cesilia Shinn G. Jamal Collin, M.D., F.A.C.S.   Junie Panning G 01/05/2017, 12:12 PM

## 2017-01-03 NOTE — Patient Instructions (Signed)
Patient to be scheduled for left leg excision. The patient is aware to call back for any questions or concerns.

## 2017-01-23 ENCOUNTER — Other Ambulatory Visit: Payer: Self-pay | Admitting: Family Medicine

## 2017-01-29 ENCOUNTER — Encounter: Payer: Self-pay | Admitting: General Surgery

## 2017-01-29 ENCOUNTER — Ambulatory Visit (INDEPENDENT_AMBULATORY_CARE_PROVIDER_SITE_OTHER): Payer: Managed Care, Other (non HMO) | Admitting: General Surgery

## 2017-01-29 VITALS — BP 130/70 | HR 74 | Resp 12 | Ht 67.0 in | Wt 250.0 lb

## 2017-01-29 DIAGNOSIS — R229 Localized swelling, mass and lump, unspecified: Secondary | ICD-10-CM

## 2017-01-29 NOTE — Patient Instructions (Addendum)
CARE AFTER  BIOPSY/EXCISION  1. Leave the dressing on that your doctor applied after the biopsy. It is waterproof. You may bathe, shower and/or swim. The dressing can be removed in 3 days, after ward apply antibiotic ointment and bandage. You may use an ice pack on and off for the first 12-24 hours for comfort.  2. You may want to use a gauze,cloth or similar protection to prevent rubbing against your dressing and incision. This is not necessary, but you may feel more comfortable doing so.  3. You may feel hard and lumpy under the incision. Do not be alarmed. This is the underlying stitching of tissue. Softening of this tissue will occur in time.  4. We will call you with the results.  5. You may have a follow up appointment in two weeks after your biopsy. The office phone number is [phone].  6. Report to your doctor any of the following:  * Severe pain not relieved by your pain medication  *Redness of the incision  * Drainage from the incision  *Fever greater than 101 degrees

## 2017-01-29 NOTE — Progress Notes (Signed)
Patient ID: Jill Hawkins, female   DOB: 1975-07-27, 41 y.o.   MRN: [id number]  Chief Complaint  Patient presents with  . Procedure    HPI Jill Hawkins is a 41 y.o. female here for a scheduled excision of a mass from her left lower leg.                                                                  HPI  Past Medical History:  Diagnosis Date  . Frequent headaches   . GERD (gastroesophageal reflux disease)   . History of fainting spells of unknown cause   . Hypertension   . Kidney stones   . Migraine   . UTI (lower urinary tract infection)     Past Surgical History:  Procedure Laterality Date  . CARPAL TUNNEL RELEASE  2005  . CHOLECYSTECTOMY  2015  . 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 GROWTH PLATE SURGERY  5916   Right hip    Family History  Problem Relation Age of Onset  . Arthritis Mother   . Hypertension Mother   . Mental illness Mother   . Diabetes Mother   . Alcohol abuse Father   . Cancer Father   . Hypertension Father   . Mental illness Brother   . Heart disease Maternal Grandmother   . Hypertension Maternal Grandmother   . Mental illness Maternal Grandmother   . Diabetes Maternal Grandmother   . Cancer Maternal Grandfather   . Hypertension Maternal Grandfather   . Mental illness Paternal Grandmother     Social History Social History   Tobacco Use  . Smoking status: Never Smoker  . Smokeless tobacco: Never Used  Substance Use Topics  . Alcohol use: No    Alcohol/week: 0.0 oz  . Drug use: No    Allergies  Allergen Reactions  . Penicillins Rash and Swelling  . Oxycodone-Acetaminophen Rash  . Tramadol Anxiety and Itching  . Codeine Other (See Comments)    Chest pains  . Tape Hives    Current Outpatient Medications  Medication Sig Dispense Refill  . ALPRAZolam (XANAX) 1 MG tablet Take 1 mg by mouth as needed for anxiety.    . B Complex Vitamins (VITAMIN-B COMPLEX) TABS Take by mouth.    . Cetirizine HCl 10 MG CAPS Take by mouth.    .  cyanocobalamin (,VITAMIN B-12,) 1000 MCG/ML injection Inject 1,000 mcg into the muscle.     . cyclobenzaprine (FLEXERIL) 10 MG tablet Take 10 mg by mouth 3 (three) times daily as needed for muscle spasms.    . fluticasone (FLONASE) 50 MCG/ACT nasal spray Place 2 sprays into both nostrils daily. 16 g 6  . folic acid (FOLVITE) 1 MG tablet Take 1 mg by mouth daily.    . hydrocortisone (ANUSOL-HC) 2.5 % rectal cream Place 1 application rectally 2 (two) times daily. 30 g 0  . levonorgestrel (LILETTA) 18.6 MCG/DAY IUD IUD by Intrauterine route.    Marland Kitchen LYRICA 75 MG capsule     . methocarbamol (ROBAXIN) 750 MG tablet Take 750 mg by mouth 2 (two) times daily.    . montelukast (SINGULAIR) 10 MG tablet Take 1 tablet (10 mg total) by mouth at bedtime. 30 tablet 3  . morphine 10 MG/5ML solution     .  Multiple Vitamin (MULTI-VITAMINS) TABS Take by mouth.    . nystatin-triamcinolone ointment (MYCOLOG) Apply 1 application topically 2 (two) times daily. 30 g 0  . ondansetron (ZOFRAN-ODT) 4 MG disintegrating tablet     . pregabalin (LYRICA) 75 MG capsule Take 75 mg by mouth 3 (three) times daily.     No current facility-administered medications for this visit.     Review of Systems Review of Systems  Constitutional: Negative.   Respiratory: Negative.   Cardiovascular: Negative.     Blood pressure 130/70, pulse 74, resp. rate 12, height 5\' 7"  (1.702 m), weight 250 lb (113.4 kg).  Physical Exam Physical Exam 1.5cm hard cutaneous mass mid left leg just medial to tibia Data Reviewed Prior note  Assessment    Symptomatic nodule left leg likely fat necrosis.    Plan   With the patient consented to excision of this nodule was performed. The area was then marked for incision of about 1.5-2 cm.  Local anesthetic of 1% Xylocaine mixed with 0.5% Marcaine was then instilled and an adequate block obtained.  Area was prepped with ChloraPrep and draped out with sterile towels Vertical elliptical incision was  made and deepened through the skin and part of the subcutaneous tissue where the nodule was extending to.  This was completely removed and sent to pathology in formalin Hemostasis obtained with cautery.  Couple of 3-0 Vicryl stitches were placed in the cutaneous tissue and the skin was then closed with interrupted mattress sutures of 4-0 Prolene. Neosporin ointment Telfa and Tegaderm were placed.  No immediate problems from the procedure noted And advised on wound care Suture removal in about 10-14 days   Will call the patient with pathology results To follow up in 2 weeks for suture removal    HPI, Physical Exam, Assessment and Plan have been scribed under the direction and in the presence of Mckinley Jewel, MD  Concepcion Living, LPN I have completed the exam and reviewed the above documentation for accuracy and completeness.  I agree with the above.  Haematologist has been used and any errors in dictation or transcription are unintentional.  Seeplaputhur G. Jamal Collin, M.D., F.A.C.S.   Junie Panning G 01/31/2017, 3:52 PM

## 2017-02-01 ENCOUNTER — Telehealth: Payer: Self-pay | Admitting: *Deleted

## 2017-02-01 NOTE — Telephone Encounter (Signed)
-----   Message from Christene Lye, MD sent at 02/01/2017  7:15 AM EST ----- Please inform pt-path showed fat necrosis. This is benign

## 2017-02-01 NOTE — Telephone Encounter (Signed)
Notified patient as instructed, patient pleased. Discussed follow-up appointments, patient agrees  

## 2017-02-14 ENCOUNTER — Ambulatory Visit (INDEPENDENT_AMBULATORY_CARE_PROVIDER_SITE_OTHER): Payer: Managed Care, Other (non HMO) | Admitting: *Deleted

## 2017-02-14 DIAGNOSIS — R229 Localized swelling, mass and lump, unspecified: Secondary | ICD-10-CM

## 2017-02-14 NOTE — Patient Instructions (Signed)
Return as needed, 

## 2017-02-14 NOTE — Progress Notes (Signed)
Patient came in today for a wound check.  The wound is clean, with no signs of infection noted. The sutures were removed and steri strips applied.  

## 2017-02-26 ENCOUNTER — Encounter: Payer: Self-pay | Admitting: Family Medicine

## 2017-03-12 ENCOUNTER — Ambulatory Visit (INDEPENDENT_AMBULATORY_CARE_PROVIDER_SITE_OTHER): Payer: Managed Care, Other (non HMO) | Admitting: Family Medicine

## 2017-03-12 ENCOUNTER — Encounter: Payer: Self-pay | Admitting: Family Medicine

## 2017-03-12 ENCOUNTER — Other Ambulatory Visit: Payer: Self-pay

## 2017-03-12 VITALS — BP 110/78 | HR 59 | Temp 98.1°F | Wt 251.0 lb

## 2017-03-12 DIAGNOSIS — M5441 Lumbago with sciatica, right side: Secondary | ICD-10-CM | POA: Diagnosis not present

## 2017-03-12 DIAGNOSIS — G8929 Other chronic pain: Secondary | ICD-10-CM

## 2017-03-12 MED ORDER — PREDNISONE 10 MG PO TABS: ORAL_TABLET | ORAL | 0 refills | Status: DC

## 2017-03-12 NOTE — Assessment & Plan Note (Signed)
Chronic issue.  Currently in exacerbation.  Suspect either nerve impingement or muscular strain.  Will treat with a prednisone taper.  Discussed monitoring drowsiness with Flexeril and Robaxin that is prescribed by her pain management specialist.  Given persistence of pain we will obtain an MRI.  She is given return precautions.

## 2017-03-12 NOTE — Progress Notes (Signed)
[name], MD Phone: [phone]  Jill Hawkins is a 42 y.o. female who presents today for follow-up.  Patient presents for low back pain.  This has been going on since October.  She does have chronic low back pain and it worsened at that time.  Was so bad that she had difficulty getting up to move due to the discomfort.  She was seen in the emergency department and reports they gave her prednisone with some improvement.  She is seeing her pain specialist as well and they felt it was related to increased muscle spasm and they changed her Flexeril dosing.  She is also on Robaxin and Lyrica.  She notes it feels like a sharp shooting discomfort.  Does have discomfort radiating down her posterior right leg.  Occasional numbness in that area as well though these things have been chronic for several years.  No saddle anesthesia or bowel or bladder incontinence.  She has been referred to physical therapy and is awaiting insurance approval.  Social History   Tobacco Use  Smoking Status Never Smoker  Smokeless Tobacco Never Used     ROS see history of present illness  Objective  Physical Exam Vitals:   03/12/17 1513  BP: 110/78  Pulse: (!) 59  Temp: 98.1 F (36.7 C)  SpO2: 99%    BP Readings from Last 3 Encounters:  03/12/17 110/78  01/29/17 130/70  01/03/17 130/74   Wt Readings from Last 3 Encounters:  03/12/17 251 lb (113.9 kg)  01/29/17 250 lb (113.4 kg)  01/03/17 254 lb (115.2 kg)    Physical Exam  Constitutional: No distress.  Cardiovascular: Normal rate, regular rhythm and normal heart sounds.  Pulmonary/Chest: Effort normal and breath sounds normal.  Musculoskeletal: She exhibits no edema.  No midline spine tenderness, no midline spine step-off, there is muscular back tenderness in the lumbar paraspinous muscles as well as other musculature, no overlying skin changes  Neurological: She is alert. Gait normal.  5/5 strength bilateral quads, hamstrings,  plantarflexion, and dorsiflexion, sensation light touch intact bilateral lower extremities  Skin: Skin is warm and dry. She is not diaphoretic.     Assessment/Plan: Please see individual problem list.  Back pain Chronic issue.  Currently in exacerbation.  Suspect either nerve impingement or muscular strain.  Will treat with a prednisone taper.  Discussed monitoring drowsiness with Flexeril and Robaxin that is prescribed by her pain management specialist.  Given persistence of pain we will obtain an MRI.  She is given return precautions.   Orders Placed This Encounter  Procedures  . MR Lumbar Spine Wo Contrast    Standing Status:   Future    Standing Expiration Date:   05/11/2018    Order Specific Question:   What is the patient's sedation requirement?    Answer:   No Sedation    Order Specific Question:   Does the patient have a pacemaker or implanted devices?    Answer:   No    Order Specific Question:   Preferred imaging location?    Answer:   [facility] (table limit-350lbs)    Order Specific Question:   Radiology Contrast Protocol - do NOT remove file path    Answer:   [email or web address]    Meds ordered this encounter  Medications  . predniSONE (DELTASONE) 10 MG tablet    Sig: Take 40 mg (4 tablets) by mouth daily for 4 days, then decrease by 1 tablet daily until gone    Dispense:  22  tablet    Refill:  0     [name], MD [place]

## 2017-03-12 NOTE — Patient Instructions (Signed)
Nice to see you. We will get you set up with MRI of your low back to evaluate for cause of your discomfort. We will treat you with a prednisone taper. Please do the physical therapy as previously advised. If you develop weakness, loss of bowel or bladder function, numbness between your legs, or any new or changing symptoms please seek medical attention immediately.

## 2017-03-16 ENCOUNTER — Encounter: Payer: Self-pay | Admitting: Family Medicine

## 2017-03-16 DIAGNOSIS — M5441 Lumbago with sciatica, right side: Principal | ICD-10-CM

## 2017-03-16 DIAGNOSIS — G8929 Other chronic pain: Secondary | ICD-10-CM

## 2017-03-16 NOTE — Telephone Encounter (Signed)
Copied from [place] [id number]. Topic: Quick Communication - See Telephone Encounter >> Mar 16, 2017  3:12 PM Robina Ade, Helene Kelp D wrote: CRM for notification. See Telephone encounter for: 03/16/17. Patient called back to check if Dr. Caryl Bis received her message this morning. She would like a call back, thanks.

## 2017-03-16 NOTE — Telephone Encounter (Signed)
Please advise  Lenna Sciara is working on the MRI it is still pending

## 2017-03-26 ENCOUNTER — Telehealth: Payer: Self-pay | Admitting: Family Medicine

## 2017-03-26 NOTE — Telephone Encounter (Signed)
fyi

## 2017-03-26 NOTE — Telephone Encounter (Signed)
Please follow-up with the patient to see if there is anything else she needed.

## 2017-03-26 NOTE — Telephone Encounter (Signed)
Copied from [place]. Topic: Inquiry >> Mar 26, 2017 12:20 PM Malena Catholic I, [place] wrote: Reason for CRM: Pt call and said her back is en pain,and she need to Talk to some one Today,her cell phone is [phone]

## 2017-03-26 NOTE — Telephone Encounter (Signed)
Talked to [facility] She is in the process of seeing if MRI has been approved so it can be scheduled. Will follow up.

## 2017-03-26 NOTE — Telephone Encounter (Signed)
Melissa submitted it to [employer] ins for [place].

## 2017-04-04 NOTE — Addendum Note (Signed)
Addended by: Leone Haven on: 04/04/2017 04:59 PM   Modules accepted: Orders

## 2017-04-05 ENCOUNTER — Telehealth: Payer: Self-pay | Admitting: Family Medicine

## 2017-04-05 NOTE — Telephone Encounter (Signed)
Thank you. Can you or Rasheedah let the patient know?

## 2017-04-05 NOTE — Telephone Encounter (Signed)
Called insurance as requested and information was given from patient documentation in My chart message . Authorization was attained the authorization ID is [id number] which will expire on 06/20/17 and was approved from 03/22/17. FYI notified referrals.

## 2017-04-06 NOTE — Telephone Encounter (Signed)
Per patient DPR called and left detailed message.

## 2017-04-12 ENCOUNTER — Ambulatory Visit: Payer: Managed Care, Other (non HMO)

## 2017-04-13 ENCOUNTER — Other Ambulatory Visit: Payer: Self-pay | Admitting: Family Medicine

## 2017-04-13 ENCOUNTER — Encounter: Payer: Self-pay | Admitting: Family Medicine

## 2017-04-13 ENCOUNTER — Ambulatory Visit: Payer: Managed Care, Other (non HMO)

## 2017-04-13 MED ORDER — OSELTAMIVIR PHOSPHATE 75 MG PO CAPS: 75.0000 mg | ORAL_CAPSULE | Freq: Two times a day (BID) | ORAL | 0 refills | Status: DC

## 2017-04-13 NOTE — Telephone Encounter (Signed)
Pt is calling to check on the status of hopefully getting tamiflu called in. Please advise.

## 2017-04-13 NOTE — Telephone Encounter (Signed)
Left message to return call to verify if she did receive the mychart message. [facility] for pec to speak to patient

## 2017-04-13 NOTE — Telephone Encounter (Signed)
I sent her a my chart message and already sent the Tamiflu in.  Please see the my chart message and call the patient to relay the information.

## 2017-04-23 ENCOUNTER — Ambulatory Visit
Admission: RE | Admit: 2017-04-23 | Discharge: 2017-04-23 | Disposition: A | Discharge status: Home / Self Care | Payer: Managed Care, Other (non HMO) | Source: Ambulatory Visit | Attending: Family Medicine | Admitting: Family Medicine

## 2017-04-23 ENCOUNTER — Encounter: Payer: Self-pay | Admitting: Family

## 2017-04-23 ENCOUNTER — Ambulatory Visit: Payer: Managed Care, Other (non HMO)

## 2017-04-23 DIAGNOSIS — G8929 Other chronic pain: Secondary | ICD-10-CM | POA: Diagnosis not present

## 2017-04-23 DIAGNOSIS — M5441 Lumbago with sciatica, right side: Secondary | ICD-10-CM

## 2017-04-23 DIAGNOSIS — M5117 Intervertebral disc disorders with radiculopathy, lumbosacral region: Secondary | ICD-10-CM | POA: Diagnosis not present

## 2017-05-01 ENCOUNTER — Encounter: Payer: Self-pay | Admitting: Family Medicine

## 2017-05-01 NOTE — Telephone Encounter (Signed)
Please print this out and fill in with the information from the prior form. I can then sign this. Thanks.

## 2017-05-11 LAB — HM PAP SMEAR: HM Pap smear: NEGATIVE

## 2017-05-17 ENCOUNTER — Ambulatory Visit (INDEPENDENT_AMBULATORY_CARE_PROVIDER_SITE_OTHER): Payer: Managed Care, Other (non HMO) | Admitting: Family Medicine

## 2017-05-17 ENCOUNTER — Other Ambulatory Visit: Payer: Self-pay

## 2017-05-17 ENCOUNTER — Encounter: Payer: Self-pay | Admitting: Family Medicine

## 2017-05-17 VITALS — BP 98/60 | HR 62 | Temp 98.1°F | Ht 68.5 in | Wt 253.2 lb

## 2017-05-17 DIAGNOSIS — Z1329 Encounter for screening for other suspected endocrine disorder: Secondary | ICD-10-CM

## 2017-05-17 DIAGNOSIS — F419 Anxiety disorder, unspecified: Secondary | ICD-10-CM | POA: Insufficient documentation

## 2017-05-17 DIAGNOSIS — F32A Depression, unspecified: Secondary | ICD-10-CM | POA: Insufficient documentation

## 2017-05-17 DIAGNOSIS — F329 Major depressive disorder, single episode, unspecified: Secondary | ICD-10-CM | POA: Insufficient documentation

## 2017-05-17 DIAGNOSIS — Z6837 Body mass index (BMI) 37.0-37.9, adult: Secondary | ICD-10-CM | POA: Diagnosis not present

## 2017-05-17 DIAGNOSIS — Z13220 Encounter for screening for lipoid disorders: Secondary | ICD-10-CM | POA: Diagnosis not present

## 2017-05-17 DIAGNOSIS — E6609 Other obesity due to excess calories: Secondary | ICD-10-CM | POA: Diagnosis not present

## 2017-05-17 DIAGNOSIS — M5412 Radiculopathy, cervical region: Secondary | ICD-10-CM

## 2017-05-17 DIAGNOSIS — J309 Allergic rhinitis, unspecified: Secondary | ICD-10-CM

## 2017-05-17 DIAGNOSIS — Z130 Encounter for screening for diseases of the blood and blood-forming organs and certain disorders involving the immune mechanism: Secondary | ICD-10-CM

## 2017-05-17 DIAGNOSIS — F320 Major depressive disorder, single episode, mild: Secondary | ICD-10-CM

## 2017-05-17 DIAGNOSIS — Z9884 Bariatric surgery status: Secondary | ICD-10-CM

## 2017-05-17 DIAGNOSIS — Z0001 Encounter for general adult medical examination with abnormal findings: Secondary | ICD-10-CM

## 2017-05-17 LAB — COMPREHENSIVE METABOLIC PANEL
ALK PHOS: 60 U/L (ref 39–117)
ALT: 14 U/L (ref 0–35)
AST: 18 U/L (ref 0–37)
Albumin: 4.1 g/dL (ref 3.5–5.2)
BUN: 10 mg/dL (ref 6–23)
CO2: 28 mEq/L (ref 19–32)
CREATININE: 0.48 mg/dL (ref 0.40–1.20)
Calcium: 9.6 mg/dL (ref 8.4–10.5)
Chloride: 105 mEq/L (ref 96–112)
GFR: 152.25 mL/min (ref >60.00)
GLUCOSE: 94 mg/dL (ref 70–99)
POTASSIUM: 4 meq/L (ref 3.5–5.1)
SODIUM: 141 meq/L (ref 135–145)
TOTAL PROTEIN: 7 g/dL (ref 6.0–8.3)
Total Bilirubin: 0.6 mg/dL (ref 0.2–1.2)

## 2017-05-17 LAB — CBC
HEMATOCRIT: 36.4 % (ref 36.0–46.0)
HEMOGLOBIN: 12.3 g/dL (ref 12.0–15.0)
MCHC: 33.7 g/dL (ref 30.0–36.0)
MCV: 87.2 fl (ref 78.0–100.0)
Platelets: 158 10*3/uL (ref 150.0–400.0)
RBC: 4.17 Mil/uL (ref 3.87–5.11)
RDW: 14.4 % (ref 11.5–15.5)
WBC: 4 10*3/uL (ref 4.0–10.5)

## 2017-05-17 LAB — LIPID PANEL
CHOLESTEROL: 123 mg/dL (ref 0–200)
HDL: 59 mg/dL (ref >39.00)
LDL CALC: 54 mg/dL (ref 0–99)
NonHDL: 63.56
TRIGLYCERIDES: 47 mg/dL (ref 0.0–149.0)
Total CHOL/HDL Ratio: 2
VLDL: 9.4 mg/dL (ref 0.0–40.0)

## 2017-05-17 LAB — TSH: TSH: 1.03 u[IU]/mL (ref 0.35–4.50)

## 2017-05-17 LAB — HEMOGLOBIN A1C: HEMOGLOBIN A1C: 4.8 % (ref 4.6–6.5)

## 2017-05-17 MED ORDER — BUPROPION HCL ER (XL) 150 MG PO TB24: 150.0000 mg | ORAL_TABLET | Freq: Every day | ORAL | 3 refills | Status: DC

## 2017-05-17 NOTE — Assessment & Plan Note (Signed)
Chronic issue.  She is following with pain management for this and her low back pain.  She will continue to see them.  She will plan to see her neurosurgeon for follow-up of her back pain as well.

## 2017-05-17 NOTE — Assessment & Plan Note (Signed)
Patient with depression likely related to her chronic pain.  Hopefully this will improve if she is able to get her back treated by surgery.  In the interim we will start her on Wellbutrin as she has responded to that previously.  She will follow-up in 2 months.  Given return precautions.

## 2017-05-17 NOTE — Patient Instructions (Signed)
Nice to see you. Please see the surgeon as planned. Please watch your scratchy throat and if it worsens or he develop worsening upper respiratory symptoms please let us know. We will treat you with Wellbutrin for your depression.  If you develop thoughts of harming yourself please go to the emergency room. Please work on dietary changes.

## 2017-05-17 NOTE — Assessment & Plan Note (Signed)
Physical exam completed.  Breast exam and pelvic exam done by gynecology last week.  They are working on setting up her mammogram.  If it does not get set up she will contact us and we can arrange for this.  HIV screening up-to-date.  Vaccinations up-to-date.  Encourage dietary changes.  Encouraged trying to stay active.  Lab work as outlined below.

## 2017-05-17 NOTE — Progress Notes (Signed)
Jill Rumps, MD Phone: [phone]  Jill Hawkins is a 42 y.o. female who presents today for cpe.  Not exercising related to back pain. She has been eating more junk food. Saw gynecology last week for Pap smear.  They are going to set her up for mammogram.  She had breast exam at that time. No family history of colon cancer, ovarian cancer, or breast cancer. Had HIV testing around 2004. No tobacco use or illicit drug use.  Occasional alcohol use.  She reports her throat has been a little scratchy the last few days.  She does not feel sick.  No significant postnasal drip.  Does have a history of laryngitis.  Does take allergy medication.  Depression: Notes for at least the last 6 months she has felt down and depressed.  Mostly about her chronic pain related to her back and shoulder.  She has previously been on Wellbutrin in the past which was beneficial.  She notes no SI.  Does note decreased sexual desire related to this.  Depression screen [facility] 2/9 05/17/2017 06/17/2015  Decreased Interest 1 0  Down, Depressed, Hopeless 2 0  PHQ - 2 Score 3 0  Altered sleeping 2 -  Tired, decreased energy 2 -  Change in appetite 1 -  Feeling bad or failure about yourself  2 -  Trouble concentrating 1 -  Moving slowly or fidgety/restless 0 -  Suicidal thoughts 0 -  PHQ-9 Score 11 -  Difficult doing work/chores Very difficult -   GAD 7 : Generalized Anxiety Score 05/17/2017  Nervous, Anxious, on Edge 0  Control/stop worrying 1  Worry too much - different things 1  Trouble relaxing 2  Restless 1  Easily annoyed or irritable 2  Afraid - awful might happen 0  Total GAD 7 Score 7   She has chronic back pain and shoulder pain with associated numbness in her right arm and posterior left leg.  She feels weakness in her legs when she goes to stand up after sitting for a long period of time.  All these things are chronic and unchanged.  She follows with pain management and is on morphine as well  as Flexeril, Robaxin, and Lyrica during the day.  She has an appointment with a surgeon following her recent lumbar spine MRI.  This revealed a very mild disc bulge.  Active Ambulatory Problems    Diagnosis Date Noted  . Right cervical radiculopathy 04/25/2014  . Left knee pain 04/25/2014  . Back pain 06/22/2014  . Urinary frequency 06/22/2014  . Encounter for general adult medical examination with abnormal findings 06/27/2015  . Left lower quadrant pain 11/18/2015  . 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  . Allergic rhinitis 06/20/2016  . Bradycardia 06/20/2016  . Right shoulder pain 06/20/2016  . Candidal intertrigo 06/20/2016  . Hemorrhoids 06/20/2016  . Depression 06/20/2016  . Soft tissue mass 06/20/2016  . Left leg swelling 11/17/2016  . 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 Sinusitis, acute maxillary 03/24/2014  . Encounter for routine gynecological examination 04/25/2014  . Preop examination 12/20/2015  . Viral upper respiratory infection 12/20/2015  . Respiratory infection 03/20/2016  . Sinusitis 11/17/2016   Past Medical History:  Diagnosis Date  . Frequent headaches   . GERD (gastroesophageal reflux disease)   . History of fainting spells of unknown cause   . Hypertension   . Kidney stones   .  Migraine   . UTI (lower urinary tract infection)     Family History  Problem Relation Age of Onset  . Arthritis Mother   . Hypertension Mother   . Mental illness Mother   . Diabetes Mother   . Alcohol abuse Father   . Cancer Father   . Hypertension Father   . Mental illness Brother   . Heart disease Maternal Grandmother   . Hypertension Maternal Grandmother   . Mental illness Maternal Grandmother   . Diabetes Maternal Grandmother   . Cancer Maternal Grandfather   . Hypertension Maternal Grandfather   . Mental illness Paternal Grandmother     Social History    Socioeconomic History  . Marital status: Married    Spouse name: Not on file  . Number of children: Not on file  . Years of education: Not on file  . Highest education level: Not on file  Occupational History  . Not on file  Social Needs  . Financial resource strain: Not on file  . Food insecurity:    Worry: Not on file    Inability: Not on file  . Transportation needs:    Medical: Not on file    Non-medical: Not on file  Tobacco Use  . Smoking status: Never Smoker  . Smokeless tobacco: Never Used  Substance and Sexual Activity  . Alcohol use: No    Alcohol/week: 0.0 oz  . Drug use: No  . Sexual activity: Yes    Partners: Male    Comment: Husband  Lifestyle  . Physical activity:    Days per week: Not on file    Minutes per session: Not on file  . Stress: Not on file  Relationships  . Social connections:    Talks on phone: Not on file    Gets together: Not on file    Attends religious service: Not on file    Active member of club or organization: Not on file    Attends meetings of clubs or organizations: Not on file    Relationship status: Not on file  . Intimate partner violence:    Fear of current or ex partner: Not on file    Emotionally abused: Not on file    Physically abused: Not on file    Forced sexual activity: Not on file  Other Topics Concern  . 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 CV:  Negative for chest pain, dyspnea, edema, palpitations Resp: Negative for cough, dyspnea, hemoptysis GI: Negative for nausea, vomiting, diarrhea,  constipation, abdominal pain, melena, hematochezia. GU: Positive for sexual difficulty, negative for dysuria, incontinence, urinary hesitance, hematuria, vaginal or penile discharge, polyuria, lumps in testicle or breasts MSK: Positive for muscle cramps or aches, joint pain or swelling Neuro: Positive for weakness, numbness, negative for headaches, dizziness, passing out/fainting Psych: Positive for depression, negative for anxiety, memory problems  Objective  Physical Exam Vitals:   05/17/17 0841  BP: 98/60  Pulse: 62  Temp: 98.1 F (36.7 C)  SpO2: 99%  BP Readings from Last 3 Encounters:  05/17/17 98/60  03/12/17 110/78  01/29/17 130/70   Wt Readings from Last 3 Encounters:  05/17/17 253 lb 3.2 oz (114.9 kg)  03/12/17 251 lb (113.9 kg)  01/29/17 250 lb (113.4 kg)    Physical Exam  Constitutional: No distress.  HENT:  Head: Normocephalic and atraumatic.  Mouth/Throat: Oropharynx is clear and moist. No oropharyngeal exudate.  Eyes: Pupils are equal, round, and reactive to light. Conjunctivae are normal.  Neck: Neck supple.  Cardiovascular: Normal rate, regular rhythm and normal heart sounds.  Pulmonary/Chest: Effort normal and breath sounds normal.  Abdominal: Soft. Bowel sounds are normal. She exhibits no distension. There is no tenderness. There is no rebound and no guarding.  Musculoskeletal: She exhibits no edema.  Lymphadenopathy:    She has no cervical adenopathy.  Neurological: She is alert. Gait normal.  Able to rise from a seated position with no difficulty  Skin: Skin is warm and dry. She is not diaphoretic.  Psychiatric:  Mood depressed, affect flat     Assessment/Plan:   Encounter for general adult medical examination with abnormal findings Physical exam completed.  Breast exam and pelvic exam done by gynecology last week.  They are working on setting up her mammogram.  If it does not get set up she will contact us and we can arrange for this.  HIV  screening up-to-date.  Vaccinations up-to-date.  Encourage dietary changes.  Encouraged trying to stay active.  Lab work as outlined below.  Right cervical radiculopathy Chronic issue.  She is following with pain management for this and her low back pain.  She will continue to see them.  She will plan to see her neurosurgeon for follow-up of her back pain as well.  Allergic rhinitis Symptoms possibly allergy related.  Could be the start of laryngitis.  She will monitor.  Discussed salt water gargles and throat lozenges.  Depression, major, single episode, mild ([place]) Patient with depression likely related to her chronic pain.  Hopefully this will improve if she is able to get her back treated by surgery.  In the interim we will start her on Wellbutrin as she has responded to that previously.  She will follow-up in 2 months.  Given return precautions.   Orders Placed This Encounter  Procedures  . Lipid panel  . HgB A1c  . Comp Met (CMET)  . CBC  . TSH    Meds ordered this encounter  Medications  . buPROPion (WELLBUTRIN XL) 150 MG 24 hr tablet    Sig: Take 1 tablet (150 mg total) by mouth daily.    Dispense:  30 tablet    Refill:  [place], MD [place]

## 2017-05-17 NOTE — Assessment & Plan Note (Signed)
Symptoms possibly allergy related.  Could be the start of laryngitis.  She will monitor.  Discussed salt water gargles and throat lozenges.

## 2017-06-05 ENCOUNTER — Encounter: Payer: Self-pay | Admitting: Family Medicine

## 2017-07-17 ENCOUNTER — Telehealth: Payer: Self-pay | Admitting: Family Medicine

## 2017-07-17 ENCOUNTER — Ambulatory Visit: Payer: Managed Care, Other (non HMO) | Admitting: Family Medicine

## 2017-07-17 ENCOUNTER — Other Ambulatory Visit: Payer: Self-pay

## 2017-07-17 ENCOUNTER — Encounter: Payer: Self-pay | Admitting: Family Medicine

## 2017-07-17 VITALS — BP 124/76 | HR 61 | Temp 98.3°F | Wt 250.8 lb

## 2017-07-17 DIAGNOSIS — E162 Hypoglycemia, unspecified: Secondary | ICD-10-CM

## 2017-07-17 DIAGNOSIS — M5412 Radiculopathy, cervical region: Secondary | ICD-10-CM | POA: Diagnosis not present

## 2017-07-17 DIAGNOSIS — F320 Major depressive disorder, single episode, mild: Secondary | ICD-10-CM

## 2017-07-17 DIAGNOSIS — K649 Unspecified hemorrhoids: Secondary | ICD-10-CM | POA: Diagnosis not present

## 2017-07-17 LAB — BASIC METABOLIC PANEL
BUN: 12 mg/dL (ref 6–23)
CHLORIDE: 106 meq/L (ref 96–112)
CO2: 27 meq/L (ref 19–32)
CREATININE: 0.41 mg/dL (ref 0.40–1.20)
Calcium: 8.9 mg/dL (ref 8.4–10.5)
GFR: 182.47 mL/min (ref >60.00)
Glucose, Bld: 89 mg/dL (ref 70–99)
Potassium: 4.1 mEq/L (ref 3.5–5.1)
SODIUM: 139 meq/L (ref 135–145)

## 2017-07-17 MED ORDER — HYDROCORTISONE 2.5 % RE CREA: 1.0000 "application " | TOPICAL_CREAM | Freq: Two times a day (BID) | RECTAL | 0 refills | Status: DC

## 2017-07-17 MED ORDER — BLOOD GLUCOSE MONITOR KIT: PACK | 0 refills | Status: DC

## 2017-07-17 MED ORDER — NYSTATIN-TRIAMCINOLONE 100000-0.1 UNIT/GM-% EX OINT: 1.0000 "application " | TOPICAL_OINTMENT | Freq: Two times a day (BID) | CUTANEOUS | 0 refills | Status: DC

## 2017-07-17 MED ORDER — ALPRAZOLAM 1 MG PO TABS: 1.0000 mg | ORAL_TABLET | Freq: Every day | ORAL | 0 refills | Status: DC | PRN

## 2017-07-17 NOTE — Assessment & Plan Note (Signed)
It really sounds like she is having hypoglycemic episodes in the middle of the night.  This could be related to her bariatric surgery status or inadequate oral intake.  We will check a glucose today.  We will give her a prescription for a glucometer so that she can check when this occurs again.  She will let us know what the blood sugar is.  I did discuss the most important thing when she feels this way as to eat.

## 2017-07-17 NOTE — Telephone Encounter (Signed)
Please let the patient know I heard back from our clinical pharmacist regarding the Wellbutrin.  She noted there are no capsules available of Wellbutrin though the immediate release tablets may be be better absorbed.  It would be dosed 3 times daily though.  If she would like to make this change I can send the new prescription to her pharmacy.  Thanks.

## 2017-07-17 NOTE — Progress Notes (Signed)
[name], MD Phone: [phone]  Jill Hawkins is a 42 y.o. female who presents today for f/u.  CC: depression/anxiety  Depression/anxiety: She notes that depression is a little bit better.  She has been taking the Wellbutrin though she wonders if the capsule would be better given her history of bariatric surgery.  She only occasionally takes Xanax for anxiety particularly with traveling.  No SI.  No drowsiness with the Xanax.  She sees GI on June 3 for her hemorrhoids.  She needs a refill on Anusol.  She notes no bleeding.  She has been following with neurosurgery and pain management for right shoulder discomfort that seems to be cervical radiculopathy.  She will have numbness and tingling from her right scapula into the lateral aspect of her right shoulder and down the lateral aspect of her right upper arm occasionally down to her hand.  No weakness.  She takes morphine, Flexeril, Robaxin, and Lyrica with little benefit.  She has gotten trigger point injections.  She reports having had a cervical spine MRI.  She reports episodes where she will wake up in the middle the night being shaky and sweaty.  She will eat something and this goes away.  She discussed this with her bariatric doctor and they felt it was likely due to hypoglycemia.  In the past she has had this happen during the day.  Over the last month this occurred 3 times at night.  She has not checked her sugar.  She usually eats dinner and a snack at night.  The days it has occurred she cannot identify any specific cause.  She has had no fevers.  Social History   Tobacco Use  Smoking Status Never Smoker  Smokeless Tobacco Never Used     ROS see history of present illness  Objective  Physical Exam Vitals:   07/17/17 1034  BP: 124/76  Pulse: 61  Temp: 98.3 F (36.8 C)  SpO2: 99%    BP Readings from Last 3 Encounters:  07/17/17 124/76  05/17/17 98/60  03/12/17 110/78   Wt Readings from Last 3 Encounters:   07/17/17 250 lb 12.8 oz (113.8 kg)  05/17/17 253 lb 3.2 oz (114.9 kg)  03/12/17 251 lb (113.9 kg)    Physical Exam  Constitutional: No distress.  Cardiovascular: Normal rate, regular rhythm and normal heart sounds.  Pulmonary/Chest: Effort normal and breath sounds normal.  Musculoskeletal: She exhibits no edema.  No midline spine tenderness, no midline spine step-off, there is muscular tenderness just medial to the right scapula, no overlying skin changes, no spasm  Neurological: She is alert.  5/5 strength in bilateral biceps, triceps, grip, quads, hamstrings, plantar and dorsiflexion, slight decreased sensation to light touch along outer portion of right upper arm, otherwise sensation to light touch intact in bilateral UE and LE  Skin: Skin is warm and dry. She is not diaphoretic.     Assessment/Plan: Please see individual problem list.  Hemorrhoids She will keep her appointment with GI as planned.  Anusol refilled.  Right cervical radiculopathy Symptoms are most consistent with cervical radiculopathy.  Recent attempts at treatment with trigger point injections and medications have not been beneficial.  She would like a second opinion.  Referral placed to orthopedic spine surgery Dr. Lynann Bologna.  Depression, major, single episode, mild (HCC) Mildly improved.  I wonder if the Wellbutrin capsule would be more beneficial.  I am not able to locate 1 of those in the system and thus I will ask our  clinical pharmacist.  I did discuss the potential for just increasing the dose versus changing the medication.  Hypoglycemic disorder It really sounds like she is having hypoglycemic episodes in the middle of the night.  This could be related to her bariatric surgery status or inadequate oral intake.  We will check a glucose today.  We will give her a prescription for a glucometer so that she can check when this occurs again.  She will let us know what the blood sugar is.  I did discuss the most  important thing when she feels this way as to eat.   Orders Placed This Encounter  Procedures  . Basic Metabolic Panel (BMET)  . Ambulatory referral to Orthopedic Surgery    Referral Priority:   Routine    Referral Type:   Surgical    Referral Reason:   Specialty Services Required    Requested Specialty:   Orthopedic Surgery    Number of Visits Requested:   1    Meds ordered this encounter  Medications  . blood glucose meter kit and supplies KIT    Sig: Dispense based on patient and insurance preference. Check once daily as needed for hypoglycemic symptoms. Dx hypoglycemic symptoms.    Dispense:  1 each    Refill:  0    Order Specific Question:   Number of strips    Answer:   100    Order Specific Question:   Number of lancets    Answer:   100  . ALPRAZolam (XANAX) 1 MG tablet    Sig: Take 1 tablet (1 mg total) by mouth daily as needed for anxiety.    Dispense:  30 tablet    Refill:  0  . hydrocortisone (ANUSOL-HC) 2.5 % rectal cream    Sig: Place 1 application rectally 2 (two) times daily.    Dispense:  30 g    Refill:  0  . nystatin-triamcinolone ointment (MYCOLOG)    Sig: Apply 1 application topically 2 (two) times daily.    Dispense:  30 g    Refill:  0     [name], MD [place]

## 2017-07-17 NOTE — Patient Instructions (Signed)
Nice to see you. We will check your blood glucose today. I need to check with our clinical pharmacist regarding Wellbutrin capsules. The next time you have an episode at night please try to check your blood sugar.  Please let us know what it is when you do this. We will refer you to orthopedic surgery to discuss your shoulder discomfort. Please make sure you keep the appointment with GI.

## 2017-07-17 NOTE — Assessment & Plan Note (Signed)
She will keep her appointment with GI as planned.  Anusol refilled.

## 2017-07-17 NOTE — Assessment & Plan Note (Signed)
Symptoms are most consistent with cervical radiculopathy.  Recent attempts at treatment with trigger point injections and medications have not been beneficial.  She would like a second opinion.  Referral placed to orthopedic spine surgery Dr. Lynann Bologna.

## 2017-07-17 NOTE — Assessment & Plan Note (Signed)
Mildly improved.  I wonder if the Wellbutrin capsule would be more beneficial.  I am not able to locate 1 of those in the system and thus I will ask our clinical pharmacist.  I did discuss the potential for just increasing the dose versus changing the medication.

## 2017-07-17 NOTE — Telephone Encounter (Signed)
-----   Message from Carlean Jews, [facility] sent at 07/17/2017  4:28 PM EDT ----- Jill Hawkins, unfortunately, only tablets are available. I looked into the option for compounding into a liquid but it appears that the drug wouldn't be stable in solution or suspension so it would expire at very rapid and unpredictable rate. Can consider giving her IR tablets, dosed TID? 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 Looks like bupropion IR's AUC was ~54% of the pre-surgery mean when measured 1 year out from surgery. 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    All this to say that exposure is decreased with oral tablets but it's not the MOST severe reduction seen. I'd try her on the IR version if she's willing to take it TID.  Citation: Basic Clin Pharmacol Toxicol. 2019 Mar 27. doi: 10.1111/bcpt.13234.  ----- Message ----- From: Leone Haven, MD Sent: 07/17/2017  11:12 AM To: Carlean Jews, [place],   This patient is on wellbutrin and has had bariatric surgery. She wanted to know if there was a wellbutrin capsule as she tends to absorb capsules better. I could not find a wellbutrin capsule but wanted to check with you. Thanks.   Randall Hiss

## 2017-07-18 MED ORDER — BUPROPION HCL 100 MG PO TABS: ORAL_TABLET | ORAL | 2 refills | Status: DC

## 2017-07-18 NOTE — Addendum Note (Signed)
Addended by: Leone Haven on: 07/18/2017 04:31 PM   Modules accepted: Orders

## 2017-07-18 NOTE — Telephone Encounter (Signed)
Patient notified and would like to try the immediate release. Please send to [facility]

## 2017-07-18 NOTE — Telephone Encounter (Signed)
Sent to pharmacy 

## 2017-07-24 ENCOUNTER — Telehealth: Payer: Self-pay | Admitting: Family Medicine

## 2017-07-24 NOTE — Telephone Encounter (Signed)
Copied from [place] [id number]. Topic: Quick Communication - See Telephone Encounter >> Jul 24, 2017 12:37 PM Aurelio Brash B wrote: CRM for notification. See Telephone encounter for: 07/24/17.  Pt is asking for lab results

## 2017-07-24 NOTE — Telephone Encounter (Signed)
Patient called and notified of lab results and recommendations. Patient verbalized understanding.

## 2017-09-06 ENCOUNTER — Encounter: Payer: Self-pay | Admitting: Family Medicine

## 2017-09-06 NOTE — Telephone Encounter (Signed)
Please print and fill out the information based on the prior document that she attached. I can sign tomorrow.

## 2017-10-24 ENCOUNTER — Ambulatory Visit: Payer: Managed Care, Other (non HMO) | Admitting: Family Medicine

## 2017-12-03 DIAGNOSIS — M542 Cervicalgia: Secondary | ICD-10-CM | POA: Insufficient documentation

## 2017-12-03 DIAGNOSIS — Z79891 Long term (current) use of opiate analgesic: Secondary | ICD-10-CM | POA: Insufficient documentation

## 2018-02-05 ENCOUNTER — Encounter: Payer: Self-pay | Admitting: Family Medicine

## 2018-02-06 ENCOUNTER — Other Ambulatory Visit: Payer: Self-pay

## 2018-02-07 MED ORDER — MONTELUKAST SODIUM 10 MG PO TABS: 10.0000 mg | ORAL_TABLET | Freq: Every day | ORAL | 3 refills | Status: DC

## 2018-02-07 MED ORDER — FOLIC ACID 1 MG PO TABS: 1.0000 mg | ORAL_TABLET | Freq: Every day | ORAL | 1 refills | Status: DC

## 2018-06-17 ENCOUNTER — Encounter: Payer: Self-pay | Admitting: Family Medicine

## 2018-06-20 ENCOUNTER — Ambulatory Visit (INDEPENDENT_AMBULATORY_CARE_PROVIDER_SITE_OTHER): Payer: Managed Care, Other (non HMO) | Admitting: Family Medicine

## 2018-06-20 ENCOUNTER — Other Ambulatory Visit: Payer: Self-pay

## 2018-06-20 ENCOUNTER — Encounter: Payer: Self-pay | Admitting: Family Medicine

## 2018-06-20 DIAGNOSIS — R109 Unspecified abdominal pain: Secondary | ICD-10-CM | POA: Diagnosis not present

## 2018-06-20 DIAGNOSIS — J019 Acute sinusitis, unspecified: Secondary | ICD-10-CM

## 2018-06-20 MED ORDER — SULFAMETHOXAZOLE-TRIMETHOPRIM 800-160 MG PO TABS: 1.0000 | ORAL_TABLET | Freq: Two times a day (BID) | ORAL | 0 refills | Status: DC

## 2018-06-20 NOTE — Progress Notes (Signed)
Patient ID: Jill Hawkins, female   DOB: 1975-07-06, 43 y.o.   MRN: [id number] Virtual Visit via video Note  This visit type was conducted due to national recommendations for restrictions regarding the COVID-19 pandemic (e.g. social distancing).  This format is felt to be most appropriate for this patient at this time.  All issues noted in this document were discussed and addressed.  No physical exam was performed (except for noted visual exam findings with Video Visits).   I connected with Psychologist, forensic on 06/20/18 at  1:00 PM EDT by a video enabled telemedicine application or telephone and verified that I am speaking with the correct person using two identifiers. Location patient: home Location provider: [facility] Persons participating in the virtual visit: patient, provider  I discussed the limitations, risks, security and privacy concerns of performing an evaluation and management service by video and the availability of in person appointments. I also discussed with the patient that there may be a patient responsible charge related to this service. The patient expressed understanding and agreed to proceed.  HPI: Patient and I connected via video to discuss congestion and sinus symptoms and possibility of UTI.  Patient states she has trouble with allergies for many years.  Currently uses Zyrtec in the a.m. and Singulair in the p.m., has used this combination for years.  States over the past week or so she has noted a lot of pressure across her forehead and above teeth, and thicker sinus drainage that is white or yellow in color.  Patient also states it feels at times as if her nose is burning.  Patient has chronic low back pain, but states over the past couple of days she has noticed pain a little bit higher up in the flank area.  Also notes that her urine has been a little bit darker and stronger odor the past 1 to 2 days.  She is wondering if she could potentially have UTI.  Denies  cough, wheezing or shortness of breath.  Denies chest pain.  Denies vomiting or diarrhea.    ROS: See pertinent positives and negatives per HPI.  Past Medical History:  Diagnosis Date  . Frequent headaches   . GERD (gastroesophageal reflux disease)   . History of fainting spells of unknown cause   . Hypertension   . Kidney stones   . Migraine   . UTI (lower urinary tract infection)     Past Surgical History:  Procedure Laterality Date  . CARPAL TUNNEL RELEASE  2005  . CHOLECYSTECTOMY  2015  . 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 GROWTH PLATE SURGERY  6767   Right hip    Family History  Problem Relation Age of Onset  . Arthritis Mother   . Hypertension Mother   . Mental illness Mother   . Diabetes Mother   . Alcohol abuse Father   . Cancer Father   . Hypertension Father   . Mental illness Brother   . Heart disease Maternal Grandmother   . Hypertension Maternal Grandmother   . Mental illness Maternal Grandmother   . Diabetes Maternal Grandmother   . Cancer Maternal Grandfather   . Hypertension Maternal Grandfather   . Mental illness Paternal Grandmother    Social History   Tobacco Use  . Smoking status: Never Smoker  . Smokeless tobacco: Never Used  Substance Use Topics  . Alcohol use: No    Alcohol/week: 0.0 standard drinks     Current Outpatient Medications:  .  ALPRAZolam Duanne Moron)  1 MG tablet, Take 1 tablet (1 mg total) by mouth daily as needed for anxiety., Disp: 30 tablet, Rfl: 0 .  B Complex Vitamins (VITAMIN-B COMPLEX) TABS, Take by mouth., Disp: , Rfl:  .  blood glucose meter kit and supplies KIT, Dispense based on patient and insurance preference. Check once daily as needed for hypoglycemic symptoms. Dx hypoglycemic symptoms., Disp: 1 each, Rfl: 0 .  buPROPion (WELLBUTRIN) 100 MG tablet, Take 100 mg (1 tablet) by mouth twice daily for 3 days, then increase to 100 mg (1 tablet) by mouth 3 times daily, Disp: 90 tablet, Rfl: 2 .  Cetirizine HCl 10 MG CAPS, Take by  mouth., Disp: , Rfl:  .  cyanocobalamin (,VITAMIN B-12,) 1000 MCG/ML injection, Inject 1,000 mcg into the muscle. , Disp: , Rfl:  .  cyclobenzaprine (FLEXERIL) 10 MG tablet, Take 10 mg by mouth 3 (three) times daily as needed for muscle spasms., Disp: , Rfl:  .  fluticasone (FLONASE) 50 MCG/ACT nasal spray, Place 2 sprays into both nostrils daily., Disp: 16 g, Rfl: 6 .  folic acid (FOLVITE) 1 MG tablet, Take 1 tablet (1 mg total) by mouth daily., Disp: 90 tablet, Rfl: 1 .  hydrocortisone (ANUSOL-HC) 2.5 % rectal cream, Place 1 application rectally 2 (two) times daily., Disp: 30 g, Rfl: 0 .  levonorgestrel (LILETTA) 18.6 MCG/DAY IUD IUD, by Intrauterine route., Disp: , Rfl:  .  methocarbamol (ROBAXIN) 750 MG tablet, Take 750 mg by mouth 3 (three) times daily. , Disp: , Rfl:  .  montelukast (SINGULAIR) 10 MG tablet, Take 1 tablet (10 mg total) by mouth at bedtime., Disp: 30 tablet, Rfl: 3 .  morphine 10 MG/5ML solution, , Disp: , Rfl:  .  Multiple Vitamin (MULTI-VITAMINS) TABS, Take by mouth., Disp: , Rfl:  .  nystatin-triamcinolone ointment (MYCOLOG), Apply 1 application topically 2 (two) times daily., Disp: 30 g, Rfl: 0 .  ondansetron (ZOFRAN-ODT) 4 MG disintegrating tablet, , Disp: , Rfl:  .  pregabalin (LYRICA) 75 MG capsule, Take 75 mg by mouth 3 (three) times daily., Disp: , Rfl:   EXAM:  GENERAL: alert, oriented, appears well and in no acute distress  HEENT: atraumatic, conjunttiva clear, no obvious abnormalities on inspection of external nose and ears  NECK: normal movements of the head and neck  LUNGS: on inspection no signs of respiratory distress, breathing rate appears normal, no obvious gross SOB, gasping or wheezing  CV: no obvious cyanosis  MS: moves all visible extremities without noticeable abnormality  PSYCH/NEURO: pleasant and cooperative, no obvious depression or anxiety, speech and thought processing grossly intact  ASSESSMENT AND PLAN:  Discussed the following  assessment and plan:  Acute sinusitis, recurrence not specified, unspecified location - Plan: sulfamethoxazole-trimethoprim (BACTRIM DS) 800-160 MG tablet  Flank pain  Patient's symptoms and her sinuses do appear consistent with a sinusitis infection.  Patient will take Bactrim twice daily for 10 days.  Also suggested patient trial changing up her morning allergy medication to either Claritin or Allegra instead of Zyrtec to see if this small change will make a difference in keeping her allergy symptoms under control.  Also discussed doing saline nasal rinses with a Nettie pot to help rinse out sinuses to reduce congestion inflammation.  Patient's flank pain and darker/more odorous urine could potentially be a UTI.  The Bactrim patient will take for sinusitis infection will also provide coverage for possible UTI.  Patient also has been advised to increase water intake, avoid excess sugary and caffeinated  beverages.   I discussed the assessment and treatment plan with the patient. The patient was provided an opportunity to ask questions and all were answered. The patient agreed with the plan and demonstrated an understanding of the instructions.   The patient was advised to call back or seek an in-person evaluation if the symptoms worsen or if the condition fails to improve as anticipated.  If urinary symptoms do persist after treatment, patient has been advised we will ask her to bring in urine sample at that time.   Jodelle Green, FNP

## 2018-06-20 NOTE — Telephone Encounter (Signed)
Given to CMA .

## 2018-06-21 ENCOUNTER — Telehealth: Payer: Self-pay

## 2018-06-21 DIAGNOSIS — J309 Allergic rhinitis, unspecified: Secondary | ICD-10-CM

## 2018-06-21 MED ORDER — FEXOFENADINE HCL 180 MG PO TABS: 180.0000 mg | ORAL_TABLET | Freq: Every day | ORAL | 1 refills | Status: DC

## 2018-06-21 NOTE — Telephone Encounter (Signed)
Pt stated that allergra was supposed to be sent in yesterday and it wasn't didn't know if you wanted this sent in or suggested OTC?

## 2018-06-21 NOTE — Telephone Encounter (Signed)
Called Pt No answer, left a VM stating the Rx was sent to the pharmacy, and to call the office if she had any questions.

## 2018-06-21 NOTE — Addendum Note (Signed)
Addended by: Philis Nettle on: 06/21/2018 02:38 PM   Modules accepted: Orders

## 2018-06-21 NOTE — Telephone Encounter (Signed)
Allegra sent into pharmacy.

## 2018-07-24 ENCOUNTER — Encounter: Payer: Self-pay | Admitting: Family Medicine

## 2018-09-09 ENCOUNTER — Encounter: Payer: Self-pay | Admitting: Family Medicine

## 2018-09-10 ENCOUNTER — Other Ambulatory Visit: Payer: Self-pay

## 2018-09-10 ENCOUNTER — Ambulatory Visit: Payer: Managed Care, Other (non HMO) | Admitting: Family Medicine

## 2018-10-09 DIAGNOSIS — S46911A Strain of unspecified muscle, fascia and tendon at shoulder and upper arm level, right arm, initial encounter: Secondary | ICD-10-CM | POA: Insufficient documentation

## 2018-11-06 ENCOUNTER — Encounter: Payer: Self-pay | Admitting: Family Medicine

## 2018-11-07 MED ORDER — BUPROPION HCL 100 MG PO TABS: 100.0000 mg | ORAL_TABLET | Freq: Three times a day (TID) | ORAL | 0 refills | Status: DC

## 2018-11-28 ENCOUNTER — Other Ambulatory Visit: Payer: Self-pay

## 2018-11-29 ENCOUNTER — Encounter: Payer: Self-pay | Admitting: Family Medicine

## 2018-11-29 ENCOUNTER — Ambulatory Visit (INDEPENDENT_AMBULATORY_CARE_PROVIDER_SITE_OTHER): Payer: Managed Care, Other (non HMO) | Admitting: Family Medicine

## 2018-11-29 ENCOUNTER — Other Ambulatory Visit: Payer: Self-pay

## 2018-11-29 VITALS — BP 120/70 | HR 72 | Temp 98.5°F | Ht 67.0 in | Wt 247.6 lb

## 2018-11-29 DIAGNOSIS — K219 Gastro-esophageal reflux disease without esophagitis: Secondary | ICD-10-CM

## 2018-11-29 DIAGNOSIS — Z23 Encounter for immunization: Secondary | ICD-10-CM | POA: Diagnosis not present

## 2018-11-29 DIAGNOSIS — F32A Depression, unspecified: Secondary | ICD-10-CM

## 2018-11-29 DIAGNOSIS — F419 Anxiety disorder, unspecified: Secondary | ICD-10-CM | POA: Diagnosis not present

## 2018-11-29 DIAGNOSIS — L989 Disorder of the skin and subcutaneous tissue, unspecified: Secondary | ICD-10-CM | POA: Diagnosis not present

## 2018-11-29 DIAGNOSIS — R229 Localized swelling, mass and lump, unspecified: Secondary | ICD-10-CM

## 2018-11-29 DIAGNOSIS — F329 Major depressive disorder, single episode, unspecified: Secondary | ICD-10-CM

## 2018-11-29 DIAGNOSIS — Z1239 Encounter for other screening for malignant neoplasm of breast: Secondary | ICD-10-CM

## 2018-11-29 MED ORDER — ESCITALOPRAM OXALATE 10 MG PO TABS: 10.0000 mg | ORAL_TABLET | Freq: Every day | ORAL | 3 refills | Status: DC

## 2018-11-29 NOTE — Patient Instructions (Addendum)
Nice to see you. Please start on 1000 international units daily of vitamin D. We will start you on Lexapro to see if that will help with the anxiety and depression.  We will follow-up in about 2 months for that. Please have your mammogram as planned. Please follow-up with your bariatric surgeon regarding the reflux and the discomfort you have been having.  You likely need an EGD.

## 2018-11-30 DIAGNOSIS — K219 Gastro-esophageal reflux disease without esophagitis: Secondary | ICD-10-CM | POA: Insufficient documentation

## 2018-11-30 DIAGNOSIS — R229 Localized swelling, mass and lump, unspecified: Secondary | ICD-10-CM | POA: Insufficient documentation

## 2018-11-30 DIAGNOSIS — L989 Disorder of the skin and subcutaneous tissue, unspecified: Secondary | ICD-10-CM | POA: Insufficient documentation

## 2018-11-30 DIAGNOSIS — Z1239 Encounter for other screening for malignant neoplasm of breast: Secondary | ICD-10-CM | POA: Insufficient documentation

## 2018-11-30 NOTE — Assessment & Plan Note (Signed)
Patient is having symptoms related to this.  I suspect this is also contributing to her decreased libido.  We will add Lexapro to her Wellbutrin.

## 2018-11-30 NOTE — Assessment & Plan Note (Signed)
Breast exam performed.  Benign findings.  She has a mammogram scheduled for later this month.

## 2018-11-30 NOTE — Assessment & Plan Note (Addendum)
I suspect the symptoms she is having are related to GERD though given the potential mild obstruction seen on barium swallow I did discuss she needs to see her bariatric surgeon to consider EGD.  She will contact them to follow-up on this.  Symptoms are not consistent with a cardiac cause.

## 2018-11-30 NOTE — Assessment & Plan Note (Signed)
Undetermined cause.  This is not consistent with a ganglion cyst.  Discussed monitoring and will have her return in several weeks for physical exam.  If not improving by then we will have her see sports medicine.
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
## 2018-11-30 NOTE — Assessment & Plan Note (Signed)
Refer to dermatology 

## 2018-12-19 ENCOUNTER — Other Ambulatory Visit: Payer: Self-pay

## 2018-12-23 ENCOUNTER — Telehealth: Payer: Self-pay

## 2018-12-23 ENCOUNTER — Encounter: Payer: Managed Care, Other (non HMO) | Admitting: Family Medicine

## 2018-12-23 NOTE — Telephone Encounter (Signed)
Copied from [place] [phone]. Topic: Quick Communication - Appointment Cancellation >> Dec 23, 2018  1:00 PM Lennox Solders wrote: Patient called to cancel appointment scheduled for [place]  Patient HAS NOT rescheduled their appointment. Pt has slight fever  Route to department's PEC pool.

## 2019-01-29 ENCOUNTER — Other Ambulatory Visit: Payer: Self-pay | Admitting: Family Medicine

## 2019-01-29 ENCOUNTER — Encounter: Payer: Self-pay | Admitting: Family Medicine

## 2019-01-30 NOTE — Telephone Encounter (Signed)
Noted. Patient has been corresponding through [employer].

## 2019-03-07 ENCOUNTER — Other Ambulatory Visit: Payer: Self-pay | Admitting: Family Medicine

## 2019-05-16 ENCOUNTER — Telehealth: Payer: Self-pay | Admitting: Family Medicine
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
## 2019-05-16 NOTE — Telephone Encounter (Signed)
The front said it was changed because at first they were told you were returning on the 29th of this month I called and left a VM for the patient to call back to schedule a virtual visit. Mizani Dilday,cma

## 2019-05-16 NOTE — Telephone Encounter (Signed)
Pt's physical was moved from 05/20/19 to 06/25/19. She wanted to discuss with Dr. Caryl Bis maybe adjusting her medication for fibromyalgia flare ups? She said she is in pain all the time in her neck, shoulders, right hip and it is occurring more frequently than before. She said she might be able to do a virtual visit until her appt on 06/25/19 depending on the coverage at work. Please advise. Helem Reesor,cma

## 2019-05-16 NOTE — Telephone Encounter (Signed)
They were told incorrectly. I am likely going to be returning on 3/22. Please see if we can get the patient rescheduled for her physical.

## 2019-05-16 NOTE — Telephone Encounter (Signed)
Pt's physical was moved from 05/20/19 to 06/25/19. She wanted to discuss with Dr. Caryl Bis maybe adjusting her medication for fibromyalgia flare ups? She said she is in pain all the time in her neck, shoulders, right hip and it is occurring more frequently than before. She said she might be able to do a virtual visit until her appt on 06/25/19 depending on the coverage at work. Please advise.

## 2019-05-17 NOTE — Telephone Encounter (Signed)
I did, I called her and put her back on the schedule for 05/20/2019 for her physical and she is aware.  Chihiro Frey,cma

## 2019-05-20 ENCOUNTER — Encounter: Payer: Managed Care, Other (non HMO) | Admitting: Family Medicine

## 2019-06-05 ENCOUNTER — Encounter: Payer: Self-pay | Admitting: Family Medicine

## 2019-06-05 ENCOUNTER — Ambulatory Visit (INDEPENDENT_AMBULATORY_CARE_PROVIDER_SITE_OTHER): Payer: Managed Care, Other (non HMO) | Admitting: Family Medicine

## 2019-06-05 ENCOUNTER — Other Ambulatory Visit: Payer: Self-pay

## 2019-06-05 ENCOUNTER — Ambulatory Visit (INDEPENDENT_AMBULATORY_CARE_PROVIDER_SITE_OTHER): Payer: Managed Care, Other (non HMO)

## 2019-06-05 VITALS — BP 120/70 | HR 59 | Temp 96.7°F | Ht 67.0 in | Wt 253.0 lb

## 2019-06-05 DIAGNOSIS — F419 Anxiety disorder, unspecified: Secondary | ICD-10-CM | POA: Diagnosis not present

## 2019-06-05 DIAGNOSIS — F329 Major depressive disorder, single episode, unspecified: Secondary | ICD-10-CM

## 2019-06-05 DIAGNOSIS — M8580 Other specified disorders of bone density and structure, unspecified site: Secondary | ICD-10-CM | POA: Insufficient documentation

## 2019-06-05 DIAGNOSIS — R6882 Decreased libido: Secondary | ICD-10-CM | POA: Insufficient documentation

## 2019-06-05 DIAGNOSIS — M25551 Pain in right hip: Secondary | ICD-10-CM

## 2019-06-05 DIAGNOSIS — M2550 Pain in unspecified joint: Secondary | ICD-10-CM

## 2019-06-05 DIAGNOSIS — K219 Gastro-esophageal reflux disease without esophagitis: Secondary | ICD-10-CM

## 2019-06-05 DIAGNOSIS — E669 Obesity, unspecified: Secondary | ICD-10-CM | POA: Diagnosis not present

## 2019-06-05 DIAGNOSIS — F32A Depression, unspecified: Secondary | ICD-10-CM

## 2019-06-05 LAB — SEDIMENTATION RATE: Sed Rate: 25 mm/hr — ABNORMAL HIGH (ref 0–20)

## 2019-06-05 LAB — LIPID PANEL
Cholesterol: 121 mg/dL (ref 0–200)
HDL: 66 mg/dL (ref >39.00)
LDL Cholesterol: 47 mg/dL (ref 0–99)
NonHDL: 54.88
Total CHOL/HDL Ratio: 2
Triglycerides: 41 mg/dL (ref 0.0–149.0)
VLDL: 8.2 mg/dL (ref 0.0–40.0)

## 2019-06-05 LAB — COMPREHENSIVE METABOLIC PANEL
ALT: 17 U/L (ref 0–35)
AST: 21 U/L (ref 0–37)
Albumin: 3.8 g/dL (ref 3.5–5.2)
Alkaline Phosphatase: 72 U/L (ref 39–117)
BUN: 9 mg/dL (ref 6–23)
CO2: 27 mEq/L (ref 19–32)
Calcium: 8.9 mg/dL (ref 8.4–10.5)
Chloride: 107 mEq/L (ref 96–112)
Creatinine, Ser: 0.5 mg/dL (ref 0.40–1.20)
GFR: 135.27 mL/min (ref >60.00)
Glucose, Bld: 80 mg/dL (ref 70–99)
Potassium: 3.6 mEq/L (ref 3.5–5.1)
Sodium: 140 mEq/L (ref 135–145)
Total Bilirubin: 0.7 mg/dL (ref 0.2–1.2)
Total Protein: 7 g/dL (ref 6.0–8.3)

## 2019-06-05 LAB — VITAMIN D 25 HYDROXY (VIT D DEFICIENCY, FRACTURES): VITD: 22.75 ng/mL — ABNORMAL LOW (ref 30.00–100.00)

## 2019-06-05 LAB — HEMOGLOBIN A1C: Hgb A1c MFr Bld: 4.2 % — ABNORMAL LOW (ref 4.6–6.5)

## 2019-06-05 LAB — FOLLICLE STIMULATING HORMONE: FSH: 4.7 m[IU]/mL

## 2019-06-05 NOTE — Assessment & Plan Note (Signed)
X-ray to be completed.  Given scattered arthralgias we will check labs as outlined below as well.

## 2019-06-05 NOTE — Progress Notes (Deleted)
[name], MD Phone: [phone]  Jill Hawkins is a 44 y.o. female who presents today for CPE.  Diet: *** Exercise: *** Pap smear: *** Mammogram: *** Menses: *** Sexually active: *** Vaccines-   Flu: ***  Tetanus: ***  COVID19: *** HIV screening: *** Hep C Screening: *** Tobacco use: *** Alcohol use: *** Illicit Drug use: *** Dentist: *** Ophthalmology: ***   Active Ambulatory Problems    Diagnosis Date Noted  . Right cervical radiculopathy 04/25/2014  . Left knee pain 04/25/2014  . Back pain 06/22/2014  . Urinary frequency 06/22/2014  . Encounter for general adult medical examination with abnormal findings 06/27/2015  . Left lower quadrant pain 11/18/2015  . 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  . Allergic rhinitis 06/20/2016  . Bradycardia 06/20/2016  . Right shoulder pain 06/20/2016  . Candidal intertrigo 06/20/2016  . Hemorrhoids 06/20/2016  . Soft tissue mass 06/20/2016  . Left leg swelling 11/17/2016  . Anxiety and depression 05/17/2017  . Hypoglycemic disorder 07/17/2017  . GERD (gastroesophageal reflux disease) 11/30/2018  . Skin lesion 11/30/2018  . Wrist nodule 11/30/2018  . Breast cancer screening 11/30/2018   Resolved Ambulatory Problems    Diagnosis Date Noted  . Sinusitis, acute maxillary 03/24/2014  . Encounter for routine gynecological examination 04/25/2014  . Preop examination 12/20/2015  . Viral upper respiratory infection 12/20/2015  . Respiratory infection 03/20/2016  . Depression 06/20/2016  . Sinusitis 11/17/2016   Past Medical History:  Diagnosis Date  . Frequent headaches   . History of fainting spells of unknown cause   . Hypertension   . Kidney stones   . Migraine   . UTI (lower urinary tract infection)     Family History  Problem Relation Age of Onset  . Arthritis Mother   . Hypertension Mother   . Mental illness Mother   . Diabetes Mother   . Alcohol abuse Father   . Cancer Father   .  Hypertension Father   . Mental illness Brother   . Heart disease Maternal Grandmother   . Hypertension Maternal Grandmother   . Mental illness Maternal Grandmother   . Diabetes Maternal Grandmother   . Cancer Maternal Grandfather   . Hypertension Maternal Grandfather   . Mental illness Paternal Grandmother     Social History   Socioeconomic History  . Marital status: Married    Spouse name: Not on file  . Number of children: Not on file  . Years of education: Not on file  . Highest education level: Not on file  Occupational History  . Not on file  Tobacco Use  . Smoking status: Never Smoker  . Smokeless tobacco: Never Used  Substance and Sexual Activity  . Alcohol use: No    Alcohol/week: 0.0 standard drinks  . Drug use: No  . Sexual activity: Yes    Partners: Male    Comment: Husband  Other Topics Concern  . 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 Difficulty of Paying Living Expenses:   Food Insecurity:   . Worried About Charity fundraiser in the Last Year:   . Arboriculturist in the Last Year:   Transportation Needs:   .  Lack of Transportation (Medical):   Marland Kitchen Lack of Transportation (Non-Medical):   Physical Activity:   . Days of Exercise per Week:   . Minutes of Exercise per Session:   Stress:   . Feeling of Stress :   Social Connections:   . Frequency of Communication with Friends and Family:   . Frequency of Social Gatherings with Friends and Family:   . Attends Religious Services:   . Active Member of Clubs or Organizations:   . Attends Archivist Meetings:   Marland Kitchen Marital Status:   Intimate Partner Violence:   . Fear of Current or Ex-Partner:   . Emotionally Abused:   Marland Kitchen Physically Abused:   . Sexually Abused:     ROS   General:  Negative for nexplained weight loss, fever Skin: Negative for new or changing mole, sore that won't heal HEENT: Negative for trouble hearing, trouble seeing, ringing in ears, mouth sores, hoarseness, change in voice, dysphagia. CV:  Negative for chest pain, dyspnea, edema, palpitations Resp: Negative for cough, dyspnea, hemoptysis GI: Negative for nausea, vomiting, diarrhea, constipation, abdominal pain, melena, hematochezia. GU: Negative for dysuria, incontinence, urinary hesitance, hematuria, vaginal or penile discharge, polyuria, sexual difficulty, lumps in testicle or breasts MSK: Negative for muscle cramps or aches, joint pain or swelling Neuro: Negative for headaches, weakness, numbness, dizziness, passing out/fainting Psych: Negative for depression, anxiety, memory problems  Objective  Physical Exam There were no vitals filed for this visit.  BP Readings from Last 3 Encounters:  11/29/18 120/70  07/17/17 124/76  05/17/17 98/60   Wt Readings from Last 3 Encounters:  11/29/18 247 lb 9.6 oz (112.3 kg)  07/17/17 250 lb 12.8 oz (113.8 kg)  05/17/17 253 lb 3.2 oz (114.9 kg)    Physical Exam   Assessment/Plan:   No problem-specific Assessment & Plan notes found for this encounter.   No orders of the defined types were placed in this encounter.   No orders of the defined types were placed in this encounter.   This visit occurred during the SARS-CoV-2 public health emergency.  Safety protocols were in place, including screening questions prior to the visit, additional usage of staff PPE, and extensive cleaning of exam room while observing appropriate contact time as indicated for disinfecting solutions.    [name], MD [place]

## 2019-06-05 NOTE — Assessment & Plan Note (Signed)
Much improved after hernia surgery.  She will monitor.

## 2019-06-05 NOTE — Assessment & Plan Note (Signed)
These have improved.  She will continue Lexapro and Wellbutrin.

## 2019-06-05 NOTE — Patient Instructions (Signed)
Nice to see you.  We will check labs and an x-ray today.

## 2019-06-05 NOTE — Assessment & Plan Note (Signed)
Labs as outlined below. 

## 2019-06-05 NOTE — Assessment & Plan Note (Signed)
Check vitamin D. 

## 2019-06-05 NOTE — Progress Notes (Signed)
[name], MD Phone: [phone]  Jill Hawkins is a 44 y.o. female who presents today for follow-up.  Depression/anxiety: She denies depression and anxiety.  She is taking Lexapro.  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  She notes she discussed it with her gynecologist previously and they advised that she see a sex therapist.  We previously started Lexapro to see if that would help with depression and anxiety and thus improve sex drive though depression and anxiety have improved though sex drive has not.  She has a good relationship with her husband.  She has not had any life changes that would be contributing.  She was previously taking morphine for chronic pain though has not been taking that.  She is on phentermine.  She has not had a menstrual cycle since she had her IUD placed about 10 months ago.  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  She is no longer on omeprazole.  She notes her symptoms have improved significantly with regards to her reflux.  She does occasionally hiccup when she eats.  Joint pain: Patient notes scattered joint pains over the last several months.  Her right hip hurts quite a bit.  Anything can cause it to hurt.  She had a pin placed at age 66 and her growth plate.  No recent injury.  Left ring finger has also been bothering her.  There was a sudden not on the PIP joint though the knot went away but this finger remains a little swollen.  She has scattered pains in her neck and shoulders as well.  Osteopenia: Diagnosed through her orthopedist per patient report.  She has been on vitamin D 50,000 units once weekly as well as calcium 1200 mg once a day.  Notes her orthopedist suggested she have her vitamin D checked.  Social History   Tobacco Use  Smoking Status Never Smoker  Smokeless Tobacco Never Used     ROS see history of present  illness  Objective  Physical Exam Vitals:   06/05/19 0847  BP: 120/70  Pulse: (!) 59  Temp: (!) 96.7 F (35.9 C)  SpO2: 96%    BP Readings from Last 3 Encounters:  06/05/19 120/70  11/29/18 120/70  07/17/17 124/76   Wt Readings from Last 3 Encounters:  06/05/19 253 lb (114.8 kg)  11/29/18 247 lb 9.6 oz (112.3 kg)  07/17/17 250 lb 12.8 oz (113.8 kg)    Physical Exam Constitutional:      General: She is not in acute distress.    Appearance: She is not diaphoretic.  Cardiovascular:     Rate and Rhythm: Normal rate and regular rhythm.     Heart sounds: Normal heart sounds.  Pulmonary:     Effort: Pulmonary effort is normal.     Breath sounds: Normal breath sounds.  Musculoskeletal:     Comments: Slight tenderness over the right lateral hip, there is decreased internal range of motion in the right hip with some discomfort, good external range of motion right hip, left hip with good internal and external range of motion, left ring finger with no joint swelling or tenderness  Skin:    General: Skin is warm and dry.  Neurological:     Mental Status: She is alert.      Assessment/Plan: Please see individual problem list.  GERD (gastroesophageal reflux disease) Much improved after hernia surgery.  She will monitor.  Anxiety and depression These have improved.  She will continue Lexapro and Wellbutrin.  Obesity (BMI 35.0-39.9 without comorbidity) Labs as outlined below.  Decreased sex drive Undetermined cause.  We will have her clinical pharmacist review the patient's medicines to see if there is anything that could be contributing.  We will check an [place].  Right hip pain X-ray to be completed.  Given scattered arthralgias we will check labs as outlined below as well.  Osteopenia Check vitamin D.   Orders Placed This Encounter  Procedures  . DG HIP UNILAT WITH PELVIS 2-3 VIEWS RIGHT    Standing Status:   Future    Number of Occurrences:   1    Standing  Expiration Date:   08/04/2020    Order Specific Question:   Reason for Exam (SYMPTOM  OR DIAGNOSIS REQUIRED)    Answer:   right hip pain, no injury    Order Specific Question:   Is patient pregnant?    Answer:   No    Order Specific Question:   Preferred imaging location?    Answer:   [facility] Specific Question:   Radiology Contrast Protocol - do NOT remove file path    Answer:   [email or web address].[email or web address]  . Comp Met (CMET)  . Lipid panel  . HgB A1c  . Vitamin D (25 hydroxy)  . [place]  . Rheumatoid Factor  . Cyclic citrul peptide antibody, IgG (QUEST)  . Antinuclear Antib (ANA)  . Sedimentation rate    No orders of the defined types were placed in this encounter.   This visit occurred during the SARS-CoV-2 public health emergency.  Safety protocols were in place, including screening questions prior to the visit, additional usage of staff PPE, and extensive cleaning of exam room while observing appropriate contact time as indicated for disinfecting solutions.    [name], MD [place]

## 2019-06-05 NOTE — Assessment & Plan Note (Signed)
Undetermined cause.  We will have her clinical pharmacist review the patient's medicines to see if there is anything that could be contributing.  We will check an [place].

## 2019-06-06 LAB — ANA: Anti Nuclear Antibody (ANA): NEGATIVE

## 2019-06-06 LAB — RHEUMATOID FACTOR: Rheumatoid fact SerPl-aCnc: <14 IU/mL (ref <14)

## 2019-06-06 LAB — CYCLIC CITRUL PEPTIDE ANTIBODY, IGG: Cyclic Citrullin Peptide Ab: 23 UNITS — ABNORMAL HIGH

## 2019-06-12 ENCOUNTER — Other Ambulatory Visit: Payer: Self-pay | Admitting: Family Medicine

## 2019-06-12 ENCOUNTER — Telehealth: Payer: Self-pay | Admitting: Family Medicine

## 2019-06-12 DIAGNOSIS — R768 Other specified abnormal immunological findings in serum: Secondary | ICD-10-CM

## 2019-06-12 NOTE — Telephone Encounter (Signed)
-----   Message from De Hollingshead, [facility] [employer] sent at 06/12/2019 12:16 PM EDT ----- Meds that have a risk of decreased libido include: escitalopram, pregabalin, phentermine, with escitalopram being the biggest risk.   Alternative antidepressants with less of a risk include Trintellix, which does have some data in reduction in sexual side effects when patients were switched from SSRI. Apparently, there's also data suggesting that fluoxetine may have a less risk of sexual side effects than escitalopram. ----- Message ----- From: Leone Haven, MD Sent: 06/12/2019  10:07 AM EDT To: De Hollingshead, [place], Gordy Councilman, CMA  Referral placed. You can fax the results to her bariatric physician. I will forward to Catie to see if she can review the medications to see if anything would cause decreased sex drive.

## 2019-06-12 NOTE — Telephone Encounter (Signed)
Please pass the medications listed below on to the patient as possible causes of her decreased sex drive. I believe she had the decreased libido prior to starting the lexapro, so I do not think that is the cause. We could consider the other two as possible causes and consider bringing her off of those if she is ok with that.

## 2019-06-13 NOTE — Telephone Encounter (Signed)
She should discuss coming off the phentermine with them. If she is willing we could try to bring her off the lyrica as well.

## 2019-06-13 NOTE — Telephone Encounter (Signed)
Called and spoke with patient.  She stated she had the libido issue before lexapro as well. She is eeing her bariatric doc on 06/24/2019.  Yeny Schmoll,cma

## 2019-06-16 NOTE — Telephone Encounter (Signed)
Patient wanted to know what you would replace the lyrica with because she has a referal to RA. I informed her that I would get back with her.   [place] Flett,cma

## 2019-06-17 ENCOUNTER — Encounter: Payer: Self-pay | Admitting: Family Medicine

## 2019-06-17 DIAGNOSIS — J309 Allergic rhinitis, unspecified: Secondary | ICD-10-CM

## 2019-06-17 NOTE — Telephone Encounter (Signed)
Called the patient and her mailbox is full.  Jill Hawkins,cma

## 2019-06-17 NOTE — Telephone Encounter (Signed)
Lets have her see rheumatology first to determine if they have an alternative to the lyrica.

## 2019-06-18 ENCOUNTER — Other Ambulatory Visit: Payer: Self-pay

## 2019-06-18 ENCOUNTER — Other Ambulatory Visit: Payer: Self-pay | Admitting: Family Medicine

## 2019-06-18 ENCOUNTER — Encounter: Payer: Self-pay | Admitting: Dermatology

## 2019-06-18 ENCOUNTER — Ambulatory Visit: Payer: Managed Care, Other (non HMO) | Admitting: Dermatology

## 2019-06-18 DIAGNOSIS — B079 Viral wart, unspecified: Secondary | ICD-10-CM | POA: Diagnosis not present

## 2019-06-18 DIAGNOSIS — D485 Neoplasm of uncertain behavior of skin: Secondary | ICD-10-CM | POA: Diagnosis not present

## 2019-06-18 DIAGNOSIS — L309 Dermatitis, unspecified: Secondary | ICD-10-CM | POA: Diagnosis not present

## 2019-06-18 DIAGNOSIS — D489 Neoplasm of uncertain behavior, unspecified: Secondary | ICD-10-CM

## 2019-06-18 DIAGNOSIS — L304 Erythema intertrigo: Secondary | ICD-10-CM

## 2019-06-18 MED ORDER — KETOCONAZOLE 2 % EX CREA: TOPICAL_CREAM | CUTANEOUS | 7 refills | Status: DC

## 2019-06-18 MED ORDER — CLOBETASOL PROPIONATE 0.05 % EX CREA: 1.0000 "application " | TOPICAL_CREAM | Freq: Two times a day (BID) | CUTANEOUS | 1 refills | Status: DC | PRN

## 2019-06-18 MED ORDER — CLOBETASOL PROP EMOLLIENT BASE 0.05 % EX CREA: TOPICAL_CREAM | CUTANEOUS | 0 refills | Status: DC

## 2019-06-18 MED ORDER — MUPIROCIN 2 % EX OINT: 1.0000 "application " | TOPICAL_OINTMENT | Freq: Two times a day (BID) | CUTANEOUS | 3 refills | Status: DC

## 2019-06-18 MED ORDER — HYDROCORTISONE 2.5 % EX CREA: TOPICAL_CREAM | CUTANEOUS | 3 refills | Status: DC

## 2019-06-18 NOTE — Patient Instructions (Addendum)
Wound Care Instructions  1. Cleanse wound gently with soap and water once a day then pat dry with clean gauze. Apply a thing coat of Petrolatum (petroleum jelly, "Vaseline") over the wound (unless you have an allergy to this). We recommend that you use a new, sterile tube of Vaseline. Do not pick or remove scabs. Do not remove the yellow or white "healing tissue" from the base of the wound.  2. Cover the wound with fresh, clean, nonstick gauze and secure with paper tape. You may use Band-Aids in place of gauze and tape if the would is small enough, but would recommend trimming much of the tape off as there is often too much. Sometimes Band-Aids can irritate the skin.  3. You should call the office for your biopsy report after 1 week if you have not already been contacted.  4. If you experience any problems, such as abnormal amounts of bleeding, swelling, significant bruising, significant pain, or evidence of infection, please call the office immediately.  5. FOR ADULT SURGERY PATIENTS: If you need something for pain relief you may take 1 extra strength Tylenol (acetaminophen) AND 2 Ibuprofen (200mg  each) together every 4 hours as needed for pain. (do not take these if you are allergic to them or if you have a reason you should not take them.) Typically, you may only need pain medication for 1 to 3 days.      Liquid nitrogen was applied for 10-12 seconds to the skin lesion and the expected blistering or scabbing reaction explained. Do not pick at the area. Patient reminded to expect hypopigmented scars from the procedure. Return if lesion fails to fully resolve.  Topical steroids (such as triamcinolone, fluocinolone, fluocinonide, mometasone, clobetasol, halobetasol, betamethasone, hydrocortisone) can cause thinning and lightening of the skin if they are used for too long in the same area. Your physician has selected the right strength medicine for your problem and area affected on the body. Please  use your medication only as directed by your physician to prevent side effects.

## 2019-06-18 NOTE — Progress Notes (Signed)
Follow-Up Visit   Subjective  Jill Hawkins is a 44 y.o. female who presents for the following: Follow-up (ISK follow up).  Patient states she is here for follow up for irritated seborrheic keratosis, Cryotherapy was used at last office visit. She does states that she can still see them however, they are not flaky or itchy any longer.  Patient also has 2 spots she would like to have looked at today. R. Post calf, itchy, since last visit, no other issues with this area.   Right foot on the arch, scaly spot, has been there for over a year, rubs on her flip flops and it gets irritated.   The following portions of the chart were reviewed this encounter and updated as appropriate: Tobacco  Allergies  Meds  Problems  Med Hx  Surg Hx  Fam Hx      Review of Systems: No other skin or systemic complaints.  Objective  Well appearing patient in no apparent distress; mood and affect are within normal limits.  A focused examination was performed including hands, low abdomen, lower extremities, including the legs, feet, toes, and toenails. Relevant physical exam findings are noted in the Assessment and Plan.  Objective  Bilateral hands: Scaly erythematous papules and plaques  Objective  Lower abdomen: Macerated pink patch  Objective  Right medial calf: 0.4cm smooth pink papule       Objective  Right Medial Plantar Surface: Verrucous papules  Assessment & Plan  Dermatitis Bilateral hands  Hand Dermatitis  Start Clobetasol cream BID prn for up to 3 weeks #30g 1rRF  Topical steroids (such as triamcinolone, fluocinolone, fluocinonide, mometasone, clobetasol, halobetasol, betamethasone, hydrocortisone) can cause thinning and lightening of the skin if they are used for too long in the same area. Your physician has selected the right strength medicine for your problem and area affected on the body. Please use your medication only as directed by your physician to prevent side  effects.    clobetasol cream (TEMOVATE) 0.05 % - Bilateral hands  Erythema intertrigo Lower abdomen  Chronic for years, not at goal.   Advised not to use triamcinolone (she has it at home) due to risk of atrophy.   Start Ketoconazole cream 2% BID PRN to affected areas #30 7RF Start Hydrocortisone cream 2.5% BID PRN to affected area up to 1 week, can repeat in 1 month if needed #28g 3RF For prevention, recommend OTC antifungal powder  Topical steroids (such as triamcinolone, fluocinolone, fluocinonide, mometasone, clobetasol, halobetasol, betamethasone, hydrocortisone) can cause thinning and lightening of the skin if they are used for too long in the same area. Your physician has selected the right strength medicine for your problem and area affected on the body. Please use your medication only as directed by your physician to prevent side effects.    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 Anesthesia: the lesion was anesthetized in a standard fashion   Anesthetic:  1% lidocaine w/ epinephrine 1-100,000 local infiltration Instrument used: DermaBlade  Outcome: patient tolerated procedure well   Post-procedure details: wound care instructions given   Additional details:  Mupirocin and a bandage applied  Specimen 1 - Surgical pathology Differential Diagnosis: R/O hemangioma vs lichenoid keratosis >> amelanotic melanoma Check Margins: No 0.4cm smooth pink papule  R/O hemangioma vs  lichenoid keratosis >> amelanotic melanoma Start Mupirocin twice daily as needed to a clean wound, then cover with bandage #22g 3RF  Viral warts, unspecified type Right Medial Plantar Surface  Discussed viral etiology and risk of spread.  Discussed multiple treatments may be required to clear warts.  Discussed possible post-treatment dyspigmentation and risk of recurrence.  Bandage applied   Destruction of lesion - Right Medial Plantar Surface  Destruction method: cryotherapy   Informed consent: discussed and consent obtained   Lesion destroyed using liquid nitrogen: Yes   Outcome: patient tolerated procedure well with no complications   Post-procedure details: wound care instructions given    Return 3-4 weeks, for wart, hand dermatitis, intertrigo.   IDonzetta Kohut, CMA, am acting as scribe for Forest Gleason, MD .  Documentation: I have reviewed the above documentation for accuracy and completeness, and I agree with the above.  Forest Gleason, MD

## 2019-06-19 ENCOUNTER — Ambulatory Visit: Payer: Managed Care, Other (non HMO) | Admitting: Dermatology

## 2019-06-20 ENCOUNTER — Other Ambulatory Visit: Payer: Self-pay | Admitting: Family Medicine

## 2019-06-23 MED ORDER — FEXOFENADINE HCL 180 MG PO TABS: 180.0000 mg | ORAL_TABLET | Freq: Every day | ORAL | 1 refills | Status: DC

## 2019-06-23 NOTE — Telephone Encounter (Signed)
Pt would like a refill on fexofenadine (ALLEGRA) 180 MG tablet

## 2019-06-23 NOTE — Addendum Note (Signed)
Addended byElpidio Galea T on: 06/23/2019 04:34 PM   Modules accepted: Orders

## 2019-06-23 NOTE — Telephone Encounter (Signed)
Allegra has been refilled. Unable to refill folic acid.

## 2019-06-25 ENCOUNTER — Encounter: Payer: Managed Care, Other (non HMO) | Admitting: Family Medicine

## 2019-06-25 ENCOUNTER — Telehealth: Payer: Self-pay

## 2019-06-25 NOTE — Telephone Encounter (Signed)
Pt called left message please call here about a wart that Dr Laurence Ferrari treated during her last office visit.    Left message please call here

## 2019-06-25 NOTE — Telephone Encounter (Signed)
Patient called concerned about the wart that was treated with LN2 last week, she wanted to make sure it was normal for it to still be there and have a small blister. She also had questions about her topicals. I advised patient it was normal for the wart to still be there with a blister and that it can take multiple treatments. Advised her she can get over the counter salicylic pads to use at home in between treatments with LN2.

## 2019-07-01 ENCOUNTER — Telehealth: Payer: Self-pay

## 2019-07-01 NOTE — Telephone Encounter (Signed)
Called and notified patient of biopsy results, patient verbalized understanding

## 2019-07-07 NOTE — Telephone Encounter (Signed)
I called and spoke with the patient and she stated she has an upcoming appt with rheumatology.  Jill Hawkins,cma

## 2019-07-10 ENCOUNTER — Ambulatory Visit: Payer: Managed Care, Other (non HMO) | Admitting: Dermatology

## 2019-07-10 ENCOUNTER — Encounter: Payer: Self-pay | Admitting: Family Medicine

## 2019-07-10 DIAGNOSIS — R768 Other specified abnormal immunological findings in serum: Secondary | ICD-10-CM | POA: Insufficient documentation

## 2019-07-11 MED ORDER — DULOXETINE HCL 30 MG PO CPEP: ORAL_CAPSULE | ORAL | 1 refills | Status: DC

## 2019-07-14 ENCOUNTER — Encounter: Payer: Managed Care, Other (non HMO) | Admitting: Family Medicine

## 2019-07-22 ENCOUNTER — Ambulatory Visit: Payer: Managed Care, Other (non HMO) | Admitting: Dermatology

## 2019-08-18 ENCOUNTER — Encounter: Payer: Managed Care, Other (non HMO) | Admitting: Family Medicine

## 2019-08-18 DIAGNOSIS — Z0289 Encounter for other administrative examinations: Secondary | ICD-10-CM

## 2019-08-18 NOTE — Progress Notes (Deleted)
[name], MD Phone: [phone]  Jill Hawkins is a 44 y.o. female who presents today for CPE.  Diet: *** Exercise: *** Pap smear: *** Mammogram: *** Family history-  Colon cancer: ***  Breast cancer: ***  Ovarian cancer: *** Menses: *** Vaccines-   Flu: ***  Tetanus: ***  COVID19: *** HIV screening: *** Hep C Screening: *** Tobacco use: *** Alcohol use: *** Illicit Drug use: *** Dentist: *** Ophthalmology: ***   Active Ambulatory Problems    Diagnosis Date Noted  . Right cervical radiculopathy 04/25/2014  . Left knee pain 04/25/2014  . Back pain 06/22/2014  . Urinary frequency 06/22/2014  . Encounter for general adult medical examination with abnormal findings 06/27/2015  . Left lower quadrant pain 11/18/2015  . 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  . Allergic rhinitis 06/20/2016  . Bradycardia 06/20/2016  . Right shoulder pain 06/20/2016  . Candidal intertrigo 06/20/2016  . Hemorrhoids 06/20/2016  . Soft tissue mass 06/20/2016  . Left leg swelling 11/17/2016  . Anxiety and depression 05/17/2017  . Hypoglycemic disorder 07/17/2017  . GERD (gastroesophageal reflux disease) 11/30/2018  . Skin lesion 11/30/2018  . Wrist nodule 11/30/2018  . Breast cancer screening 11/30/2018  . Right hip pain 06/05/2019  . Decreased sex drive 84/69/6295  . Osteopenia 06/05/2019  . Obesity (BMI 35.0-39.9 without comorbidity) 06/05/2019   Resolved Ambulatory Problems    Diagnosis Date Noted  . Sinusitis, acute maxillary 03/24/2014  . Encounter for routine gynecological examination 04/25/2014  . Preop examination 12/20/2015  . Viral upper respiratory infection 12/20/2015  . Respiratory infection 03/20/2016  . Depression 06/20/2016  . Sinusitis 11/17/2016   Past Medical History:  Diagnosis Date  . Frequent headaches   . History of fainting spells of unknown cause   . Hypertension   . Kidney stones   . Migraine   . UTI (lower urinary tract  infection)     Family History  Problem Relation Age of Onset  . Arthritis Mother   . Hypertension Mother   . Mental illness Mother   . Diabetes Mother   . Alcohol abuse Father   . Cancer Father   . Hypertension Father   . Mental illness Brother   . Heart disease Maternal Grandmother   . Hypertension Maternal Grandmother   . Mental illness Maternal Grandmother   . Diabetes Maternal Grandmother   . Cancer Maternal Grandfather   . Hypertension Maternal Grandfather   . Mental illness Paternal Grandmother     Social History   Socioeconomic History  . Marital status: Married    Spouse name: Not on file  . Number of children: Not on file  . Years of education: Not on file  . Highest education level: Not on file  Occupational History  . Not on file  Tobacco Use  . Smoking status: Never Smoker  . Smokeless tobacco: Never Used  Substance and Sexual Activity  . Alcohol use: No    Alcohol/week: 0.0 standard drinks  . Drug use: No  . Sexual activity: Yes    Partners: Male    Comment: Husband  Other Topics Concern  . 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 Difficulty of Paying  Living Expenses:   Food Insecurity:   . Worried About Charity fundraiser in the Last Year:   . Arboriculturist in the Last Year:   Transportation Needs:   . Film/video editor (Medical):   Marland Kitchen Lack of Transportation (Non-Medical):   Physical Activity:   . Days of Exercise per Week:   . Minutes of Exercise per Session:   Stress:   . Feeling of Stress :   Social Connections:   . Frequency of Communication with Friends and Family:   . Frequency of Social Gatherings with Friends and Family:   . Attends Religious Services:   . Active Member of Clubs or Organizations:   . Attends  Archivist Meetings:   Marland Kitchen Marital Status:   Intimate Partner Violence:   . Fear of Current or Ex-Partner:   . Emotionally Abused:   Marland Kitchen Physically Abused:   . Sexually Abused:     ROS  General:  Negative for nexplained weight loss, fever Skin: Negative for new or changing mole, sore that won't heal HEENT: Negative for trouble hearing, trouble seeing, ringing in ears, mouth sores, hoarseness, change in voice, dysphagia. CV:  Negative for chest pain, dyspnea, edema, palpitations Resp: Negative for cough, dyspnea, hemoptysis GI: Negative for nausea, vomiting, diarrhea, constipation, abdominal pain, melena, hematochezia. GU: Negative for dysuria, incontinence, urinary hesitance, hematuria, vaginal or penile discharge, polyuria, sexual difficulty, lumps in testicle or breasts MSK: Negative for muscle cramps or aches, joint pain or swelling Neuro: Negative for headaches, weakness, numbness, dizziness, passing out/fainting Psych: Negative for depression, anxiety, memory problems  Objective  Physical Exam There were no vitals filed for this visit.  BP Readings from Last 3 Encounters:  06/05/19 120/70  11/29/18 120/70  07/17/17 124/76   Wt Readings from Last 3 Encounters:  06/05/19 253 lb (114.8 kg)  11/29/18 247 lb 9.6 oz (112.3 kg)  07/17/17 250 lb 12.8 oz (113.8 kg)    Physical Exam   Assessment/Plan:   No problem-specific Assessment & Plan notes found for this encounter.   No orders of the defined types were placed in this encounter.   No orders of the defined types were placed in this encounter.   This visit occurred during the SARS-CoV-2 public health emergency.  Safety protocols were in place, including screening questions prior to the visit, additional usage of staff PPE, and extensive cleaning of exam room while observing appropriate contact time as indicated for disinfecting solutions.    [name], MD [place]

## 2019-10-03 ENCOUNTER — Other Ambulatory Visit: Payer: Self-pay | Admitting: Family Medicine

## 2019-11-11 ENCOUNTER — Encounter: Payer: Self-pay | Admitting: Family Medicine

## 2019-11-11 ENCOUNTER — Other Ambulatory Visit: Payer: Self-pay

## 2019-11-11 ENCOUNTER — Ambulatory Visit (INDEPENDENT_AMBULATORY_CARE_PROVIDER_SITE_OTHER): Payer: Managed Care, Other (non HMO) | Admitting: Family Medicine

## 2019-11-11 ENCOUNTER — Other Ambulatory Visit (HOSPITAL_COMMUNITY)
Admission: RE | Admit: 2019-11-11 | Discharge: 2019-11-11 | Disposition: A | Discharge status: Home / Self Care | Payer: Managed Care, Other (non HMO) | Source: Ambulatory Visit | Attending: Family Medicine | Admitting: Family Medicine

## 2019-11-11 VITALS — BP 120/70 | HR 61 | Temp 98.3°F | Ht 67.0 in | Wt 238.2 lb

## 2019-11-11 DIAGNOSIS — E162 Hypoglycemia, unspecified: Secondary | ICD-10-CM

## 2019-11-11 DIAGNOSIS — Z124 Encounter for screening for malignant neoplasm of cervix: Secondary | ICD-10-CM | POA: Diagnosis not present

## 2019-11-11 DIAGNOSIS — Z0001 Encounter for general adult medical examination with abnormal findings: Secondary | ICD-10-CM | POA: Diagnosis not present

## 2019-11-11 DIAGNOSIS — F419 Anxiety disorder, unspecified: Secondary | ICD-10-CM | POA: Diagnosis not present

## 2019-11-11 DIAGNOSIS — F329 Major depressive disorder, single episode, unspecified: Secondary | ICD-10-CM

## 2019-11-11 MED ORDER — DEXCOM G6 RECEIVER DEVI: 0 refills | Status: DC

## 2019-11-11 MED ORDER — DEXCOM G6 SENSOR MISC: 2 refills | Status: DC

## 2019-11-11 MED ORDER — DEXCOM G6 TRANSMITTER MISC: 0 refills | Status: DC

## 2019-11-11 NOTE — Assessment & Plan Note (Signed)
We will order a Dexcom and see if her insurance will cover this for this diagnosis.  I think it would be beneficial for her to have this information to help determine how low her sugar is dropping when she develops symptoms.

## 2019-11-11 NOTE — Progress Notes (Addendum)
[name], MD Phone: [phone]  Jill Hawkins is a 44 y.o. female who presents today for CPE.  Diet: Stable.  Lots of protein.  She has been eating more carbs recently.  Not too many sweets.  No sweet drinks. Exercise: Walks some though this is limited by her rheumatoid arthritis. Pap smear: 05/01/17 - negative Mammogram: She is due in October. Family history-  Colon cancer: No  Breast cancer: No  Ovarian cancer: No Menses: Has an IUD so very rarely has any bleeding Sexually active: Yes only with her husband though she has been having sexual difficulty and her GYN recommended a sex therapist. Vaccines-   Flu: She is going to wait another several weeks  Tetanus: Up-to-date  COVID19: Up-to-date HIV screening: UTD Hep C Screening: Up-to-date Tobacco use: No Alcohol use: At most one a week Illicit Drug use: No Dentist: Yes Ophthalmology: Yes  Patient does report some nausea and vomiting since starting on leflunomide.  She wonders if that is contributing or if it is interacting with her other medications.  Patient also reports occasional episodes that she feels are related to hypoglycemia.  She does not always have a chance to check her sugar when this happens and wonders if she could get a Dexcom for this.  She will be shaky and sweaty and feels as though she is going to pass out when this occurs.  She reports chronic anxiety and depression issues that are stable.  She continues on Cymbalta.  No SI.  Active Ambulatory Problems    Diagnosis Date Noted  . Right cervical radiculopathy 04/25/2014  . Left knee pain 04/25/2014  . Back pain 06/22/2014  . Urinary frequency 06/22/2014  . Encounter for general adult medical examination with abnormal findings 06/27/2015  . Left lower quadrant pain 11/18/2015  . 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  . Allergic rhinitis 06/20/2016  . Bradycardia 06/20/2016  . Right shoulder pain 06/20/2016  . Candidal intertrigo  06/20/2016  . Hemorrhoids 06/20/2016  . Soft tissue mass 06/20/2016  . Left leg swelling 11/17/2016  . Anxiety and depression 05/17/2017  . Hypoglycemic disorder 07/17/2017  . GERD (gastroesophageal reflux disease) 11/30/2018  . Skin lesion 11/30/2018  . Wrist nodule 11/30/2018  . Breast cancer screening 11/30/2018  . Right hip pain 06/05/2019  . Decreased sex drive 68/01/7516  . Osteopenia 06/05/2019  . Obesity (BMI 35.0-39.9 without comorbidity) 06/05/2019   Resolved Ambulatory Problems    Diagnosis Date Noted  . Sinusitis, acute maxillary 03/24/2014  . Encounter for routine gynecological examination 04/25/2014  . Preop examination 12/20/2015  . Viral upper respiratory infection 12/20/2015  . Respiratory infection 03/20/2016  . Depression 06/20/2016  . Sinusitis 11/17/2016   Past Medical History:  Diagnosis Date  . Frequent headaches   . History of fainting spells of unknown cause   . Hypertension   . Kidney stones   . Migraine   . UTI (lower urinary tract infection)     Family History  Problem Relation Age of Onset  . Arthritis Mother   . Hypertension Mother   . Mental illness Mother   . Diabetes Mother   . Alcohol abuse Father   . Cancer Father   . Hypertension Father   . Mental illness Brother   . Heart disease Maternal Grandmother   . Hypertension Maternal Grandmother   . Mental illness Maternal Grandmother   . Diabetes Maternal Grandmother   . Cancer Maternal Grandfather   . Hypertension Maternal Grandfather   .  Mental illness Paternal Grandmother     Social History   Socioeconomic History  . Marital status: Married    Spouse name: Not on file  . Number of children: Not on file  . Years of education: Not on file  . Highest education level: Not on file  Occupational History  . Not on file  Tobacco Use  . Smoking status: Never Smoker  . Smokeless tobacco: Never Used  Substance and Sexual Activity  . Alcohol use: No    Alcohol/week: 0.0  standard drinks  . Drug use: No  . Sexual activity: Yes    Partners: Male    Comment: Husband  Other Topics Concern  . 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 Difficulty of Paying Living Expenses: Not on file  Food Insecurity:   . Worried About Charity fundraiser in the Last Year: Not on file  . Ran Out of Food in the Last Year: Not on file  Transportation Needs:   . Lack of Transportation (Medical): Not on file  . Lack of Transportation (Non-Medical): Not on file  Physical Activity:   . Days of Exercise per Week: Not on file  . Minutes of Exercise per Session: Not on file  Stress:   . Feeling of Stress : Not on file  Social Connections:   . Frequency of Communication with Friends and Family: Not on file  . Frequency of Social Gatherings with Friends and Family: Not on file  . Attends Religious Services: Not on file  . Active Member of Clubs or Organizations: Not on file  . Attends Archivist Meetings: Not on file  . Marital Status: Not on file  Intimate Partner Violence:   . Fear of Current or Ex-Partner: Not on file  . Emotionally Abused: Not on file  . Physically Abused: Not on file  . Sexually Abused: Not on file    ROS  General:  Negative for nexplained weight loss, fever Skin: Negative for new or changing mole, sore that won't heal HEENT: Negative for trouble hearing, trouble seeing, ringing in ears, mouth sores, hoarseness, change in voice, dysphagia. CV:  Negative for chest pain, dyspnea, edema, palpitations Resp: Negative for cough, dyspnea, hemoptysis GI: Positive for nausea, vomiting, negative for diarrhea, constipation, abdominal pain, melena, hematochezia. GU: Negative for dysuria, incontinence, urinary hesitance, hematuria, vaginal  or penile discharge, polyuria, sexual difficulty, lumps in testicle or breasts MSK: Positive for joint pain, swelling, negative for muscle cramps or aches Neuro: Negative for headaches, weakness, numbness, dizziness, passing out/fainting Psych: Positive for depression, anxiety, negative for memory problems  Objective  Physical Exam Vitals:   11/11/19 0813  BP: 120/70  Pulse: 61  Temp: 98.3 F (36.8 C)  SpO2: 95%    BP Readings from Last 3 Encounters:  11/11/19 120/70  06/05/19 120/70  11/29/18 120/70   Wt Readings from Last 3 Encounters:  11/11/19 238 lb 3.2 oz (108 kg)  06/05/19 253 lb (114.8 kg)  11/29/18 247 lb 9.6 oz (112.3 kg)    Physical Exam Constitutional:      General: She is not in acute distress.    Appearance: She is not diaphoretic.  HENT:  Head: Normocephalic and atraumatic.  Eyes:     Conjunctiva/sclera: Conjunctivae normal.     Pupils: Pupils are equal, round, and reactive to light.  Cardiovascular:     Rate and Rhythm: Normal rate and regular rhythm.     Heart sounds: Normal heart sounds.     Comments: 2 ectopic beats heard in the setting of sinus rhythm Pulmonary:     Effort: Pulmonary effort is normal.     Breath sounds: Normal breath sounds.  Abdominal:     General: Bowel sounds are normal. There is no distension.     Palpations: Abdomen is soft.     Tenderness: There is no abdominal tenderness. There is no guarding or rebound.  Genitourinary:    Comments: Fulton Mole, CMA served as chaperone, normal labia, normal vaginal mucosa, normal-appearing cervix with IUD strings visualized, uterus is midline and mobile, no adnexal masses or tenderness, bilateral breast with no skin changes, nipple inversion, masses, or tenderness, no axillary masses bilaterally Musculoskeletal:     Right lower leg: No edema.     Left lower leg: No edema.  Lymphadenopathy:     Cervical: No cervical adenopathy.  Skin:    General: Skin is warm and dry.    Neurological:     Mental Status: She is alert.      Assessment/Plan:   Encounter for general adult medical examination with abnormal findings Physical exam completed.  Encouraged walking as much as she is able to given her RA.  Encouraged healthy diet.  Pap smear completed today.  She will call to schedule her mammogram at [facility].  She will wait on her flu vaccine at her request.  Lab work previously reviewed from April.  She continues on vitamin D that is managed by her bariatric specialist.  Discussed that the leflunomide is likely contributing to her nausea and vomiting as it is a listed side effect.  I encouraged her to contact her rheumatologist regarding these symptoms.  Hypoglycemic disorder We will order a Dexcom and see if her insurance will cover this for this diagnosis.  I think it would be beneficial for her to have this information to help determine how low her sugar is dropping when she develops symptoms.  Anxiety and depression Stable.  She will continue her current medication regimen.   No orders of the defined types were placed in this encounter.   Meds ordered this encounter  Medications  . Continuous Blood Gluc Receiver (DEXCOM G6 RECEIVER) DEVI    Sig: Use 1 receiver to check glucose every 6 hours.  Diagnosis code E16.2    Dispense:  1 each    Refill:  0  . Continuous Blood Gluc Sensor (DEXCOM G6 SENSOR) MISC    Sig: Place 1 sensor on your arm every 10 days. Diagnosis code E16.2    Dispense:  3 each    Refill:  2  . Continuous Blood Gluc Transmit (DEXCOM G6 TRANSMITTER) MISC    Sig: Use 1 transmitter to check glucose every 6 hours.  Diagnosis code E16.2    Dispense:  1 each    Refill:  0    This visit occurred during the SARS-CoV-2 public health emergency.  Safety protocols were in place, including screening questions prior to the visit, additional usage of staff PPE, and extensive cleaning of exam room while observing appropriate contact time as indicated for  disinfecting solutions.    [name], MD [place]

## 2019-11-11 NOTE — Assessment & Plan Note (Signed)
Stable.  She will continue her current medication regimen.

## 2019-11-11 NOTE — Patient Instructions (Signed)
Nice to see you. Please try to stay active and continue to monitor your diet. We will contact you with your Pap smear results. Please call to schedule your mammogram. We will see about sending in the [facility] for you.

## 2019-11-11 NOTE — Assessment & Plan Note (Signed)
Physical exam completed.  Encouraged walking as much as she is able to given her RA.  Encouraged healthy diet.  Pap smear completed today.  She will call to schedule her mammogram at [facility].  She will wait on her flu vaccine at her request.  Lab work previously reviewed from April.  She continues on vitamin D that is managed by her bariatric specialist.  Discussed that the leflunomide is likely contributing to her nausea and vomiting as it is a listed side effect.  I encouraged her to contact her rheumatologist regarding these symptoms.

## 2019-11-12 LAB — CYTOLOGY - PAP
Comment: NEGATIVE
Diagnosis: NEGATIVE
High risk HPV: NEGATIVE

## 2019-11-18 ENCOUNTER — Telehealth: Payer: Self-pay

## 2019-11-18 MED ORDER — FREESTYLE LIBRE 14 DAY SENSOR MISC: 1 refills | Status: DC

## 2019-11-18 MED ORDER — FREESTYLE LIBRE 14 DAY READER DEVI: 0 refills | Status: DC

## 2019-11-18 NOTE — Telephone Encounter (Signed)
Sent to pharmacy 

## 2020-02-19 ENCOUNTER — Telehealth: Payer: Self-pay | Admitting: Family Medicine

## 2020-02-19 NOTE — Telephone Encounter (Signed)
Patient called in with covid symptoms wanted to come in office she decide to go to urgent care because of symptoms could not be seen today

## 2020-02-23 ENCOUNTER — Ambulatory Visit
Admission: EM | Admit: 2020-02-23 | Discharge: 2020-02-23 | Disposition: A | Discharge status: Home / Self Care | Payer: Managed Care, Other (non HMO) | Attending: Family Medicine | Admitting: Family Medicine

## 2020-02-23 ENCOUNTER — Other Ambulatory Visit: Payer: Self-pay

## 2020-02-23 DIAGNOSIS — U071 COVID-19: Secondary | ICD-10-CM | POA: Insufficient documentation

## 2020-02-23 LAB — RESP PANEL BY RT-PCR (RSV, FLU A&B, COVID)  RVPGX2
Influenza A by PCR: NEGATIVE
Influenza B by PCR: NEGATIVE
Resp Syncytial Virus by PCR: NEGATIVE
SARS Coronavirus 2 by RT PCR: POSITIVE — AB

## 2020-02-23 MED ORDER — BENZONATATE 200 MG PO CAPS: 200.0000 mg | ORAL_CAPSULE | Freq: Three times a day (TID) | ORAL | 0 refills | Status: DC | PRN

## 2020-02-23 NOTE — ED Provider Notes (Signed)
[facility]    CSN: [id number] Arrival date & time: 02/23/20  9935      History   Chief Complaint Chief Complaint  Patient presents with  . Cough  . loss taste/smell   HPI  44 year old female presents with multiple complaints.  Patient states that she has been sick since Tuesday.  She reports fatigue, headache, ear pain, cough, nausea, vomiting, diarrhea, loss of taste and smell.  Patient son recently had a rapid Covid test that was positive although his PCR test was negative.  Her husband is also symptomatic.  She feels very poorly.  She is particular bothered by the cough and the fatigue.  Currently afebrile.  She states that she is short of breath particularly when she lies down.  No other reported symptoms.  No other complaints or concerns at this time.  Past Medical History:  Diagnosis Date  . Frequent headaches   . GERD (gastroesophageal reflux disease)   . History of fainting spells of unknown cause   . Hypertension   . Kidney stones   . Migraine   . UTI (lower urinary tract infection)     Patient Active Problem List   Diagnosis Date Noted  . Right hip pain 06/05/2019  . Decreased sex drive 70/17/7939  . Osteopenia 06/05/2019  . Obesity (BMI 35.0-39.9 without comorbidity) 06/05/2019  . GERD (gastroesophageal reflux disease) 11/30/2018  . Skin lesion 11/30/2018  . Wrist nodule 11/30/2018  . Breast cancer screening 11/30/2018  . Hypoglycemic disorder 07/17/2017  . Anxiety and depression 05/17/2017  . Left leg swelling 11/17/2016  . 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  . Allergic rhinitis 06/20/2016  . Bradycardia 06/20/2016  . Right shoulder pain 06/20/2016  . Candidal intertrigo 06/20/2016  . Hemorrhoids 06/20/2016  . Soft tissue mass 06/20/2016  . Left lower quadrant pain 11/18/2015  . Encounter for general adult medical examination with abnormal findings 06/27/2015  . Back pain 06/22/2014  . Urinary frequency 06/22/2014  .  Right cervical radiculopathy 04/25/2014  . Left knee pain 04/25/2014    Past Surgical History:  Procedure Laterality Date  . CARPAL TUNNEL RELEASE  2005  . CHOLECYSTECTOMY  2015  . 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 GROWTH PLATE SURGERY  0300   Right hip    OB History   No obstetric history on file.      Home Medications    Prior to Admission medications   Medication Sig Start Date End Date Taking? Authorizing Provider  benzonatate (TESSALON) 200 MG capsule Take 1 capsule (200 mg total) by mouth 3 (three) times daily as needed for cough. 02/23/20  Yes Patryck Kilgore G, DO  DULoxetine (CYMBALTA) 30 MG capsule  07/11/19  Yes [provider]  montelukast (SINGULAIR) 10 MG tablet TAKE ONE TABLET BY MOUTH EVERY NIGHT AT BEDTIME 06/19/19  Yes Leone Haven, MD  ALPRAZolam Duanne Moron) 1 MG tablet Take 1 tablet (1 mg total) by mouth daily as needed for anxiety. 07/17/17   Leone Haven, MD  B Complex Vitamins (VITAMIN-B COMPLEX) TABS Take by mouth.    [provider]  blood glucose meter kit and supplies KIT Dispense based on patient and insurance preference. Check once daily as needed for hypoglycemic symptoms. Dx hypoglycemic symptoms. 07/17/17   Leone Haven, MD  clobetasol cream (TEMOVATE) 9.23 % Apply 1 application topically 2 (two) times daily as needed. For rash on hands, up to 3 weeks 06/18/19   Laurence Ferrari, [place], MD  Continuous Blood  Gluc Receiver (DEXCOM G6 RECEIVER) DEVI Use 1 receiver to check glucose every 6 hours.  Diagnosis code E16.2 11/11/19   Leone Haven, MD  Continuous Blood Gluc Receiver (FREESTYLE LIBRE 14 DAY READER) DEVI Use to check sugar 4 times daily 11/18/19   Leone Haven, MD  Continuous Blood Gluc Sensor (FREESTYLE LIBRE 14 DAY SENSOR) MISC Apply once every 14 days. 11/18/19   Leone Haven, MD  Continuous Blood Gluc Transmit (DEXCOM G6 TRANSMITTER) MISC Use 1 transmitter to check glucose every 6 hours.  Diagnosis code E16.2 11/11/19    Leone Haven, MD  cyclobenzaprine (FLEXERIL) 10 MG tablet Take 10 mg by mouth 3 (three) times daily as needed for muscle spasms.    [provider]  DULoxetine (CYMBALTA) 30 MG capsule Take 1 capsule (30 mg total) by mouth daily for 21 days, THEN 2 capsules (60 mg total) daily. 07/11/19 01/08/20  Leone Haven, MD  fexofenadine (ALLEGRA) 180 MG tablet Take 1 tablet (180 mg total) by mouth daily. 06/23/19   Leone Haven, MD  folic acid (FOLVITE) 1 MG tablet TAKE ONE TABLET BY MOUTH DAILY 10/03/19   Leone Haven, MD  hydrocortisone (ANUSOL-HC) 2.5 % rectal cream Place 1 application rectally 2 (two) times daily. 07/17/17   Leone Haven, MD  hydrocortisone 2.5 % cream Apply to affected areas twice daily as needed for up to 1 week. Can repeat in a month if needed 06/18/19   [facility], [place], MD  ketoconazole (NIZORAL) 2 % cream Apply to affected area twice daily as needed 06/18/19   [facility], [place], MD  levonorgestrel (LILETTA) 18.6 MCG/DAY IUD IUD by Intrauterine route.    [provider]  methocarbamol (ROBAXIN) 750 MG tablet Take 750 mg by mouth 3 (three) times daily.     [provider]  Multiple Vitamin (MULTI-VITAMINS) TABS Take by mouth.    [provider]  nystatin-triamcinolone ointment (MYCOLOG) Apply 1 application topically 2 (two) times daily. Patient not taking: Reported on 11/11/2019 07/17/17   Leone Haven, MD  omeprazole (PRILOSEC) 20 MG capsule Take by mouth. 11/06/18 12/06/18  [provider]  ondansetron (ZOFRAN-ODT) 4 MG disintegrating tablet  04/27/16   [provider]  buPROPion (WELLBUTRIN) 100 MG tablet TAKE ONE TABLET BY MOUTH THREE TIMES A DAY 10/03/19 02/23/20  Leone Haven, MD    Family History Family History  Problem Relation Age of Onset  . Arthritis Mother   . Hypertension Mother   . Mental illness Mother   . Diabetes Mother   . Alcohol abuse Father   . Cancer Father   . Hypertension  Father   . Mental illness Brother   . Heart disease Maternal Grandmother   . Hypertension Maternal Grandmother   . Mental illness Maternal Grandmother   . Diabetes Maternal Grandmother   . Cancer Maternal Grandfather   . Hypertension Maternal Grandfather   . Mental illness Paternal Grandmother     Social History Social History   Tobacco Use  . Smoking status: Never Smoker  . Smokeless tobacco: Never Used  Substance Use Topics  . Alcohol use: No    Alcohol/week: 0.0 standard drinks  . Drug use: No     Allergies   Penicillins, Oxycodone-acetaminophen, Tramadol, Codeine, and Tape   Review of Systems Review of Systems Per HPI  Physical Exam Triage Vital Signs ED Triage Vitals  Enc Vitals Group     BP 02/23/20 1003 135/79     Pulse  Rate 02/23/20 1003 (!) 57     Resp 02/23/20 1003 18     Temp 02/23/20 1003 98 F (36.7 C)     Temp Source 02/23/20 1003 Oral     SpO2 02/23/20 1003 100 %     Weight --      Height --      Head Circumference --      Peak Flow --      Pain Score 02/23/20 1004 5     Pain Loc --      Pain Edu? --      Excl. in [place]? --    Updated Vital Signs BP 135/79 (BP Location: Left Arm)   Pulse (!) 57   Temp 98 F (36.7 C) (Oral)   Resp 18   SpO2 100%   Visual Acuity Right Eye Distance:   Left Eye Distance:   Bilateral Distance:    Right Eye Near:   Left Eye Near:    Bilateral Near:     Physical Exam Vitals and nursing note reviewed.  Constitutional:      General: She is not in acute distress.    Comments: Ill-appearing.  HENT:     Head: Normocephalic and atraumatic.  Eyes:     General:        Right eye: No discharge.        Left eye: No discharge.     Conjunctiva/sclera: Conjunctivae normal.  Cardiovascular:     Rate and Rhythm: Regular rhythm. Bradycardia present.  Pulmonary:     Effort: Pulmonary effort is normal.     Breath sounds: Normal breath sounds. No wheezing, rhonchi or rales.  Neurological:     Mental Status: She  is alert.  Psychiatric:        Mood and Affect: Mood normal.        Behavior: Behavior normal.    UC Treatments / Results  Labs (all labs ordered are listed, but only abnormal results are displayed) Labs Reviewed  RESP PANEL BY RT-PCR (RSV, FLU A&B, COVID)  [id number] - Abnormal; Notable for the following components:      Result Value   SARS Coronavirus 2 by RT PCR POSITIVE (*)    All other components within normal limits    EKG   Radiology No results found.  Procedures Procedures (including critical care time)  Medications Ordered in UC Medications - No data to display  Initial Impression / Assessment and Plan / UC Course  I have reviewed the triage vital signs and the nursing notes.  Pertinent labs & imaging results that were available during my care of the patient were reviewed by me and considered in my medical decision making (see chart for details).    44 year old female presents with COVID-19.  Tessalon Perles for cough.  Information sent to infusion clinic.  Advised to go to the ER if she worsens.  Supportive care.  Final Clinical Impressions(s) / UC Diagnoses   Final diagnoses:  COVID     Discharge Instructions     Stay home.   Medication as directed for cough.  Take care  Dr. Lacinda Axon    ED Prescriptions    Medication Sig Dispense Auth. Provider   benzonatate (TESSALON) 200 MG capsule Take 1 capsule (200 mg total) by mouth 3 (three) times daily as needed for cough. 30 capsule Coral Spikes, DO     PDMP not reviewed this encounter.   Coral Spikes, [place] 02/23/20 1207

## 2020-02-23 NOTE — Discharge Instructions (Signed)
Stay home.   Medication as directed for cough.  Take care  Dr. Lacinda Axon

## 2020-02-23 NOTE — ED Triage Notes (Signed)
Pt c/o general malaise/fatigue onset Tuesday, HA, ear pain, non-productive cough Wednesday. Reports n/v/d onset Thursday that resolved on Friday.  Loss of taste/smell on Saturday. Pt states her son had positive rapid COVID test on Tuesday but son had negative PCR test same day and had similar symptoms as pt.  Denies n/v/d at present, SOB, fever.  Took 1000mg  tylenol at approx 0800

## 2020-02-24 ENCOUNTER — Encounter: Payer: Self-pay | Admitting: Family Medicine

## 2020-02-24 ENCOUNTER — Telehealth (INDEPENDENT_AMBULATORY_CARE_PROVIDER_SITE_OTHER): Payer: Managed Care, Other (non HMO) | Admitting: Family Medicine

## 2020-02-24 DIAGNOSIS — U071 COVID-19: Secondary | ICD-10-CM | POA: Diagnosis not present

## 2020-02-24 MED ORDER — FLUTICASONE PROPIONATE 50 MCG/ACT NA SUSP: 2.0000 | Freq: Every day | NASAL | 6 refills | Status: DC

## 2020-02-24 MED ORDER — HYDROCOD POLST-CPM POLST ER 10-8 MG/5ML PO SUER
5.0000 mL | Freq: Two times a day (BID) | ORAL | 0 refills | Status: DC | PRN
Start: 2020-02-24 — End: 2021-12-06

## 2020-02-24 NOTE — Assessment & Plan Note (Addendum)
Patient with COVID19 symptoms and positive test. Treatment is supportive. She has already been referred to the infusion clinic. I advised it could take 72 hours for them to contact her. I advised that she contact her rheumatologist to see when she can start the enbrel. We will treat her cough with tussionex as she has tolerated that previously. Discussed risk of drowsiness with this medication. Can treat congestion with flonase and allegra. Advised to go to the ED for worsening symptoms, shortness of breath, chest pain, or fevers of 103F or above. She will remain quarantined until 03/02/19 as she has already been advised to quarantine until then.

## 2020-02-24 NOTE — Progress Notes (Signed)
Virtual Visit via telephone Note  This visit type was conducted due to national recommendations for restrictions regarding the COVID-19 pandemic (e.g. social distancing).  This format is felt to be most appropriate for this patient at this time.  All issues noted in this document were discussed and addressed.  No physical exam was performed (except for noted visual exam findings with Video Visits).   I connected with Jill Hawkins today at  1:45 PM EST by telephone and verified that I am speaking with the correct person using two identifiers. Location patient: home Location provider: home office Persons participating in the virtual visit: patient, provider  I discussed the limitations, risks, security and privacy concerns of performing an evaluation and management service by telephone and the availability of in person appointments. I also discussed with the patient that there may be a patient responsible charge related to this service. The patient expressed understanding and agreed to proceed.  Interactive audio and video telecommunications were attempted between this provider and patient, however failed, due to patient having technical difficulties OR patient did not have access to video capability.  We continued and completed visit with audio only.   Reason for visit: same day visit  HPI: COVID19: patient notes onset of symptoms 12/21. She was exposed to her son who started to feel sick 12/20 and tested positive as well. The patient notes headache, fever 101 F max temp, cough, vomiting, diarrhea, chills, and loss of taste and smell. She notes only feeling short of breath after coughing significantly. She has not received her booster vaccine. She notes the tessalon from urgent care has not been beneficial. She did test positive at urgent care. She wonders when she can start on her enbrel from her rheumatologist.    ROS: See pertinent positives and negatives per HPI.  Past Medical History:   Diagnosis Date  . Frequent headaches   . GERD (gastroesophageal reflux disease)   . History of fainting spells of unknown cause   . Hypertension   . Kidney stones   . Migraine   . UTI (lower urinary tract infection)     Past Surgical History:  Procedure Laterality Date  . CARPAL TUNNEL RELEASE  2005  . CHOLECYSTECTOMY  2015  . 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 GROWTH PLATE SURGERY  6720   Right hip    Family History  Problem Relation Age of Onset  . Arthritis Mother   . Hypertension Mother   . Mental illness Mother   . Diabetes Mother   . Alcohol abuse Father   . Cancer Father   . Hypertension Father   . Mental illness Brother   . Heart disease Maternal Grandmother   . Hypertension Maternal Grandmother   . Mental illness Maternal Grandmother   . Diabetes Maternal Grandmother   . Cancer Maternal Grandfather   . Hypertension Maternal Grandfather   . Mental illness Paternal Grandmother     SOCIAL HX: non-smoker   Current Outpatient Medications:  .  ALPRAZolam (XANAX) 1 MG tablet, Take 1 tablet (1 mg total) by mouth daily as needed for anxiety., Disp: 30 tablet, Rfl: 0 .  B Complex Vitamins (VITAMIN-B COMPLEX) TABS, Take by mouth., Disp: , Rfl:  .  benzonatate (TESSALON) 200 MG capsule, Take 1 capsule (200 mg total) by mouth 3 (three) times daily as needed for cough., Disp: 30 capsule, Rfl: 0 .  blood glucose meter kit and supplies KIT, Dispense based on patient and insurance preference. Check once daily as  needed for hypoglycemic symptoms. Dx hypoglycemic symptoms., Disp: 1 each, Rfl: 0 .  chlorpheniramine-HYDROcodone (TUSSIONEX PENNKINETIC ER) 10-8 MG/5ML SUER, Take 5 mLs by mouth every 12 (twelve) hours as needed for cough., Disp: 140 mL, Rfl: 0 .  clobetasol cream (TEMOVATE) 0.05 %, Apply 1 application topically 2 (two) times daily as needed. For rash on hands, up to 3 weeks, Disp: 30 g, Rfl: 1 .  Continuous Blood Gluc Receiver (DEXCOM G6 RECEIVER) DEVI, Use 1 receiver to  check glucose every 6 hours.  Diagnosis code E16.2, Disp: 1 each, Rfl: 0 .  Continuous Blood Gluc Receiver (FREESTYLE LIBRE 14 DAY READER) DEVI, Use to check sugar 4 times daily, Disp: 1 each, Rfl: 0 .  Continuous Blood Gluc Sensor (FREESTYLE LIBRE 14 DAY SENSOR) MISC, Apply once every 14 days., Disp: 6 each, Rfl: 1 .  Continuous Blood Gluc Transmit (DEXCOM G6 TRANSMITTER) MISC, Use 1 transmitter to check glucose every 6 hours.  Diagnosis code E16.2, Disp: 1 each, Rfl: 0 .  cyclobenzaprine (FLEXERIL) 10 MG tablet, Take 10 mg by mouth 3 (three) times daily as needed for muscle spasms., Disp: , Rfl:  .  DULoxetine (CYMBALTA) 30 MG capsule, , Disp: , Rfl:  .  fexofenadine (ALLEGRA) 180 MG tablet, Take 1 tablet (180 mg total) by mouth daily., Disp: 90 tablet, Rfl: 1 .  fluticasone (FLONASE) 50 MCG/ACT nasal spray, Place 2 sprays into both nostrils daily., Disp: 16 g, Rfl: 6 .  folic acid (FOLVITE) 1 MG tablet, TAKE ONE TABLET BY MOUTH DAILY, Disp: 90 tablet, Rfl: 0 .  hydrocortisone (ANUSOL-HC) 2.5 % rectal cream, Place 1 application rectally 2 (two) times daily., Disp: 30 g, Rfl: 0 .  hydrocortisone 2.5 % cream, Apply to affected areas twice daily as needed for up to 1 week. Can repeat in a month if needed, Disp: 28 g, Rfl: 3 .  ketoconazole (NIZORAL) 2 % cream, Apply to affected area twice daily as needed, Disp: 30 g, Rfl: 7 .  levonorgestrel (LILETTA) 18.6 MCG/DAY IUD IUD, by Intrauterine route., Disp: , Rfl:  .  methocarbamol (ROBAXIN) 750 MG tablet, Take 750 mg by mouth 3 (three) times daily. , Disp: , Rfl:  .  montelukast (SINGULAIR) 10 MG tablet, TAKE ONE TABLET BY MOUTH EVERY NIGHT AT BEDTIME, Disp: 30 tablet, Rfl: 2 .  Multiple Vitamin (MULTI-VITAMINS) TABS, Take by mouth., Disp: , Rfl:  .  nystatin-triamcinolone ointment (MYCOLOG), Apply 1 application topically 2 (two) times daily., Disp: 30 g, Rfl: 0 .  ondansetron (ZOFRAN-ODT) 4 MG disintegrating tablet, , Disp: , Rfl:  .  DULoxetine  (CYMBALTA) 30 MG capsule, Take 1 capsule (30 mg total) by mouth daily for 21 days, THEN 2 capsules (60 mg total) daily., Disp: 180 capsule, Rfl: 1 .  omeprazole (PRILOSEC) 20 MG capsule, Take by mouth., Disp: , Rfl:   EXAM: This was a telephone visit and no physical exam was completed.   ASSESSMENT AND PLAN:  Discussed the following assessment and plan:  Problem List Items Addressed This Visit    COVID-19    Patient with COVID19 symptoms and positive test. Treatment is supportive. She has already been referred to the infusion clinic. I advised it could take 72 hours for them to contact her. I advised that she contact her rheumatologist to see when she can start the enbrel. We will treat her cough with tussionex as she has tolerated that previously. Discussed risk of drowsiness with this medication. Can treat congestion with flonase and allegra.  Advised to go to the ED for worsening symptoms, shortness of breath, chest pain, or fevers of 103F or above. She will remain quarantined until 03/02/19 as she has already been advised to quarantine until then.       Relevant Medications   fluticasone (FLONASE) 50 MCG/ACT nasal spray   chlorpheniramine-HYDROcodone (TUSSIONEX PENNKINETIC ER) 10-8 MG/5ML SUER       I discussed the assessment and treatment plan with the patient. The patient was provided an opportunity to ask questions and all were answered. The patient agreed with the plan and demonstrated an understanding of the instructions.   The patient was advised to call back or seek an in-person evaluation if the symptoms worsen or if the condition fails to improve as anticipated.  I provided 16 minutes of non-face-to-face time during this encounter.   [name], MD

## 2020-03-10 ENCOUNTER — Ambulatory Visit
Admission: RE | Admit: 2020-03-10 | Discharge: 2020-03-10 | Disposition: A | Discharge status: Home / Self Care | Payer: Managed Care, Other (non HMO) | Source: Ambulatory Visit | Attending: Family Medicine | Admitting: Family Medicine

## 2020-03-10 ENCOUNTER — Other Ambulatory Visit: Payer: Self-pay

## 2020-03-10 ENCOUNTER — Telehealth (INDEPENDENT_AMBULATORY_CARE_PROVIDER_SITE_OTHER): Payer: Managed Care, Other (non HMO) | Admitting: Family Medicine

## 2020-03-10 ENCOUNTER — Other Ambulatory Visit
Admission: RE | Admit: 2020-03-10 | Discharge: 2020-03-10 | Disposition: A | Discharge status: Home / Self Care | Payer: Managed Care, Other (non HMO) | Source: Ambulatory Visit | Attending: Family Medicine | Admitting: Family Medicine

## 2020-03-10 ENCOUNTER — Telehealth: Payer: Self-pay | Admitting: Family Medicine

## 2020-03-10 DIAGNOSIS — N644 Mastodynia: Secondary | ICD-10-CM

## 2020-03-10 DIAGNOSIS — U071 COVID-19: Secondary | ICD-10-CM | POA: Diagnosis not present

## 2020-03-10 DIAGNOSIS — R3915 Urgency of urination: Secondary | ICD-10-CM | POA: Insufficient documentation

## 2020-03-10 DIAGNOSIS — N9089 Other specified noninflammatory disorders of vulva and perineum: Secondary | ICD-10-CM | POA: Diagnosis not present

## 2020-03-10 LAB — URINALYSIS, ROUTINE W REFLEX MICROSCOPIC
Bilirubin Urine: NEGATIVE
Glucose, UA: NEGATIVE mg/dL
Hgb urine dipstick: NEGATIVE
Ketones, ur: NEGATIVE mg/dL
Leukocytes,Ua: NEGATIVE
Nitrite: NEGATIVE
Protein, ur: NEGATIVE mg/dL
Specific Gravity, Urine: 1.021 (ref 1.005–1.030)
pH: 5 (ref 5.0–8.0)

## 2020-03-10 MED ORDER — SULFAMETHOXAZOLE-TRIMETHOPRIM 800-160 MG PO TABS: 1.0000 | ORAL_TABLET | Freq: Two times a day (BID) | ORAL | 0 refills | Status: DC

## 2020-03-10 NOTE — Telephone Encounter (Signed)
Patient consented to virtual visit, stating she "would do this but it is delay of care that she works at [employer] and we are just being to careful.." Advised we have immune compromised and elder patient at high risk and that her provider is not fit tested for the appropriate PPE due to N95 does not properly and that we do not have access to [place]. Patient finally agreed to virtual she does not want to wait 2 months to see OBGYN.

## 2020-03-10 NOTE — Assessment & Plan Note (Signed)
I suspect her cough is related to postviral cough though given continued cough and some chest tightness we will get a chest x-ray to rule out an underlying cause.  [facility].

## 2020-03-10 NOTE — Assessment & Plan Note (Signed)
Diagnostic mammogram and ultrasound ordered

## 2020-03-10 NOTE — Assessment & Plan Note (Signed)
[facility] will [facility].  We will send for culture as well.  Treating possible labial cyst or boil with Bactrim and that should cover for UTI as well.

## 2020-03-10 NOTE — Progress Notes (Signed)
Virtual Visit via telephone Note  This visit type was conducted due to national recommendations for restrictions regarding the COVID-19 pandemic (e.g. social distancing).  This format is felt to be most appropriate for this patient at this time.  All issues noted in this document were discussed and addressed.  No physical exam was performed (except for noted visual exam findings with Video Visits).   I connected with Jill Hawkins today at  9:30 AM EST by a video enabled telemedicine application and verified that I am speaking with the correct person using two identifiers. Location patient: home Location provider: work Persons participating in the virtual visit: patient, provider  I discussed the limitations, risks, security and privacy concerns of performing an evaluation and management service by telephone and the availability of in person appointments. I also discussed with the patient that there may be a patient responsible charge related to this service. The patient expressed understanding and agreed to proceed.  Interactive audio and video telecommunications were attempted between this provider and patient, however failed, due to patient having technical difficulties OR patient did not have access to video capability.  We continued and completed visit with audio only.   Reason for visit: same day visit  HPI: Labial lesion: Patient notes history of this in the past.  They would typically be small and go away.  She notes 1 developed around 12/27.  Became larger on 1/9 and started to drain some.  It is smaller today.  There is some pain and tenderness.  She is unable to see it given the location.  It is on the left side.  No fevers.  Urinary frequency: Patient notes recent onset of this with urgency.  She had nocturia x3.  No dysuria.  No hematuria.  No abdominal pain.  History of COVID-19: Patient reports continued intermittent issues with cough.  It is worse in the morning and at night.   Her chest does feel somewhat tight.  Notes the cough does not occur all the time and does fairly well during the day.  She has not had any fevers.  She does note she had some GI issues with COVID and notes those are not quite right yet though they are better.  Right breast pain:.  She notes there is pain around the 10:00 location.  It is not in her armpit.  There are no lumps or masses palpated.  Notes this has been going on since before she had COVID.  No left breast pain.   ROS: See pertinent positives and negatives per HPI.  Past Medical History:  Diagnosis Date  . Frequent headaches   . GERD (gastroesophageal reflux disease)   . History of fainting spells of unknown cause   . Hypertension   . Kidney stones   . Migraine   . UTI (lower urinary tract infection)     Past Surgical History:  Procedure Laterality Date  . CARPAL TUNNEL RELEASE  2005  . CHOLECYSTECTOMY  2015  . 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 GROWTH PLATE SURGERY  9774   Right hip    Family History  Problem Relation Age of Onset  . Arthritis Mother   . Hypertension Mother   . Mental illness Mother   . Diabetes Mother   . Alcohol abuse Father   . Cancer Father   . Hypertension Father   . Mental illness Brother   . Heart disease Maternal Grandmother   . Hypertension Maternal Grandmother   . Mental illness Maternal  Grandmother   . Diabetes Maternal Grandmother   . Cancer Maternal Grandfather   . Hypertension Maternal Grandfather   . Mental illness Paternal Grandmother     SOCIAL HX: Non-smoker   Current Outpatient Medications:  .  ALPRAZolam (XANAX) 1 MG tablet, Take 1 tablet (1 mg total) by mouth daily as needed for anxiety., Disp: 30 tablet, Rfl: 0 .  B Complex Vitamins (VITAMIN-B COMPLEX) TABS, Take by mouth., Disp: , Rfl:  .  benzonatate (TESSALON) 200 MG capsule, Take 1 capsule (200 mg total) by mouth 3 (three) times daily as needed for cough., Disp: 30 capsule, Rfl: 0 .  blood glucose meter kit and  supplies KIT, Dispense based on patient and insurance preference. Check once daily as needed for hypoglycemic symptoms. Dx hypoglycemic symptoms., Disp: 1 each, Rfl: 0 .  chlorpheniramine-HYDROcodone (TUSSIONEX PENNKINETIC ER) 10-8 MG/5ML SUER, Take 5 mLs by mouth every 12 (twelve) hours as needed for cough., Disp: 140 mL, Rfl: 0 .  clobetasol cream (TEMOVATE) 8.76 %, Apply 1 application topically 2 (two) times daily as needed. For rash on hands, up to 3 weeks, Disp: 30 g, Rfl: 1 .  Continuous Blood Gluc Receiver (DEXCOM G6 RECEIVER) DEVI, Use 1 receiver to check glucose every 6 hours.  Diagnosis code E16.2, Disp: 1 each, Rfl: 0 .  Continuous Blood Gluc Receiver (FREESTYLE LIBRE 14 DAY READER) DEVI, Use to check sugar 4 times daily, Disp: 1 each, Rfl: 0 .  Continuous Blood Gluc Sensor (FREESTYLE LIBRE 14 DAY SENSOR) MISC, Apply once every 14 days., Disp: 6 each, Rfl: 1 .  Continuous Blood Gluc Transmit (DEXCOM G6 TRANSMITTER) MISC, Use 1 transmitter to check glucose every 6 hours.  Diagnosis code E16.2, Disp: 1 each, Rfl: 0 .  cyclobenzaprine (FLEXERIL) 10 MG tablet, Take 10 mg by mouth 3 (three) times daily as needed for muscle spasms., Disp: , Rfl:  .  DULoxetine (CYMBALTA) 30 MG capsule, , Disp: , Rfl:  .  fexofenadine (ALLEGRA) 180 MG tablet, Take 1 tablet (180 mg total) by mouth daily., Disp: 90 tablet, Rfl: 1 .  fluticasone (FLONASE) 50 MCG/ACT nasal spray, Place 2 sprays into both nostrils daily., Disp: 16 g, Rfl: 6 .  folic acid (FOLVITE) 1 MG tablet, TAKE ONE TABLET BY MOUTH DAILY, Disp: 90 tablet, Rfl: 0 .  hydrocortisone (ANUSOL-HC) 2.5 % rectal cream, Place 1 application rectally 2 (two) times daily., Disp: 30 g, Rfl: 0 .  hydrocortisone 2.5 % cream, Apply to affected areas twice daily as needed for up to 1 week. Can repeat in a month if needed, Disp: 28 g, Rfl: 3 .  ketoconazole (NIZORAL) 2 % cream, Apply to affected area twice daily as needed, Disp: 30 g, Rfl: 7 .  levonorgestrel  (LILETTA) 18.6 MCG/DAY IUD IUD, by Intrauterine route., Disp: , Rfl:  .  methocarbamol (ROBAXIN) 750 MG tablet, Take 750 mg by mouth 3 (three) times daily. , Disp: , Rfl:  .  montelukast (SINGULAIR) 10 MG tablet, TAKE ONE TABLET BY MOUTH EVERY NIGHT AT BEDTIME, Disp: 30 tablet, Rfl: 2 .  Multiple Vitamin (MULTI-VITAMINS) TABS, Take by mouth., Disp: , Rfl:  .  nystatin-triamcinolone ointment (MYCOLOG), Apply 1 application topically 2 (two) times daily., Disp: 30 g, Rfl: 0 .  ondansetron (ZOFRAN-ODT) 4 MG disintegrating tablet, , Disp: , Rfl:  .  DULoxetine (CYMBALTA) 30 MG capsule, Take 1 capsule (30 mg total) by mouth daily for 21 days, THEN 2 capsules (60 mg total) daily., Disp: 180 capsule, Rfl:  1 .  omeprazole (PRILOSEC) 20 MG capsule, Take by mouth., Disp: , Rfl:  .  sulfamethoxazole-trimethoprim (BACTRIM DS) 800-160 MG tablet, Take 1 tablet by mouth 2 (two) times daily., Disp: 14 tablet, Rfl: 0  EXAM: This is a telephone visit and thus no physical exam was completed.  ASSESSMENT AND PLAN:  Discussed the following assessment and plan:  Problem List Items Addressed This Visit    Breast pain, right    Diagnostic mammogram and ultrasound ordered.      Relevant Orders   MM DIAG BREAST TOMO BILATERAL   US BREAST LTD UNI RIGHT INC AXILLA   COVID-19    I suspect her cough is related to postviral cough though given continued cough and some chest tightness we will get a chest x-ray to rule out an underlying cause.  She can [facility] medical mall to have this completed.      Relevant Medications   sulfamethoxazole-trimethoprim (BACTRIM DS) 800-160 MG tablet   Other Relevant Orders   DG Chest 2 View   Labial lesion    Concerning for an infected cyst versus a boil.  We will treat with Bactrim.  We will also reach out to the patient's GYN to see if they can get her a sooner appointment.  She will let us know if she has any worsening symptoms.      Relevant Medications    sulfamethoxazole-trimethoprim (BACTRIM DS) 800-160 MG tablet   Other Relevant Orders   Ambulatory referral to Gynecology   Urinary urgency    She will complete a urinalysis [facility] mall at the [facility].  We will send for culture as well.  Treating possible labial cyst or boil with Bactrim and that should cover for UTI as well.      Relevant Orders   Urinalysis, Routine w reflex microscopic   Urine Culture       I discussed the assessment and treatment plan with the patient. The patient was provided an opportunity to ask questions and all were answered. The patient agreed with the plan and demonstrated an understanding of the instructions.   The patient was advised to call back or seek an in-person evaluation if the symptoms worsen or if the condition fails to improve as anticipated.  I provided 15 minutes of non-face-to-face time during this encounter.   [name], MD

## 2020-03-10 NOTE — Telephone Encounter (Signed)
Noted. Telephone visit completed.

## 2020-03-10 NOTE — Assessment & Plan Note (Addendum)
Concerning for an infected cyst versus a boil.  We will treat with Bactrim.  We will also reach out to the patient's GYN to see if they can get her a sooner appointment.  She will let us know if she has any worsening symptoms.

## 2020-03-11 LAB — URINE CULTURE: Culture: <10000 — AB

## 2020-03-12 ENCOUNTER — Other Ambulatory Visit: Payer: Self-pay | Admitting: Dermatology

## 2020-03-12 DIAGNOSIS — L309 Dermatitis, unspecified: Secondary | ICD-10-CM

## 2020-03-20 ENCOUNTER — Encounter: Payer: Self-pay | Admitting: Dermatology

## 2020-03-22 ENCOUNTER — Other Ambulatory Visit: Payer: Self-pay

## 2020-03-22 MED ORDER — CLOBETASOL PROP EMOLLIENT BASE 0.05 % EX CREA: TOPICAL_CREAM | CUTANEOUS | 0 refills | Status: DC

## 2020-04-16 ENCOUNTER — Ambulatory Visit: Payer: Managed Care, Other (non HMO) | Admitting: Family Medicine

## 2020-04-22 ENCOUNTER — Other Ambulatory Visit: Payer: Self-pay

## 2020-04-22 ENCOUNTER — Ambulatory Visit: Payer: Managed Care, Other (non HMO) | Admitting: Dermatology

## 2020-04-22 DIAGNOSIS — L918 Other hypertrophic disorders of the skin: Secondary | ICD-10-CM

## 2020-04-22 DIAGNOSIS — D485 Neoplasm of uncertain behavior of skin: Secondary | ICD-10-CM | POA: Diagnosis not present

## 2020-04-22 DIAGNOSIS — T8090XA Unspecified complication following infusion and therapeutic injection, initial encounter: Secondary | ICD-10-CM

## 2020-04-22 DIAGNOSIS — L309 Dermatitis, unspecified: Secondary | ICD-10-CM | POA: Diagnosis not present

## 2020-04-22 MED ORDER — EUCRISA 2 % EX OINT: 1.0000 "application " | TOPICAL_OINTMENT | Freq: Two times a day (BID) | CUTANEOUS | 11 refills | Status: DC

## 2020-04-22 NOTE — Progress Notes (Signed)
   Follow-Up Visit   Subjective  Jill Hawkins is a 45 y.o. female who presents for the following: Follow-up (Patient here today follow up on hand dermatitis on bilateral hands. Hands are still irritated today. Patient would also like to discuss skin tags on neck that are irritated. Patient is getting enbrel injections but has noticed some redness around site injections. ).  Patient had been using dove antibacterial soap and aloe hand sanitizer.   The following portions of the chart were reviewed this encounter and updated as appropriate:  Tobacco  Allergies  Meds  Problems  Med Hx  Surg Hx  Fam Hx        Objective  Well appearing patient in no apparent distress; mood and affect are within normal limits.  A focused examination was performed including neck, bilateral hands, bilateral thighs . Relevant physical exam findings are noted in the Assessment and Plan.  Objective  bilateral hands: Scaly erythematous plaques  Objective  left thigh: Erythematous patch  Objective  anterior neck and left neck: erythematous tan papules  Assessment & Plan  Dermatitis bilateral hands  Chronic condition with expected duration over one year expected to be worse in winter months due to dry air. Condition is bothersome to patient. Currently flared despite treatment.  Continue moisturizers included gentle skin care guide in hand out   D/c  Clobetasol cream   Start Eucrisa 2 % ointment apply to affected areas of hands twice daily.   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  Return in about 1 month (around 05/20/2020) for follow up for dermatitis on bilateral hands and f/u on acrochordon .  I, Ruthell Rummage, CMA, am acting as scribe for Forest Gleason, MD.  Documentation: I have reviewed the above documentation for accuracy and completeness, and I agree with the above.  Forest Gleason, MD

## 2020-04-22 NOTE — Patient Instructions (Addendum)
Gentle Skin Care Guide  1. Bathe no more than once a day.  2. Avoid bathing in hot water  3. Use a mild soap like Dove, Vanicream, Cetaphil, CeraVe. Can use Lever 2000 or Cetaphil antibacterial soap  4. Use soap only where you need it. On most days, use it under your arms, between your legs, and on your feet. Let the water rinse other areas unless visibly dirty.  5. When you get out of the bath/shower, use a towel to gently blot your skin dry, don't rub it.  6. While your skin is still a little damp, apply a moisturizing cream such as Vanicream, CeraVe, Cetaphil, Eucerin, Sarna lotion or plain Vaseline Jelly. For hands apply Neutrogena [place] Hand Cream or Excipial Hand Cream.  7. Reapply moisturizer any time you start to itch or feel dry.  8. Sometimes using free and clear laundry detergents can be helpful. Fabric softener sheets should be avoided. Downy Free & Gentle liquid, or any liquid fabric softener that is free of dyes and perfumes, it acceptable to use  9. If your doctor has given you prescription creams you may apply moisturizers over them

## 2020-04-26 ENCOUNTER — Encounter: Payer: Self-pay | Admitting: Dermatology

## 2020-05-06 ENCOUNTER — Other Ambulatory Visit: Payer: Self-pay

## 2020-05-10 ENCOUNTER — Ambulatory Visit: Payer: Managed Care, Other (non HMO) | Admitting: Family Medicine

## 2020-05-10 ENCOUNTER — Encounter: Payer: Self-pay | Admitting: Family Medicine

## 2020-05-10 ENCOUNTER — Other Ambulatory Visit: Payer: Self-pay

## 2020-05-10 DIAGNOSIS — N644 Mastodynia: Secondary | ICD-10-CM

## 2020-05-10 DIAGNOSIS — G8929 Other chronic pain: Secondary | ICD-10-CM

## 2020-05-10 DIAGNOSIS — K625 Hemorrhage of anus and rectum: Secondary | ICD-10-CM | POA: Diagnosis not present

## 2020-05-10 DIAGNOSIS — M7711 Lateral epicondylitis, right elbow: Secondary | ICD-10-CM | POA: Diagnosis not present

## 2020-05-10 DIAGNOSIS — M25511 Pain in right shoulder: Secondary | ICD-10-CM | POA: Diagnosis not present

## 2020-05-10 DIAGNOSIS — R600 Localized edema: Secondary | ICD-10-CM | POA: Insufficient documentation

## 2020-05-10 LAB — COMPREHENSIVE METABOLIC PANEL
ALT: 29 U/L (ref 0–35)
AST: 35 U/L (ref 0–37)
Albumin: 3.7 g/dL (ref 3.5–5.2)
Alkaline Phosphatase: 59 U/L (ref 39–117)
BUN: 12 mg/dL (ref 6–23)
CO2: 28 mEq/L (ref 19–32)
Calcium: 8.9 mg/dL (ref 8.4–10.5)
Chloride: 107 mEq/L (ref 96–112)
Creatinine, Ser: 0.37 mg/dL — ABNORMAL LOW (ref 0.40–1.20)
GFR: 123.9 mL/min (ref >60.00)
Glucose, Bld: 77 mg/dL (ref 70–99)
Potassium: 4.2 mEq/L (ref 3.5–5.1)
Sodium: 139 mEq/L (ref 135–145)
Total Bilirubin: 0.7 mg/dL (ref 0.2–1.2)
Total Protein: 6.5 g/dL (ref 6.0–8.3)

## 2020-05-10 LAB — BRAIN NATRIURETIC PEPTIDE: Pro B Natriuretic peptide (BNP): 266 pg/mL — ABNORMAL HIGH (ref 0.0–100.0)

## 2020-05-10 LAB — CBC
HCT: 30.4 % — ABNORMAL LOW (ref 36.0–46.0)
Hemoglobin: 10.1 g/dL — ABNORMAL LOW (ref 12.0–15.0)
MCHC: 33.2 g/dL (ref 30.0–36.0)
MCV: 85.8 fl (ref 78.0–100.0)
Platelets: 152 10*3/uL (ref 150.0–400.0)
RBC: 3.55 Mil/uL — ABNORMAL LOW (ref 3.87–5.11)
RDW: 15.8 % — ABNORMAL HIGH (ref 11.5–15.5)
WBC: 2.9 10*3/uL — ABNORMAL LOW (ref 4.0–10.5)

## 2020-05-10 LAB — TSH: TSH: 1.63 u[IU]/mL (ref 0.35–4.50)

## 2020-05-10 NOTE — Assessment & Plan Note (Signed)
Chronic intermittent issue.  Possible rotator cuff impingement versus other muscular issue in her shoulder.  Will refer to [facility].

## 2020-05-10 NOTE — Assessment & Plan Note (Signed)
Discussed icing.  Given severity and chronicity will refer to orthopedics.

## 2020-05-10 NOTE — Patient Instructions (Signed)
Nice to see you. We will get labs today. We will get you referred to orthopedics and GI. If you have recurrence of blood in your stool please let us know right away. Please ice your elbow as we discussed.

## 2020-05-10 NOTE — Assessment & Plan Note (Signed)
I suspect this is related to her hemorrhoids though given the quantity we will refer her back to GI.  Discussed that it would be unlikely related to a significant lesion such as a cancer given her recent normal colonoscopy though we will have GI evaluate her for potential need for sigmoidoscopy versus repeat colonoscopy.

## 2020-05-10 NOTE — Assessment & Plan Note (Signed)
This does not seem to be actual breast pain.  Seems to be a muscular issue.  She had a negative mammogram.

## 2020-05-10 NOTE — Progress Notes (Signed)
[name], MD Phone: [phone]  Jill Hawkins is a 45 y.o. female who presents today for f/u.  Leg swelling: Patient notes this has been going on for about a week or so.  It is left greater than right.  She sits a lot at work.  The swelling does go down overnight though builds up through the day.  No orthopnea, PND, shortness of breath, chest pain, or leg pain.  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  No history of swelling.  Does note her mother had heart disease.  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  She has previously seen orthopedics for this though notes they did not do anything.  It is worse in the last couple of weeks.  Comes and goes.  Hurts to lift a cup.  She tried Biofreeze and a brace.  Right breast pain: Patient had negative mammogram.  She notes the radiologist wondered if she had a muscular issue given that she has chronic right shoulder pain.  Notes there is still some discomfort.  Chronic right shoulder pain: This is an ongoing issue.  She saw orthopedics and they advised that she do physical therapy.  There is no significant benefit with physical therapy.  Notes it hurts all the time though is worse with movements of her shoulder.  Bright red blood per rectum: Patient notes this occurred on 01/03/2020.  She had 2 episodes where she passed only blood.  She has not had any recurrence of this.  No pain with bowel movements.  She does have a history of hemorrhoids.  Reports colonoscopy in 2019.  There were no polyps on this based on review of care everywhere.  She did have internal hemorrhoids.  Social History   Tobacco Use  Smoking Status Never Smoker  Smokeless Tobacco Never Used    Current Outpatient Medications on File Prior to Visit  Medication Sig Dispense Refill  . ALPRAZolam (XANAX) 1 MG tablet Take 1 tablet (1 mg total) by mouth daily as needed for anxiety. 30 tablet 0  . B Complex Vitamins (VITAMIN-B  COMPLEX) TABS Take by mouth.    . benzonatate (TESSALON) 200 MG capsule Take 1 capsule (200 mg total) by mouth 3 (three) times daily as needed for cough. 30 capsule 0  . blood glucose meter kit and supplies KIT Dispense based on patient and insurance preference. Check once daily as needed for hypoglycemic symptoms. Dx hypoglycemic symptoms. 1 each 0  . chlorpheniramine-HYDROcodone (TUSSIONEX PENNKINETIC ER) 10-8 MG/5ML SUER Take 5 mLs by mouth every 12 (twelve) hours as needed for cough. 140 mL 0  . Clobetasol Prop Emollient Base (CLOBETASOL PROPIONATE E) 0.05 % emollient cream Apply to skin once or twice a day as needed, avoid face, groin, axilla 30 g 0  . Continuous Blood Gluc Receiver (DEXCOM G6 RECEIVER) DEVI Use 1 receiver to check glucose every 6 hours.  Diagnosis code E16.2 1 each 0  . Continuous Blood Gluc Receiver (FREESTYLE LIBRE 14 DAY READER) DEVI Use to check sugar 4 times daily 1 each 0  . Continuous Blood Gluc Sensor (FREESTYLE LIBRE 14 DAY SENSOR) MISC Apply once every 14 days. 6 each 1  . Continuous Blood Gluc Transmit (DEXCOM G6 TRANSMITTER) MISC Use 1 transmitter to check glucose every 6 hours.  Diagnosis code E16.2 1 each 0  . Crisaborole (EUCRISA) 2 % OINT Apply 1 application topically in the morning and at bedtime. Apply to  affected areas of hands 60 g 11  . cyclobenzaprine (FLEXERIL) 10 MG tablet Take 10 mg by mouth 3 (three) times daily as needed for muscle spasms.    . DULoxetine (CYMBALTA) 30 MG capsule     . etanercept (ENBREL SURECLICK) 50 MG/ML injection Inject 50 mLs into the skin once a week. On friday    . fexofenadine (ALLEGRA) 180 MG tablet Take 1 tablet (180 mg total) by mouth daily. 90 tablet 1  . fluticasone (FLONASE) 50 MCG/ACT nasal spray Place 2 sprays into both nostrils daily. 16 g 6  . folic acid (FOLVITE) 1 MG tablet TAKE ONE TABLET BY MOUTH DAILY 90 tablet 0  . hydrocortisone (ANUSOL-HC) 2.5 % rectal cream Place 1 application rectally 2 (two) times daily.  30 g 0  . hydrocortisone 2.5 % cream Apply to affected areas twice daily as needed for up to 1 week. Can repeat in a month if needed 28 g 3  . ketoconazole (NIZORAL) 2 % cream Apply to affected area twice daily as needed 30 g 7  . levonorgestrel (LILETTA) 18.6 MCG/DAY IUD IUD by Intrauterine route.    . methocarbamol (ROBAXIN) 750 MG tablet Take 750 mg by mouth 3 (three) times daily.     . montelukast (SINGULAIR) 10 MG tablet TAKE ONE TABLET BY MOUTH EVERY NIGHT AT BEDTIME 30 tablet 2  . Multiple Vitamin (MULTI-VITAMINS) TABS Take by mouth.    . nystatin-triamcinolone ointment (MYCOLOG) Apply 1 application topically 2 (two) times daily. 30 g 0  . ondansetron (ZOFRAN-ODT) 4 MG disintegrating tablet     . Pancrelipase, Lip-Prot-Amyl, (ZENPEP) 40000-126000 units CPEP Take by mouth. Take 2 capsules by mouth 4 times a day with meals and nightly for 180 days . Take 2 capsules with meals and 1 with snacks    . sulfamethoxazole-trimethoprim (BACTRIM DS) 800-160 MG tablet Take 1 tablet by mouth 2 (two) times daily. 14 tablet 0  . DULoxetine (CYMBALTA) 30 MG capsule Take 1 capsule (30 mg total) by mouth daily for 21 days, THEN 2 capsules (60 mg total) daily. 180 capsule 1  . omeprazole (PRILOSEC) 20 MG capsule Take by mouth.    . [DISCONTINUED] buPROPion (WELLBUTRIN) 100 MG tablet TAKE ONE TABLET BY MOUTH THREE TIMES A DAY 90 tablet 0   No current facility-administered medications on file prior to visit.     ROS see history of present illness  Objective  Physical Exam Vitals:   05/10/20 1009  BP: 130/80  Pulse: (!) 43  Temp: 98 F (36.7 C)  SpO2: 99%    BP Readings from Last 3 Encounters:  05/10/20 130/80  02/23/20 135/79  11/11/19 120/70   Wt Readings from Last 3 Encounters:  05/10/20 251 lb (113.9 kg)  02/24/20 245 lb (111.1 kg)  11/11/19 238 lb 3.2 oz (108 kg)    Physical Exam Constitutional:      General: She is not in acute distress.    Appearance: She is not diaphoretic.   Cardiovascular:     Rate and Rhythm: Normal rate and regular rhythm.     Heart sounds: Normal heart sounds.  Pulmonary:     Effort: Pulmonary effort is normal.     Breath sounds: Normal breath sounds.  Musculoskeletal:     Comments: Tenderness over the right lateral epicondyle of the right elbow.  No swelling, warmth, or erythema, there is discomfort in this area on resisted pronation and supination.  Right shoulder with tenderness over the musculature surrounding the scapula, no  other shoulder tenderness, there is discomfort on internal and external range of motion passively, patient also has tenderness over her pectoralis muscle near the attachment her shoulder, Fulton Mole, CMA served as chaperone  Skin:    General: Skin is warm and dry.  Neurological:     Mental Status: She is alert.      Assessment/Plan: Please see individual problem list.  Problem List Items Addressed This Visit    Bilateral leg edema    Possible venous insufficiency.  She does not have any symptoms significant for cardiac cause though does report quite a bit of weight gain.  Our scale does seem to be accurate.  We will check lab work as outlined below.      Relevant Orders   B Nat Peptide   Comp Met (CMET)   TSH   CBC   Breast pain, right    This does not seem to be actual breast pain.  Seems to be a muscular issue.  She had a negative mammogram.      Bright red blood per rectum    I suspect this is related to her hemorrhoids though given the quantity we will refer her back to GI.  Discussed that it would be unlikely related to a significant lesion such as a cancer given her recent normal colonoscopy though we will have GI evaluate her for potential need for sigmoidoscopy versus repeat colonoscopy.      Relevant Orders   Ambulatory referral to Gastroenterology   Lateral epicondylitis of right elbow    Discussed icing.  Given severity and chronicity will refer to orthopedics.      Relevant Orders    Ambulatory referral to Orthopedic Surgery   Right shoulder pain    Chronic intermittent issue.  Possible rotator cuff impingement versus other muscular issue in her shoulder.  Will refer to [facility].      Relevant Orders   Ambulatory referral to Orthopedic Surgery       This visit occurred during the SARS-CoV-2 public health emergency.  Safety protocols were in place, including screening questions prior to the visit, additional usage of staff PPE, and extensive cleaning of exam room while observing appropriate contact time as indicated for disinfecting solutions.   I have spent 31 minutes in the care of this patient regarding history taking, documentation, physical exam, discussion of plan, placing orders.   [name], MD [place]

## 2020-05-10 NOTE — Assessment & Plan Note (Signed)
Possible venous insufficiency.  She does not have any symptoms significant for cardiac cause though does report quite a bit of weight gain.  Our scale does seem to be accurate.  We will check lab work as outlined below.

## 2020-05-11 ENCOUNTER — Encounter: Payer: Self-pay | Admitting: Family Medicine

## 2020-05-11 DIAGNOSIS — R7989 Other specified abnormal findings of blood chemistry: Secondary | ICD-10-CM

## 2020-05-11 DIAGNOSIS — D649 Anemia, unspecified: Secondary | ICD-10-CM

## 2020-05-11 DIAGNOSIS — R600 Localized edema: Secondary | ICD-10-CM

## 2020-05-14 NOTE — Telephone Encounter (Signed)
Please let the patient know that she does appear to be anemic.  This can at times contribute to swelling.  Please see if she has had any blood in her stool.  Please see how heavy her menstrual cycles are.  She will need additional lab work to determine if she is iron deficient and we can recheck her CBC at that time.  Her thyroid function is acceptable.  Her kidney function is acceptable.  Her protein levels are acceptable.  Her BNP is mildly elevated.  This could indicate that she is retaining fluid.  Given the swelling in this level we should obtain an echo to evaluate further.  I have placed an order for this and somebody will contact her to get this scheduled.  Thanks.

## 2020-05-18 ENCOUNTER — Telehealth: Payer: Self-pay

## 2020-05-18 NOTE — Telephone Encounter (Signed)
Patient was given her lab results on last Friday and she understood, I informed her that the referral dept will call her to schedule her ECHO and she understood.  Labs were ordered and patient was scheduled a lab appointment.  Nina,cma

## 2020-05-19 ENCOUNTER — Telehealth: Payer: Self-pay | Admitting: Family Medicine

## 2020-05-19 DIAGNOSIS — R319 Hematuria, unspecified: Secondary | ICD-10-CM

## 2020-05-19 NOTE — Telephone Encounter (Signed)
Patient called in stated that no one has called her for echo cardio gram

## 2020-05-19 NOTE — Telephone Encounter (Signed)
It may take some time for this to get scheduled. I will forward to Rasheedah to try to get this scheduled for the patient.

## 2020-05-19 NOTE — Telephone Encounter (Signed)
Patient called in stated that no one has called her for echo cardio gram nina,cma

## 2020-05-19 NOTE — Telephone Encounter (Signed)
I called pt and called to get echo scheduled vm was left to call pt back to sch.

## 2020-05-21 ENCOUNTER — Other Ambulatory Visit: Payer: Self-pay

## 2020-05-21 ENCOUNTER — Other Ambulatory Visit: Payer: Self-pay | Admitting: Family Medicine

## 2020-05-21 ENCOUNTER — Other Ambulatory Visit: Payer: Self-pay | Admitting: *Deleted

## 2020-05-21 ENCOUNTER — Other Ambulatory Visit (INDEPENDENT_AMBULATORY_CARE_PROVIDER_SITE_OTHER): Payer: Managed Care, Other (non HMO)

## 2020-05-21 DIAGNOSIS — D649 Anemia, unspecified: Secondary | ICD-10-CM

## 2020-05-22 LAB — CBC
Hematocrit: 33.9 % — ABNORMAL LOW (ref 34.0–46.6)
Hemoglobin: 11 g/dL — ABNORMAL LOW (ref 11.1–15.9)
MCH: 28.1 pg (ref 26.6–33.0)
MCHC: 32.4 g/dL (ref 31.5–35.7)
MCV: 87 fL (ref 79–97)
Platelets: 207 10*3/uL (ref 150–450)
RBC: 3.91 x10E6/uL (ref 3.77–5.28)
RDW: 13.2 % (ref 11.7–15.4)
WBC: 6.1 10*3/uL (ref 3.4–10.8)

## 2020-05-22 LAB — IRON,TIBC AND FERRITIN PANEL
Ferritin: 7 ng/mL — ABNORMAL LOW (ref 15–150)
Iron Saturation: 7 % — CL (ref 15–55)
Iron: 36 ug/dL (ref 27–159)
Total Iron Binding Capacity: 521 ug/dL — ABNORMAL HIGH (ref 250–450)
UIBC: 485 ug/dL — ABNORMAL HIGH (ref 131–425)

## 2020-05-25 ENCOUNTER — Ambulatory Visit: Payer: Managed Care, Other (non HMO) | Admitting: Dermatology

## 2020-05-26 ENCOUNTER — Other Ambulatory Visit: Payer: Managed Care, Other (non HMO)

## 2020-05-27 ENCOUNTER — Other Ambulatory Visit (INDEPENDENT_AMBULATORY_CARE_PROVIDER_SITE_OTHER): Payer: Managed Care, Other (non HMO)

## 2020-05-27 ENCOUNTER — Other Ambulatory Visit: Payer: Self-pay

## 2020-05-27 DIAGNOSIS — D649 Anemia, unspecified: Secondary | ICD-10-CM | POA: Diagnosis not present

## 2020-05-27 LAB — POCT URINALYSIS DIP (MANUAL ENTRY)
Bilirubin, UA: NEGATIVE
Blood, UA: NEGATIVE
Glucose, UA: NEGATIVE mg/dL
Leukocytes, UA: NEGATIVE
Nitrite, UA: NEGATIVE
Protein Ur, POC: NEGATIVE mg/dL
Spec Grav, UA: 1.025 (ref 1.010–1.025)
Urobilinogen, UA: 1 E.U./dL
pH, UA: 5 (ref 5.0–8.0)

## 2020-06-01 ENCOUNTER — Other Ambulatory Visit: Payer: Self-pay

## 2020-06-01 ENCOUNTER — Ambulatory Visit
Admission: RE | Admit: 2020-06-01 | Discharge: 2020-06-01 | Disposition: A | Discharge status: Home / Self Care | Payer: Managed Care, Other (non HMO) | Source: Ambulatory Visit | Attending: Family Medicine | Admitting: Family Medicine

## 2020-06-01 DIAGNOSIS — R600 Localized edema: Secondary | ICD-10-CM | POA: Insufficient documentation

## 2020-06-01 DIAGNOSIS — I370 Nonrheumatic pulmonary valve stenosis: Secondary | ICD-10-CM | POA: Diagnosis not present

## 2020-06-01 DIAGNOSIS — I361 Nonrheumatic tricuspid (valve) insufficiency: Secondary | ICD-10-CM | POA: Diagnosis not present

## 2020-06-01 DIAGNOSIS — I071 Rheumatic tricuspid insufficiency: Secondary | ICD-10-CM | POA: Insufficient documentation

## 2020-06-01 DIAGNOSIS — I340 Nonrheumatic mitral (valve) insufficiency: Secondary | ICD-10-CM | POA: Diagnosis not present

## 2020-06-01 DIAGNOSIS — I517 Cardiomegaly: Secondary | ICD-10-CM | POA: Diagnosis not present

## 2020-06-01 DIAGNOSIS — R7989 Other specified abnormal findings of blood chemistry: Secondary | ICD-10-CM | POA: Insufficient documentation

## 2020-06-01 DIAGNOSIS — Z8616 Personal history of COVID-19: Secondary | ICD-10-CM | POA: Diagnosis not present

## 2020-06-01 DIAGNOSIS — I10 Essential (primary) hypertension: Secondary | ICD-10-CM | POA: Insufficient documentation

## 2020-06-01 LAB — ECHOCARDIOGRAM COMPLETE
AR max vel: 3.35 cm2
AV Area VTI: 3.54 cm2
AV Area mean vel: 3.05 cm2
AV Mean grad: 4 mmHg
AV Peak grad: 7.7 mmHg
Ao pk vel: 1.39 m/s
Area-P 1/2: 3.42 cm2
MV VTI: 3.01 cm2
S' Lateral: 3.3 cm

## 2020-06-01 NOTE — Progress Notes (Signed)
*  PRELIMINARY RESULTS* Echocardiogram 2D Echocardiogram has been performed.  Jill Hawkins 06/01/2020, 11:37 AM

## 2020-06-07 ENCOUNTER — Ambulatory Visit: Payer: Managed Care, Other (non HMO) | Admitting: Dermatology

## 2020-06-07 ENCOUNTER — Other Ambulatory Visit: Payer: Self-pay | Admitting: Family Medicine

## 2020-06-07 ENCOUNTER — Other Ambulatory Visit: Payer: Self-pay

## 2020-06-07 DIAGNOSIS — L309 Dermatitis, unspecified: Secondary | ICD-10-CM | POA: Diagnosis not present

## 2020-06-07 DIAGNOSIS — R931 Abnormal findings on diagnostic imaging of heart and coronary circulation: Secondary | ICD-10-CM

## 2020-06-07 DIAGNOSIS — L304 Erythema intertrigo: Secondary | ICD-10-CM

## 2020-06-07 DIAGNOSIS — D485 Neoplasm of uncertain behavior of skin: Secondary | ICD-10-CM

## 2020-06-07 MED ORDER — KETOCONAZOLE 2 % EX CREA: TOPICAL_CREAM | CUTANEOUS | 7 refills | Status: DC

## 2020-06-07 MED ORDER — HYDROCORTISONE 2.5 % EX CREA: TOPICAL_CREAM | CUTANEOUS | 3 refills | Status: DC

## 2020-06-07 MED ORDER — CLOBETASOL PROPIONATE 0.05 % EX FOAM: CUTANEOUS | 1 refills | Status: DC

## 2020-06-07 NOTE — Progress Notes (Signed)
Follow-Up Visit   Subjective  Jill Hawkins is a 45 y.o. female who presents for the following: Follow-up (Patient here today for hand dermatitis follow up. She has used clobetasol in the past and is currently using Eucrisa twice daily. Patient advises her hands have improved. Patient also to have skin tags removed at neck. ).  Patient previously prescribed ketoconazole and HC cream to treat rash at body folds. Patient had bariatric surgery in 2015. Rash at body folds where the skin is loose worsens during the summer with the heat, up to 10 break outs throughout the year. Patient would like refills for creams and instructions on how to use them.   The following portions of the chart were reviewed this encounter and updated as appropriate:   Tobacco  Allergies  Meds  Problems  Med Hx  Surg Hx  Fam Hx      Review of Systems:  No other skin or systemic complaints except as noted in HPI or Assessment and Plan.  Objective  Well appearing patient in no apparent distress; mood and affect are within normal limits.  A focused examination was performed including hands, neck, abdomen. Relevant physical exam findings are noted in the Assessment and Plan.  Objective  bilateral hands: Scaly pink plaques   Objective  left base of neck: 0.3cm erythematous tan papule   Objective  left anterior neck: 0.2cm erythematous tan papule  Objective  right anterior neck: 0.2cm erythematous tan papule  Objective  Left Inguinal Area: Erythematous patch low abdomen   Assessment & Plan  Hand dermatitis bilateral hands  Chronic condition with duration or expected duration over one year. Condition is bothersome to patient. Not currently at goal.  Continue Eucrisa to milder areas twice daily. Start clobetasol 0.05% foam twice daily up to 1 week then weekends only. Avoid applying to face, groin, and axilla. Use as directed. Risk of skin atrophy with long-term use reviewed.   Topical  steroids (such as triamcinolone, fluocinolone, fluocinonide, mometasone, clobetasol, halobetasol, betamethasone, hydrocortisone) can cause thinning and lightening of the skin if they are used for too long in the same area. Your physician has selected the right strength medicine for your problem and area affected on the body. Please use your medication only as directed by your physician to prevent side effects.    Ordered Medications: clobetasol (OLUX) 0.05 % topical foam  Neoplasm of uncertain behavior of skin (3) left base of neck  Epidermal / dermal shaving  Lesion diameter (cm):  0.3 Informed consent: discussed and consent obtained   Timeout: patient name, date of birth, surgical site, and procedure verified   Patient was prepped and draped in usual sterile fashion: area prepped with isopropyl alcohol. Anesthesia: the lesion was anesthetized in a standard fashion   Instrument used: flexible razor blade   Hemostasis achieved with: aluminum chloride   Outcome: patient tolerated procedure well   Post-procedure details: wound care instructions given   Additional details:  Mupirocin and a bandage applied  Specimen 1 - Surgical pathology Differential Diagnosis: R/o Irritated Acrochordon vs ISK vs Other  Check Margins: No 0.3cm erythematous tan papule   left anterior neck  Epidermal / dermal shaving  Lesion diameter (cm):  0.2 Informed consent: discussed and consent obtained   Timeout: patient name, date of birth, surgical site, and procedure verified   Patient was prepped and draped in usual sterile fashion: area prepped with isopropyl alcohol. Anesthesia: the lesion was anesthetized in a standard fashion   Instrument  used: flexible razor blade   Hemostasis achieved with: aluminum chloride   Outcome: patient tolerated procedure well   Post-procedure details: wound care instructions given   Additional details:  Mupirocin and a bandage applied  Specimen 2 - Surgical  pathology Differential Diagnosis: R/o Irritated Acrochordon vs ISK vs Other  Check Margins: No 0.2cm erythematous tan papule  right anterior neck  Epidermal / dermal shaving  Informed consent: discussed and consent obtained   Timeout: patient name, date of birth, surgical site, and procedure verified   Patient was prepped and draped in usual sterile fashion: area prepped with isopropyl alcohol. 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 She would benefit from skin removal if possible since this recurs so much throughout the year (approximately 10 break outs per year).  Restart ketoconazole 2% vream BID PRN Restart HC 2.5% cream BID PRN for up to 1 week. Avoid applying to face, groin, and axilla. Use as directed. Risk of skin atrophy with long-term use reviewed.   Topical steroids (such as triamcinolone, fluocinolone, fluocinonide, mometasone, clobetasol, halobetasol, betamethasone, hydrocortisone) can cause thinning and lightening of the skin if they are used for too long in the same area. Your physician has selected the right strength medicine for your problem and area affected on the body. Please use your medication only as directed by your physician to prevent side effects.    Reordered Medications ketoconazole (NIZORAL) 2 % cream hydrocortisone 2.5 % cream  Return in about 3 months (around 09/06/2020) for TBSE.  Graciella Belton, RMA, am  acting as scribe for Forest Gleason, MD .  Documentation: I have reviewed the above documentation for accuracy and completeness, and I agree with the above.  Forest Gleason, MD

## 2020-06-07 NOTE — Patient Instructions (Addendum)
For rash at body folds -  Restart Ketoconazole cream 2% (antifungal) twice daily as needed to affected areas.  Restart Hydrocortisone cream 2.5% twice daily as needed to affected area up to 1 week, can repeat in 1 month if needed. For prevention, recommend OTC antifungal powder.   Wound Care Instructions  1. Cleanse wound gently with soap and water once a day then pat dry with clean gauze. Apply a thing coat of Petrolatum (petroleum jelly, "Vaseline") over the wound (unless you have an allergy to this). We recommend that you use a new, sterile tube of Vaseline. Do not pick or remove scabs. Do not remove the yellow or white "healing tissue" from the base of the wound.  2. Cover the wound with fresh, clean, nonstick gauze and secure with paper tape. You may use Band-Aids in place of gauze and tape if the would is small enough, but would recommend trimming much of the tape off as there is often too much. Sometimes Band-Aids can irritate the skin.  3. You should call the office for your biopsy report after 1 week if you have not already been contacted.  4. If you experience any problems, such as abnormal amounts of bleeding, swelling, significant bruising, significant pain, or evidence of infection, please call the office immediately.   Recommend taking Heliocare sun protection supplement daily in sunny weather for additional sun protection. For maximum protection on the sunniest days, you can take up to 2 capsules of regular Heliocare OR take 1 capsule of Heliocare Ultra. For prolonged exposure (such as a full day in the sun), you can repeat your dose of the supplement 4 hours after your first dose. Heliocare can be purchased at [facility] or at [email or web address].    Recommend daily broad spectrum sunscreen SPF 30+ to sun-exposed areas, reapply every 2 hours as needed. Call for new or changing lesions.  Staying in the shade or wearing long sleeves, sun glasses (UVA+UVB protection) and  wide brim hats (4-inch brim around the entire circumference of the hat) are also recommended for sun protection.   Some Recommended Sunscreens Include:  Good for Daily Wear (feels like lotion but NOT sweat resistant) Cerave AM Moisturizer with SPF EltaMD UV Lotion  Body or All Over Sunscreen EltaMD UV active for body and face Blue lizard sensitive Sun bum mineral (avoid if sensitive to scent) Aveeno Positively Mineral Neutrogena sheer zinc (Slightly harder to rub in) [employer] clear zinc (Slightly harder to rub in)  Clear Face Sunscreen EltaMD UV Elements CeraVe hydrating sunscreen 50 face  Tinted Face Sunscreen Alastin Hydratint (good for most skin tones, may be slightly dark if you are very fair) Colorescience Sunforgettable Total Protection Face Shield (good for most skin tones) EltaMD UV Physical La Roche Posay Mineral Tinted Cotz Flawless Complexion   Powder Sunscreen (Nice for reapplying or applying on the go) Colorescience Sunforgettable Total Protection Brush on Shield (available in different tints)  Face Sunscreen Available in Different Tints Colorescience Sunforgettable Total Protection Brush on Shield  bareMinerals Complexion Rescue Tinted Hydrating Gel Cream Broad Spectrum SPF 30 UnSun mineral tinted (comes in medium/dark and light/medium)  Kids (over 6 months) - Mineral Sunscreens Recommended eltaMD UV Pure MDsolarSciences KidStick 40 SPF Aveeno Baby Continuous Protection Sensitive Zinc Oxide Blue Lizard Kids mineral based sunscreen lotion Mustela Mineral Sunscreen for face and body Neutrogena Sheer Zinc Kids Sunscreen Stick  Tinted to look like a tan PCAskin sheer tint body spray

## 2020-06-08 ENCOUNTER — Encounter: Payer: Self-pay | Admitting: Family Medicine

## 2020-06-09 ENCOUNTER — Telehealth: Payer: Self-pay

## 2020-06-09 NOTE — Telephone Encounter (Signed)
-----   Message from Alfonso Patten, MD sent at 06/09/2020 10:36 AM EDT ----- 1. Skin , left base of neck, shave FIBROEPITHELIAL POLYP 2. Skin , left anterior neck, shave FIBROEPITHELIAL POLYP 3. Skin , right anterior neck, shave FIBROEPITHELIAL POLYP  Skin tags, benign, no additional treatment needed.

## 2020-06-09 NOTE — Telephone Encounter (Signed)
Left patient message advising bx results benign, JS

## 2020-06-14 ENCOUNTER — Encounter: Payer: Self-pay | Admitting: Dermatology

## 2020-08-13 ENCOUNTER — Other Ambulatory Visit: Payer: Self-pay | Admitting: Family Medicine

## 2020-08-13 DIAGNOSIS — J309 Allergic rhinitis, unspecified: Secondary | ICD-10-CM

## 2020-08-16 ENCOUNTER — Ambulatory Visit: Payer: Managed Care, Other (non HMO) | Admitting: Family Medicine

## 2020-08-23 ENCOUNTER — Encounter: Payer: Self-pay | Admitting: Family Medicine

## 2020-08-23 ENCOUNTER — Ambulatory Visit: Payer: Managed Care, Other (non HMO) | Admitting: Family Medicine

## 2020-08-23 ENCOUNTER — Other Ambulatory Visit: Payer: Self-pay

## 2020-08-23 VITALS — BP 120/70 | HR 57 | Temp 98.0°F | Ht 67.0 in | Wt 236.0 lb

## 2020-08-23 DIAGNOSIS — R002 Palpitations: Secondary | ICD-10-CM | POA: Diagnosis not present

## 2020-08-23 DIAGNOSIS — L731 Pseudofolliculitis barbae: Secondary | ICD-10-CM | POA: Diagnosis not present

## 2020-08-23 DIAGNOSIS — R350 Frequency of micturition: Secondary | ICD-10-CM

## 2020-08-23 DIAGNOSIS — D509 Iron deficiency anemia, unspecified: Secondary | ICD-10-CM | POA: Diagnosis not present

## 2020-08-23 LAB — POCT URINALYSIS DIPSTICK
Bilirubin, UA: NEGATIVE
Blood, UA: NEGATIVE
Glucose, UA: NEGATIVE
Ketones, UA: NEGATIVE
Leukocytes, UA: NEGATIVE
Nitrite, UA: NEGATIVE
Protein, UA: NEGATIVE
Spec Grav, UA: 1.02 (ref 1.010–1.025)
Urobilinogen, UA: 0.2 E.U./dL
pH, UA: 5 (ref 5.0–8.0)

## 2020-08-23 NOTE — Progress Notes (Signed)
[name], MD Phone: [phone]  Jill Hawkins is a 45 y.o. female who presents today for f/u.  Iron deficiency anemia: Patient has seen GI.  The plan on doing a colonoscopy and EGD.  She is scheduled for October.  She is currently on an iron supplement.  Tissue transglutaminase level was negative.  Urinary frequency: Patient has noticed this over the last 3 weeks.  She is urinating 2 times per hour.  Some urgency.  No dysuria.  No hematuria.  No abdominal pain.  Vaginal nodule: Patient notes an external vaginal nodule that she noticed yesterday.  It is a little tender.  No drainage.  She had something similar last year and was treated with an antibiotic with good resolution.  Palpitations: The patient recently saw cardiology and had stress test as well as a CT coronary study and was advised that her heart was okay.  She also had a sleep study that she reports did not reveal sleep apnea.  They did place her on fluid pill.  She reports feeling palpitations at times when she lays down.  She feels that her heart rate is higher than it should be.  She notes they did not do a heart monitor.  Social History   Tobacco Use  Smoking Status Never  Smokeless Tobacco Never    Current Outpatient Medications on File Prior to Visit  Medication Sig Dispense Refill   ALPRAZolam (XANAX) 1 MG tablet Take 1 tablet (1 mg total) by mouth daily as needed for anxiety. 30 tablet 0   B Complex Vitamins (VITAMIN-B COMPLEX) TABS Take by mouth.     benzonatate (TESSALON) 200 MG capsule Take 1 capsule (200 mg total) by mouth 3 (three) times daily as needed for cough. 30 capsule 0   blood glucose meter kit and supplies KIT Dispense based on patient and insurance preference. Check once daily as needed for hypoglycemic symptoms. Dx hypoglycemic symptoms. 1 each 0   chlorpheniramine-HYDROcodone (TUSSIONEX PENNKINETIC ER) 10-8 MG/5ML SUER Take 5 mLs by mouth every 12 (twelve) hours as needed for cough. 140 mL 0    clobetasol (OLUX) 0.05 % topical foam Apply to hands twice daily for up to 1 week then weekends only. Avoid applying to face, groin, and axilla. Use as directed. Risk of skin atrophy with long-term use reviewed. 50 g 1   Clobetasol Prop Emollient Base (CLOBETASOL PROPIONATE E) 0.05 % emollient cream Apply to skin once or twice a day as needed, avoid face, groin, axilla 30 g 0   Continuous Blood Gluc Receiver (DEXCOM G6 RECEIVER) DEVI Use 1 receiver to check glucose every 6 hours.  Diagnosis code E16.2 1 each 0   Continuous Blood Gluc Receiver (FREESTYLE LIBRE 14 DAY READER) DEVI Use to check sugar 4 times daily 1 each 0   Continuous Blood Gluc Sensor (FREESTYLE LIBRE 14 DAY SENSOR) MISC Apply once every 14 days. 6 each 1   Continuous Blood Gluc Transmit (DEXCOM G6 TRANSMITTER) MISC Use 1 transmitter to check glucose every 6 hours.  Diagnosis code E16.2 1 each 0   Crisaborole (EUCRISA) 2 % OINT Apply 1 application topically in the morning and at bedtime. Apply to affected areas of hands 60 g 11   cyclobenzaprine (FLEXERIL) 10 MG tablet Take 10 mg by mouth 3 (three) times daily as needed for muscle spasms.     DULoxetine (CYMBALTA) 30 MG capsule      DULoxetine (CYMBALTA) 30 MG capsule TAKE ONE CAPSULE BY MOUTH DAILY FOR 21 DAYS  THEN TAKE TWO CAPSULES DAILY 180 capsule 1   etanercept (ENBREL SURECLICK) 50 MG/ML injection Inject 50 mLs into the skin once a week. On friday     fexofenadine (ALLEGRA) 180 MG tablet TAKE ONE TABLET BY MOUTH DAILY 90 tablet 1   fluticasone (FLONASE) 50 MCG/ACT nasal spray Place 2 sprays into both nostrils daily. 16 g 6   folic acid (FOLVITE) 1 MG tablet TAKE ONE TABLET BY MOUTH DAILY 90 tablet 0   hydrocortisone (ANUSOL-HC) 2.5 % rectal cream Place 1 application rectally 2 (two) times daily. 30 g 0   hydrocortisone 2.5 % cream Apply to affected areas twice daily as needed for up to 1 week. Can repeat in a month if needed 28 g 3   ketoconazole (NIZORAL) 2 % cream Apply to  affected area twice daily as needed 30 g 7   levonorgestrel (LILETTA) 18.6 MCG/DAY IUD IUD by Intrauterine route.     methocarbamol (ROBAXIN) 750 MG tablet Take 750 mg by mouth 3 (three) times daily.      montelukast (SINGULAIR) 10 MG tablet TAKE ONE TABLET BY MOUTH EVERY NIGHT AT BEDTIME 30 tablet 2   Multiple Vitamin (MULTI-VITAMINS) TABS Take by mouth.     nystatin-triamcinolone ointment (MYCOLOG) Apply 1 application topically 2 (two) times daily. 30 g 0   ondansetron (ZOFRAN-ODT) 4 MG disintegrating tablet      Pancrelipase, Lip-Prot-Amyl, (ZENPEP) 40000-126000 units CPEP Take by mouth. Take 2 capsules by mouth 4 times a day with meals and nightly for 180 days . Take 2 capsules with meals and 1 with snacks     sulfamethoxazole-trimethoprim (BACTRIM DS) 800-160 MG tablet Take 1 tablet by mouth 2 (two) times daily. 14 tablet 0   gabapentin (NEURONTIN) 300 MG capsule Take 300 mg by mouth 3 (three) times daily.     omeprazole (PRILOSEC) 20 MG capsule Take by mouth.     spironolactone (ALDACTONE) 25 MG tablet Take 25 mg by mouth daily.     [DISCONTINUED] buPROPion (WELLBUTRIN) 100 MG tablet TAKE ONE TABLET BY MOUTH THREE TIMES A DAY 90 tablet 0   No current facility-administered medications on file prior to visit.     ROS see history of present illness  Objective  Physical Exam Vitals:   08/23/20 1603  BP: 120/70  Pulse: (!) 57  Temp: 98 F (36.7 C)  SpO2: 97%    BP Readings from Last 3 Encounters:  08/23/20 120/70  05/10/20 130/80  02/23/20 135/79   Wt Readings from Last 3 Encounters:  08/23/20 236 lb (107 kg)  05/10/20 251 lb (113.9 kg)  02/24/20 245 lb (111.1 kg)    Physical Exam Constitutional:      General: She is not in acute distress.    Appearance: She is not diaphoretic.  Cardiovascular:     Rate and Rhythm: Normal rate and regular rhythm.     Heart sounds: Normal heart sounds.  Pulmonary:     Effort: Pulmonary effort is normal.     Breath sounds: Normal  breath sounds.  Skin:    General: Skin is warm and dry.          Comments: Fulton Mole, CMA served as chaperone  Neurological:     Mental Status: She is alert.     Assessment/Plan: Please see individual problem list.  Problem List Items Addressed This Visit     IDA (iron deficiency anemia)    She will complete her evaluation through GI.  We will recheck her iron  studies and CBC.       Relevant Orders   IBC + Ferritin   CBC   Ingrown hair    The area of concern is consistent with an ingrown hair.  I discussed warm compresses.  If she has worsening pain or enlargement of the nodule she will contact us to consider an antibiotic.       Palpitations    Recent TSH normal.  We are rechecking CBC and BMP.  She will contact her cardiologist to see if they can arrange for a heart monitor.       Urinary frequency    Urinalysis was negative for signs of infection.  This may be related to the "fluid pill" she is now on.  Discussed checking renal function as well as screening for diabetes.       Relevant Orders   Basic Metabolic Panel (BMET)   HgB A1c   Other Visit Diagnoses     Urine frequency    -  Primary   Relevant Orders   POCT Urinalysis Dipstick (Completed)       Return in about 4 months (around 12/23/2020).  This visit occurred during the SARS-CoV-2 public health emergency.  Safety protocols were in place, including screening questions prior to the visit, additional usage of staff PPE, and extensive cleaning of exam room while observing appropriate contact time as indicated for disinfecting solutions.   I have spent 31 minutes in the care of this patient regarding history taking, documentation, completion of exam, discussion of plan, placing orders, review of recent GI.   [name], MD [place]

## 2020-08-23 NOTE — Assessment & Plan Note (Signed)
Urinalysis was negative for signs of infection.  This may be related to the "fluid pill" she is now on.  Discussed checking renal function as well as screening for diabetes.

## 2020-08-23 NOTE — Patient Instructions (Signed)
Nice to see you. We will get labs today and contact you with the results. Please use warm compresses on the area in your suprapubic region.  If the area enlarges or you develop worsening pain please let me know and we could consider an antibiotic at that point.

## 2020-08-23 NOTE — Assessment & Plan Note (Signed)
Recent TSH normal.  We are rechecking CBC and BMP.  She will contact her cardiologist to see if they can arrange for a heart monitor.

## 2020-08-23 NOTE — Assessment & Plan Note (Signed)
The area of concern is consistent with an ingrown hair.  I discussed warm compresses.  If she has worsening pain or enlargement of the nodule she will contact us to consider an antibiotic.

## 2020-08-23 NOTE — Assessment & Plan Note (Signed)
She will complete her evaluation through GI.  We will recheck her iron studies and CBC.

## 2020-08-24 LAB — CBC
HCT: 33.8 % — ABNORMAL LOW (ref 36.0–46.0)
Hemoglobin: 11.1 g/dL — ABNORMAL LOW (ref 12.0–15.0)
MCHC: 32.7 g/dL (ref 30.0–36.0)
MCV: 83.1 fl (ref 78.0–100.0)
Platelets: 198 10*3/uL (ref 150.0–400.0)
RBC: 4.08 Mil/uL (ref 3.87–5.11)
RDW: 15.3 % (ref 11.5–15.5)
WBC: 4.6 10*3/uL (ref 4.0–10.5)

## 2020-08-24 LAB — BASIC METABOLIC PANEL
BUN: 11 mg/dL (ref 6–23)
CO2: 25 mEq/L (ref 19–32)
Calcium: 9.4 mg/dL (ref 8.4–10.5)
Chloride: 104 mEq/L (ref 96–112)
Creatinine, Ser: 0.51 mg/dL (ref 0.40–1.20)
GFR: 114.45 mL/min (ref >60.00)
Glucose, Bld: 84 mg/dL (ref 70–99)
Potassium: 3.7 mEq/L (ref 3.5–5.1)
Sodium: 137 mEq/L (ref 135–145)

## 2020-08-24 LAB — IBC + FERRITIN
Ferritin: 5.7 ng/mL — ABNORMAL LOW (ref 10.0–291.0)
Iron: 43 ug/dL (ref 42–145)
Saturation Ratios: 6.5 % — ABNORMAL LOW (ref 20.0–50.0)
Transferrin: 474 mg/dL — ABNORMAL HIGH (ref 212.0–360.0)

## 2020-08-24 LAB — HEMOGLOBIN A1C: Hgb A1c MFr Bld: 5 % (ref 4.6–6.5)

## 2020-08-27 NOTE — Progress Notes (Signed)
Left message for patient to return call back for lab results.

## 2020-09-01 ENCOUNTER — Ambulatory Visit: Payer: Managed Care, Other (non HMO) | Admitting: Dermatology

## 2020-10-12 ENCOUNTER — Encounter: Payer: Self-pay | Admitting: Family Medicine

## 2020-10-13 MED ORDER — ALPRAZOLAM 1 MG PO TABS: 1.0000 mg | ORAL_TABLET | Freq: Every day | ORAL | 0 refills | Status: DC | PRN

## 2020-10-13 MED ORDER — SCOPOLAMINE 1 MG/3DAYS TD PT72: 1.0000 | MEDICATED_PATCH | TRANSDERMAL | 0 refills | Status: DC

## 2020-12-13 ENCOUNTER — Encounter: Payer: Self-pay | Admitting: Family Medicine

## 2020-12-14 NOTE — Telephone Encounter (Signed)
Please create a handicap placard form for her so I can sign it.

## 2020-12-15 NOTE — Telephone Encounter (Signed)
Form signed. Please make available to pick up.

## 2020-12-21 ENCOUNTER — Ambulatory Visit: Payer: Managed Care, Other (non HMO) | Admitting: Family Medicine

## 2021-02-02 ENCOUNTER — Other Ambulatory Visit: Payer: Self-pay

## 2021-02-02 ENCOUNTER — Ambulatory Visit (INDEPENDENT_AMBULATORY_CARE_PROVIDER_SITE_OTHER): Payer: BC Managed Care – PPO | Admitting: Family Medicine

## 2021-02-02 ENCOUNTER — Encounter: Payer: Self-pay | Admitting: Family Medicine

## 2021-02-02 VITALS — BP 125/70 | HR 72 | Temp 98.0°F | Ht 67.0 in | Wt 250.4 lb

## 2021-02-02 DIAGNOSIS — D509 Iron deficiency anemia, unspecified: Secondary | ICD-10-CM

## 2021-02-02 DIAGNOSIS — R350 Frequency of micturition: Secondary | ICD-10-CM

## 2021-02-02 DIAGNOSIS — F419 Anxiety disorder, unspecified: Secondary | ICD-10-CM | POA: Diagnosis not present

## 2021-02-02 DIAGNOSIS — G8929 Other chronic pain: Secondary | ICD-10-CM

## 2021-02-02 DIAGNOSIS — M25511 Pain in right shoulder: Secondary | ICD-10-CM

## 2021-02-02 DIAGNOSIS — N3281 Overactive bladder: Secondary | ICD-10-CM

## 2021-02-02 DIAGNOSIS — M545 Low back pain, unspecified: Secondary | ICD-10-CM | POA: Diagnosis not present

## 2021-02-02 DIAGNOSIS — F32A Depression, unspecified: Secondary | ICD-10-CM

## 2021-02-02 LAB — POCT URINALYSIS DIPSTICK
Bilirubin, UA: NEGATIVE
Blood, UA: NEGATIVE
Glucose, UA: NEGATIVE
Ketones, UA: NEGATIVE
Nitrite, UA: NEGATIVE
Protein, UA: NEGATIVE
Spec Grav, UA: 1.015 (ref 1.010–1.025)
Urobilinogen, UA: 0.2 E.U./dL
pH, UA: 6 (ref 5.0–8.0)

## 2021-02-02 MED ORDER — DULOXETINE HCL 30 MG PO CPEP: 30.0000 mg | ORAL_CAPSULE | Freq: Every day | ORAL | 0 refills | Status: DC

## 2021-02-02 MED ORDER — VENLAFAXINE HCL ER 37.5 MG PO CP24: ORAL_CAPSULE | ORAL | 1 refills | Status: DC

## 2021-02-02 MED ORDER — GEMTESA 75 MG PO TABS: 75.0000 mg | ORAL_TABLET | Freq: Every day | ORAL | 1 refills | Status: DC

## 2021-02-02 MED ORDER — METHOCARBAMOL 750 MG PO TABS: 750.0000 mg | ORAL_TABLET | Freq: Three times a day (TID) | ORAL | 1 refills | Status: DC | PRN

## 2021-02-02 NOTE — Assessment & Plan Note (Signed)
Likely related to a nerve impingement.  She will complete her EMG as scheduled.

## 2021-02-02 NOTE — Progress Notes (Signed)
Jill Rumps, MD Phone: [phone]  Jill Hawkins is a 45 y.o. female who presents today for f/u.  Iron deficiency anemia: She saw GI and has an EGD and colonoscopy scheduled later this month.  She is taking an iron supplement.  Overactive bladder: Patient notes frequency and urgency.  This has been an ongoing issue.  No blood or dysuria.  She wakes up 2-3 times a night to urinate.  Anxiety/depression: Patient notes she has lots of stress with work.  She has had more anxiety.  No SI or HI.  No significant depression.  She rarely takes the Xanax and only does it when she is traveling.  She has been on Cymbalta 30 mg twice daily.  Chronic low back pain: She was previously seeing pain management though has not seen them since the start of the pandemic.  She was prescribed Robaxin for the daytime and Flexeril for nighttime use.  She notes both of those were beneficial.  Right arm numbness and tingling: She has seen a specialist for this.  She had an MRI of her neck which she reports was negative.  They have an EMG study scheduled for January.  Social History   Tobacco Use  Smoking Status Never  Smokeless Tobacco Never    Current Outpatient Medications on File Prior to Visit  Medication Sig Dispense Refill   ALPRAZolam (XANAX) 1 MG tablet Take 1 tablet (1 mg total) by mouth daily as needed for anxiety. 15 tablet 0   B Complex Vitamins (VITAMIN-B COMPLEX) TABS Take by mouth.     chlorpheniramine-HYDROcodone (TUSSIONEX PENNKINETIC ER) 10-8 MG/5ML SUER Take 5 mLs by mouth every 12 (twelve) hours as needed for cough. 140 mL 0   clobetasol (OLUX) 0.05 % topical foam Apply to hands twice daily for up to 1 week then weekends only. Avoid applying to face, groin, and axilla. Use as directed. Risk of skin atrophy with long-term use reviewed. 50 g 1   Clobetasol Prop Emollient Base (CLOBETASOL PROPIONATE E) 0.05 % emollient cream Apply to skin once or twice a day as needed, avoid face, groin,  axilla 30 g 0   Continuous Blood Gluc Receiver (DEXCOM G6 RECEIVER) DEVI Use 1 receiver to check glucose every 6 hours.  Diagnosis code E16.2 1 each 0   Continuous Blood Gluc Receiver (FREESTYLE LIBRE 14 DAY READER) DEVI Use to check sugar 4 times daily 1 each 0   Continuous Blood Gluc Sensor (FREESTYLE LIBRE 14 DAY SENSOR) MISC Apply once every 14 days. 6 each 1   Continuous Blood Gluc Transmit (DEXCOM G6 TRANSMITTER) MISC Use 1 transmitter to check glucose every 6 hours.  Diagnosis code E16.2 1 each 0   Crisaborole (EUCRISA) 2 % OINT Apply 1 application topically in the morning and at bedtime. Apply to affected areas of hands 60 g 11   etanercept (ENBREL SURECLICK) 50 MG/ML injection Inject 50 mLs into the skin once a week. On friday     fexofenadine (ALLEGRA) 180 MG tablet TAKE ONE TABLET BY MOUTH DAILY 90 tablet 1   fluticasone (FLONASE) 50 MCG/ACT nasal spray Place 2 sprays into both nostrils daily. 16 g 6   folic acid (FOLVITE) 1 MG tablet TAKE ONE TABLET BY MOUTH DAILY 90 tablet 0   gabapentin (NEURONTIN) 300 MG capsule Take 300 mg by mouth 3 (three) times daily.     hydrocortisone (ANUSOL-HC) 2.5 % rectal cream Place 1 application rectally 2 (two) times daily. 30 g 0   hydrocortisone 2.5 %  cream Apply to affected areas twice daily as needed for up to 1 week. Can repeat in a month if needed 28 g 3   ketoconazole (NIZORAL) 2 % cream Apply to affected area twice daily as needed 30 g 7   levonorgestrel (LILETTA) 18.6 MCG/DAY IUD IUD by Intrauterine route.     montelukast (SINGULAIR) 10 MG tablet TAKE ONE TABLET BY MOUTH EVERY NIGHT AT BEDTIME 30 tablet 2   Multiple Vitamin (MULTI-VITAMINS) TABS Take by mouth.     nystatin-triamcinolone ointment (MYCOLOG) Apply 1 application topically 2 (two) times daily. 30 g 0   ondansetron (ZOFRAN-ODT) 4 MG disintegrating tablet      spironolactone (ALDACTONE) 25 MG tablet Take 25 mg by mouth daily.     omeprazole (PRILOSEC) 20 MG capsule Take by mouth.      [DISCONTINUED] buPROPion (WELLBUTRIN) 100 MG tablet TAKE ONE TABLET BY MOUTH THREE TIMES A DAY 90 tablet 0   No current facility-administered medications on file prior to visit.     ROS see history of present illness  Objective  Physical Exam Vitals:   02/02/21 1431  BP: 125/70  Pulse: 72  Temp: 98 F (36.7 C)  SpO2: 96%    BP Readings from Last 3 Encounters:  02/02/21 125/70  08/23/20 120/70  05/10/20 130/80   Wt Readings from Last 3 Encounters:  02/02/21 250 lb 6.4 oz (113.6 kg)  08/23/20 236 lb (107 kg)  05/10/20 251 lb (113.9 kg)    Physical Exam Constitutional:      General: She is not in acute distress.    Appearance: She is not diaphoretic.  Cardiovascular:     Rate and Rhythm: Normal rate and regular rhythm.     Heart sounds: Normal heart sounds.  Pulmonary:     Effort: Pulmonary effort is normal.     Breath sounds: Normal breath sounds.  Skin:    General: Skin is warm and dry.  Neurological:     Mental Status: She is alert.     Assessment/Plan: Please see individual problem list.  Problem List Items Addressed This Visit     Anxiety and depression    Uncontrolled.  We will try transitioning her from Cymbalta to Effexor.  The patient will reduce her Cymbalta to 30 mg once daily for 1 week and start on Effexor 37.5 mg daily at the same time.  At the end of 1 week she will increase the Effexor to 75 mg daily.      Relevant Medications   DULoxetine (CYMBALTA) 30 MG capsule   venlafaxine XR (EFFEXOR XR) 37.5 MG 24 hr capsule   Back pain    This is a chronic issue.  I discussed that I was fine taking over her muscle relaxer prescriptions.  Discussed that I would prefer if we only used one type of muscle relaxer and thus we will stick with Robaxin.  If this is not beneficial at night she will let me know.      Relevant Medications   methocarbamol (ROBAXIN) 750 MG tablet   IDA (iron deficiency anemia)    She will complete her work-up through GI.       Overactive bladder    Her urinalysis is not convincing for UTI.  We will start her on Gemtesa and see if that is beneficial.  If its not affordable she will let us know.      Relevant Medications   Vibegron (GEMTESA) 75 MG TABS   Right shoulder pain  Likely related to a nerve impingement.  She will complete her EMG as scheduled.      Other Visit Diagnoses     Urine frequency    -  Primary   Relevant Orders   POCT Urinalysis Dipstick (Completed)        Return in about 3 months (around 05/03/2021) for Anxiety/depression.  This visit occurred during the SARS-CoV-2 public health emergency.  Safety protocols were in place, including screening questions prior to the visit, additional usage of staff PPE, and extensive cleaning of exam room while observing appropriate contact time as indicated for disinfecting solutions.    Jill Rumps, MD [place]

## 2021-02-02 NOTE — Assessment & Plan Note (Signed)
She will complete her work-up through GI.

## 2021-02-02 NOTE — Patient Instructions (Signed)
Nice to see you. You will take Cymbalta 30 mg once daily for 7 days.  You will start on Effexor 37.5 mg once daily at the same time.  At the end of 7 days you will increase your dose of Effexor to 75 mg once daily. Please let me know if the [place] is too expensive.

## 2021-02-02 NOTE — Assessment & Plan Note (Signed)
This is a chronic issue.  I discussed that I was fine taking over her muscle relaxer prescriptions.  Discussed that I would prefer if we only used one type of muscle relaxer and thus we will stick with Robaxin.  If this is not beneficial at night she will let me know.

## 2021-02-02 NOTE — Assessment & Plan Note (Addendum)
Uncontrolled.  We will try transitioning her from Cymbalta to Effexor.  The patient will reduce her Cymbalta to 30 mg once daily for 1 week and start on Effexor 37.5 mg daily at the same time.  At the end of 1 week she will increase the Effexor to 75 mg daily.

## 2021-02-02 NOTE — Assessment & Plan Note (Signed)
Her urinalysis is not convincing for UTI.  We will start her on Gemtesa and see if that is beneficial.  If its not affordable she will let us know.

## 2021-02-23 ENCOUNTER — Other Ambulatory Visit: Payer: Self-pay

## 2021-02-23 ENCOUNTER — Telehealth: Payer: Self-pay | Admitting: Family Medicine

## 2021-02-23 ENCOUNTER — Encounter: Payer: Self-pay | Admitting: Emergency Medicine

## 2021-02-23 ENCOUNTER — Ambulatory Visit
Admission: EM | Admit: 2021-02-23 | Discharge: 2021-02-23 | Disposition: A | Discharge status: Home / Self Care | Payer: BC Managed Care – PPO | Attending: Medical Oncology | Admitting: Medical Oncology

## 2021-02-23 DIAGNOSIS — J029 Acute pharyngitis, unspecified: Secondary | ICD-10-CM

## 2021-02-23 DIAGNOSIS — R058 Other specified cough: Secondary | ICD-10-CM | POA: Diagnosis present

## 2021-02-23 DIAGNOSIS — R0981 Nasal congestion: Secondary | ICD-10-CM | POA: Insufficient documentation

## 2021-02-23 LAB — POCT RAPID STREP A (OFFICE): Rapid Strep A Screen: NEGATIVE

## 2021-02-23 LAB — POCT INFLUENZA A/B
Influenza A, POC: NEGATIVE
Influenza B, POC: NEGATIVE

## 2021-02-23 MED ORDER — FLUTICASONE PROPIONATE 50 MCG/ACT NA SUSP: 2.0000 | Freq: Every day | NASAL | 0 refills | Status: DC

## 2021-02-23 MED ORDER — BENZONATATE 100 MG PO CAPS: 100.0000 mg | ORAL_CAPSULE | Freq: Three times a day (TID) | ORAL | 0 refills | Status: DC

## 2021-02-23 NOTE — ED Triage Notes (Signed)
Pt here with sore throat, cough and congestion x 2 days. No fever.

## 2021-02-23 NOTE — Telephone Encounter (Signed)
Patient called in stated she  need appointment for today feel like she has strep throat and has bumps on back of mouth area. I advise did not have appointment for today recommend urgent care or speak to access nurse  Patient advise will go to urgent care

## 2021-02-23 NOTE — ED Provider Notes (Signed)
Roderic Pal[facility]    CSN: [id number] Arrival date & time: 02/23/21  0854      History   Chief Complaint Chief Complaint  Patient presents with   Cough   Sore Throat   Nasal Congestion    HPI Jill Hawkins is a 45 y.o. female.   HPI  Cold Symptoms: Patient reports that they have had symptoms of dry cough, sore throat and nasal congestion for the past 2 days. Symptoms are stable. They deny SOB, chest pain, fever or vomiting. They have tried sore throat OTC medication for symptoms. Coworker at work had COVID-19 but she is unsure if she has had enough exposure.    Past Medical History:  Diagnosis Date   Frequent headaches    GERD (gastroesophageal reflux disease)    History of fainting spells of unknown cause    Hypertension    Kidney stones    Migraine    UTI (lower urinary tract infection)     Patient Active Problem List   Diagnosis Date Noted   Overactive bladder 02/02/2021   IDA (iron deficiency anemia) 08/23/2020   Palpitations 08/23/2020   Ingrown hair 08/23/2020   Bright red blood per rectum 05/10/2020   Lateral epicondylitis of right elbow 05/10/2020   Bilateral leg edema 05/10/2020   Labial lesion 03/10/2020   Breast pain, right 03/10/2020   COVID-19 02/24/2020   Right hip pain 06/05/2019   Decreased sex drive 02/72/5366   Osteopenia 06/05/2019   Obesity (BMI 35.0-39.9 without comorbidity) 06/05/2019   GERD (gastroesophageal reflux disease) 11/30/2018   Skin lesion 11/30/2018   Wrist nodule 11/30/2018   Breast cancer screening 11/30/2018   Hypoglycemic disorder 07/17/2017   Anxiety and depression 05/17/2017   Left leg swelling 11/17/2016   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      Home Medications    Prior to Admission medications   Medication Sig Start Date End Date Taking? Authorizing Provider  ALPRAZolam Duanne Moron) 1 MG tablet Take 1 tablet (1 mg total) by mouth daily as needed for anxiety. 10/13/20   Leone Haven, MD  B Complex Vitamins (VITAMIN-B COMPLEX) TABS Take by mouth.    [provider]  chlorpheniramine-HYDROcodone (TUSSIONEX PENNKINETIC ER) 10-8 MG/5ML SUER Take 5 mLs by mouth every 12 (twelve) hours as needed for cough. 02/24/20   Leone Haven, MD  clobetasol (OLUX) 0.05 % topical foam Apply to hands twice daily for up to 1 week then weekends only. Avoid applying to face, groin, and axilla. Use as directed. Risk of skin atrophy with long-term use reviewed. 06/07/20   [place], [place], MD  Clobetasol Prop Emollient Base (CLOBETASOL PROPIONATE E) 0.05 % emollient cream Apply to skin once or twice a day as needed, avoid face, groin, axilla 03/22/20   Laurence Ferrari, [place], MD  Continuous Blood Gluc Receiver (  DEXCOM G6 RECEIVER) DEVI Use 1 receiver to check glucose every 6 hours.  Diagnosis code E16.2 11/11/19   Leone Haven, MD  Continuous Blood Gluc Receiver (FREESTYLE LIBRE 14 DAY READER) DEVI Use to check sugar 4 times daily 11/18/19   Leone Haven, MD  Continuous Blood Gluc Sensor (FREESTYLE LIBRE 14 DAY SENSOR) MISC Apply once every 14 days. 11/18/19   Leone Haven, MD  Continuous Blood Gluc Transmit (DEXCOM G6 TRANSMITTER) MISC Use 1 transmitter to check glucose every 6 hours.  Diagnosis code E16.2 11/11/19   Leone Haven, MD  Crisaborole (EUCRISA) 2 % OINT Apply 1 application  topically in the morning and at bedtime. Apply to affected areas of hands 04/22/20   [facility], [place], MD  DULoxetine (CYMBALTA) 30 MG capsule Take 1 capsule (30 mg total) by mouth daily for 7 days. 02/02/21 02/09/21  Leone Haven, MD  etanercept (ENBREL SURECLICK) 50 MG/ML injection Inject 50 mLs into the skin once a week. On friday 03/22/20   [provider]  fexofenadine (ALLEGRA) 180 MG tablet TAKE ONE TABLET BY MOUTH DAILY 08/13/20   Leone Haven, MD  fluticasone [facility]) 50 MCG/ACT nasal spray Place 2 sprays into both nostrils daily. 02/24/20   Leone Haven, MD  folic acid (FOLVITE) 1 MG tablet TAKE ONE TABLET BY MOUTH DAILY 10/03/19   Leone Haven, MD  gabapentin (NEURONTIN) 300 MG capsule Take 300 mg by mouth 3 (three) times daily. 08/09/20   [provider]  hydrocortisone (ANUSOL-HC) 2.5 % rectal cream Place 1 application rectally 2 (two) times daily. 07/17/17   Leone Haven, MD  hydrocortisone 2.5 % cream Apply to affected areas twice daily as needed for up to 1 week. Can repeat in a month if needed 06/07/20   [facility], [place], MD  ketoconazole (NIZORAL) 2 % cream Apply to affected area twice daily as needed 06/07/20   [facility], [place], MD  levonorgestrel (LILETTA) 18.6 MCG/DAY IUD IUD by Intrauterine route.    [provider]  methocarbamol (ROBAXIN) 750 MG tablet Take 1 tablet (750 mg total) by mouth every 8 (eight) hours as needed for muscle spasms. 02/02/21   Leone Haven, MD  montelukast (SINGULAIR) 10 MG tablet TAKE ONE TABLET BY MOUTH EVERY NIGHT AT BEDTIME 08/13/20   Leone Haven, MD  Multiple Vitamin (MULTI-VITAMINS) TABS Take by mouth.    [provider]  nystatin-triamcinolone ointment (MYCOLOG) Apply 1 application topically 2 (two) times daily. 07/17/17   Leone Haven, MD  omeprazole (PRILOSEC) 20 MG capsule Take by mouth. 11/06/18 12/06/18  [provider]  ondansetron (ZOFRAN-ODT) 4 MG disintegrating  tablet  04/27/16   [provider]  spironolactone (ALDACTONE) 25 MG tablet Take 25 mg by mouth daily. 08/02/20   [provider]  venlafaxine XR (EFFEXOR XR) 37.5 MG 24 hr capsule Take 1 capsule (37.5 mg total) by mouth daily with breakfast for 7 days, THEN 2 capsules (75 mg total) daily with breakfast for 7 days. 02/02/21 02/16/21  Leone Haven, MD  Vibegron (GEMTESA) 75 MG TABS Take 75 mg by mouth daily. 02/02/21   Leone Haven, MD  buPROPion [facility]) 100 MG tablet TAKE ONE TABLET BY MOUTH THREE TIMES A DAY 10/03/19 02/23/20  Leone Haven, MD    Family History Family History  Problem Relation Age of Onset   Arthritis Mother    Hypertension Mother    Mental illness Mother    Diabetes Mother  Alcohol abuse Father    Cancer Father    Hypertension Father    Mental illness Brother    Heart disease Maternal Grandmother    Hypertension Maternal Grandmother    Mental illness Maternal Grandmother    Diabetes Maternal Grandmother    Cancer Maternal Grandfather    Hypertension Maternal Grandfather    Mental illness Paternal Grandmother     Social History Social History   Tobacco Use   Smoking status: Never   Smokeless tobacco: Never  Substance Use Topics   Alcohol use: No    Alcohol/week: 0.0 standard drinks   Drug use: No     Allergies   Penicillins, Oxycodone-acetaminophen, Tramadol, Codeine, and Tape   Review of Systems Review of Systems  As stated above in HPI Physical Exam Triage Vital Signs ED Triage Vitals  Enc Vitals Group     BP 02/23/21 1004 140/84     Pulse Rate 02/23/21 1004 61     Resp 02/23/21 1004 18     Temp 02/23/21 1004 98.6 F (37 C)     Temp src --      SpO2 02/23/21 1004 98 %     Weight --      Height --      Head Circumference --      Peak Flow --      Pain Score 02/23/21 1005 6     Pain Loc --      Pain Edu? --      Excl. in [place]? --    No data found.  Updated Vital Signs BP 140/84    Pulse 61     Temp 98.6 F (37 C)    Resp 18    SpO2 98%   Physical Exam Vitals and nursing note reviewed.  Constitutional:      General: She is not in acute distress.    Appearance: Normal appearance. She is not ill-appearing, toxic-appearing or diaphoretic.  HENT:     Head: Normocephalic and atraumatic.     Right Ear: Tympanic membrane normal.     Left Ear: Tympanic membrane normal.     Nose: Congestion and rhinorrhea present.     Mouth/Throat:     Mouth: Mucous membranes are moist.     Pharynx: Oropharynx is clear. No oropharyngeal exudate or posterior oropharyngeal erythema.  Eyes:     General:        Right eye: No discharge.        Left eye: No discharge.     Extraocular Movements: Extraocular movements intact.     Conjunctiva/sclera: Conjunctivae normal.     Pupils: Pupils are equal, round, and reactive to light.  Cardiovascular:     Rate and Rhythm: Normal rate and regular rhythm.     Heart sounds: Normal heart sounds.  Pulmonary:     Effort: Pulmonary effort is normal. No respiratory distress.     Breath sounds: Normal breath sounds. No stridor. No wheezing or rhonchi.  Abdominal:     Palpations: Abdomen is soft.  Musculoskeletal:     Cervical back: Normal range of motion and neck supple.  Lymphadenopathy:     Cervical: No cervical adenopathy.  Skin:    General: Skin is warm.     Coloration: Skin is not jaundiced.     Findings: No erythema or rash.  Neurological:     Mental Status: She is alert and oriented to person, place, and time.     UC Treatments / Results  Labs (  all labs ordered are listed, but only abnormal results are displayed) Labs Reviewed  POCT INFLUENZA A/B - Normal  POCT RAPID STREP A (OFFICE) - Normal  CULTURE, GROUP A STREP ([place])  NOVEL CORONAVIRUS, NAA    EKG   Radiology No results found.  Procedures Procedures (including critical care time)  Medications Ordered in UC Medications - No data to display  Initial Impression / Assessment and  Plan / UC Course  I have reviewed the triage vital signs and the nursing notes.  Pertinent labs & imaging results that were available during my care of the patient were reviewed by me and considered in my medical decision making (see chart for details).     New. Appears viral in nature. For now rest, hydration with water, flonase and tessalon. Follow up PRN.  Final Clinical Impressions(s) / UC Diagnoses   Final diagnoses:  Sore throat   Discharge Instructions   None    ED Prescriptions   None    PDMP not reviewed this encounter.   Hughie Closs, [place] 02/23/21 1031

## 2021-02-24 ENCOUNTER — Telehealth: Payer: Self-pay

## 2021-02-24 LAB — SARS-COV-2, NAA 2 DAY TAT

## 2021-02-24 LAB — NOVEL CORONAVIRUS, NAA: SARS-CoV-2, NAA: NOT DETECTED

## 2021-02-24 NOTE — Telephone Encounter (Signed)
A PA was done on the medication Gemetsa and it was denied.  I received an appeal form and I filled it out and faxed it to [employer] awaiting a response.  Faige Seely,cma

## 2021-02-25 LAB — CULTURE, GROUP A STREP (THRC): Special Requests: NORMAL

## 2021-03-09 ENCOUNTER — Other Ambulatory Visit: Payer: Self-pay | Admitting: Family Medicine

## 2021-03-21 ENCOUNTER — Encounter: Payer: Self-pay | Admitting: Family Medicine

## 2021-04-28 ENCOUNTER — Encounter: Payer: Self-pay | Admitting: Family Medicine

## 2021-04-28 NOTE — Telephone Encounter (Signed)
Pt called stating she has severe stomach pain, [place] and diarrhea. Pt also has tenderness and pain in the right lower side of her back. Sent to access nurse ?

## 2021-04-29 ENCOUNTER — Ambulatory Visit (INDEPENDENT_AMBULATORY_CARE_PROVIDER_SITE_OTHER): Payer: BC Managed Care – PPO | Admitting: Internal Medicine

## 2021-04-29 ENCOUNTER — Ambulatory Visit
Admission: RE | Admit: 2021-04-29 | Discharge: 2021-04-29 | Disposition: A | Discharge status: Home / Self Care | Payer: BC Managed Care – PPO | Source: Ambulatory Visit | Attending: Internal Medicine | Admitting: Internal Medicine

## 2021-04-29 ENCOUNTER — Encounter: Payer: Self-pay | Admitting: Internal Medicine

## 2021-04-29 ENCOUNTER — Other Ambulatory Visit: Payer: Self-pay

## 2021-04-29 VITALS — BP 124/88 | HR 68 | Temp 97.8°F | Ht 67.0 in | Wt 259.4 lb

## 2021-04-29 DIAGNOSIS — Z87442 Personal history of urinary calculi: Secondary | ICD-10-CM | POA: Diagnosis present

## 2021-04-29 DIAGNOSIS — M545 Low back pain, unspecified: Secondary | ICD-10-CM

## 2021-04-29 DIAGNOSIS — R1084 Generalized abdominal pain: Secondary | ICD-10-CM | POA: Diagnosis present

## 2021-04-29 DIAGNOSIS — R110 Nausea: Secondary | ICD-10-CM

## 2021-04-29 DIAGNOSIS — E538 Deficiency of other specified B group vitamins: Secondary | ICD-10-CM

## 2021-04-29 DIAGNOSIS — E559 Vitamin D deficiency, unspecified: Secondary | ICD-10-CM | POA: Diagnosis not present

## 2021-04-29 DIAGNOSIS — R1013 Epigastric pain: Secondary | ICD-10-CM

## 2021-04-29 MED ORDER — OXYCODONE-ACETAMINOPHEN 5-325 MG PO TABS: 1.0000 | ORAL_TABLET | Freq: Two times a day (BID) | ORAL | 0 refills | Status: AC | PRN

## 2021-04-29 MED ORDER — PROMETHAZINE HCL 25 MG PO TABS: 25.0000 mg | ORAL_TABLET | Freq: Three times a day (TID) | ORAL | 0 refills | Status: DC | PRN

## 2021-04-29 NOTE — Progress Notes (Addendum)
Chief Complaint  Patient presents with   Abdominal Pain   Nausea   Acute visit  1. Mid abdomen pain 6/10 worse after eating s/p GB removal with h/o kidney stones. She has nausea. She is also having right lower back pain and pains happen at the same time. Pain started tues/weds sharpness comes and goes tenderness and cramps otherwise constant. Resolved diarrhea Tuesday none since then and son and husband not sick weds work up 7 am and 10 am had soup, appetite less and food made ab pain worse. Weds missed work resume on Thursday. Tried zofran w/o relief also had [employer] lima beans and corn but not able to eat fully.    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  HENT:  Negative for hearing loss.   Eyes:  Negative for blurred vision.  Respiratory:  Negative for shortness of breath.   Cardiovascular:  Negative for chest pain.  Gastrointestinal:  Positive for abdominal pain, diarrhea, nausea and vomiting. Negative for blood in stool.  Genitourinary:  Negative for dysuria.  Musculoskeletal:  Negative for falls and joint pain.  Skin:  Negative for rash.  Neurological:  Negative for headaches.  Psychiatric/Behavioral:  Negative for depression.   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   GROWTH PLATE SURGERY  4709   Right hip   Family History  Problem Relation Age of Onset   Arthritis Mother    Hypertension Mother     Mental illness Mother    Diabetes Mother    Alcohol abuse Father    Cancer Father    Hypertension Father    Mental illness Brother    Heart disease Maternal Grandmother    Hypertension Maternal Grandmother    Mental illness Maternal Grandmother    Diabetes Maternal Grandmother    Cancer Maternal Grandfather    Hypertension Maternal Grandfather    Mental illness Paternal Grandmother    Social History   Socioeconomic History   Marital status: Married    Spouse name: Not on file   Number of children: Not on file   Years of education: Not on file   Highest education level: Not on file  Occupational History   Not on file  Tobacco Use   Smoking status: Never   Smokeless tobacco: Never  Substance and Sexual Activity   Alcohol use: No    Alcohol/week: 0.0 standard drinks   Drug use: No   Sexual activity: Yes    Partners: Male    Comment: Husband  Other Topics Concern   Not on file  Social History Narrative   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   B Complex Vitamins (VITAMIN-B COMPLEX) TABS Take by mouth.   clobetasol (OLUX) 0.05 % topical foam Apply to hands twice daily for up to 1 week then weekends only. Avoid applying to face, groin, and axilla. Use as directed. Risk of skin atrophy with long-term use reviewed.   Clobetasol Prop Emollient Base (CLOBETASOL PROPIONATE E) 0.05 % emollient cream Apply to skin once or twice a day as needed, avoid face, groin, axilla    Continuous Blood Gluc Receiver (DEXCOM G6 RECEIVER) DEVI Use 1 receiver to check glucose every 6 hours.  Diagnosis code E16.2   Continuous Blood Gluc Receiver (FREESTYLE LIBRE 14 DAY READER) DEVI Use to check sugar 4 times daily   Continuous Blood Gluc Sensor (FREESTYLE LIBRE 14 DAY SENSOR) MISC Apply once every 14 days.   Continuous Blood Gluc Transmit (DEXCOM G6 TRANSMITTER) MISC Use 1 transmitter to check glucose every 6 hours.  Diagnosis code E16.2   Crisaborole (EUCRISA) 2 % OINT Apply 1 application topically in the morning and at bedtime. Apply to affected areas of hands   etanercept (ENBREL SURECLICK) 50 MG/ML injection Inject 50 mLs into the skin once a week. On friday   fexofenadine (ALLEGRA) 180 MG tablet TAKE ONE TABLET BY MOUTH DAILY   fluticasone (FLONASE) 50 MCG/ACT nasal spray Place 2 sprays into both nostrils daily.   fluticasone (FLONASE) 50 MCG/ACT nasal spray Place 2 sprays into both nostrils daily.   folic acid (FOLVITE) 1 MG tablet TAKE ONE TABLET BY MOUTH DAILY   gabapentin (NEURONTIN) 300 MG capsule Take 300 mg by mouth 3 (three) times daily.   hydrocortisone (ANUSOL-HC) 2.5 % rectal cream Place 1 application rectally 2 (two) times daily.   hydrocortisone 2.5 % cream Apply to affected areas twice daily as needed for up to 1 week. Can repeat in a month if needed   ketoconazole (NIZORAL) 2 % cream Apply to affected area twice daily as needed   levonorgestrel (LILETTA) 18.6 MCG/DAY IUD IUD by Intrauterine route.   methocarbamol (ROBAXIN) 750 MG tablet Take 1 tablet (750 mg total) by mouth every 8 (eight) hours as needed for muscle spasms.   montelukast (SINGULAIR) 10 MG tablet TAKE ONE TABLET BY MOUTH EVERY NIGHT AT BEDTIME   Multiple Vitamin (MULTI-VITAMINS) TABS Take by mouth.   nystatin-triamcinolone ointment (MYCOLOG) Apply 1 application topically 2 (two) times daily.   ondansetron (ZOFRAN-ODT) 4 MG disintegrating tablet    oxyCODONE-acetaminophen (PERCOCET/ROXICET) 5-325  MG tablet Take 1 tablet by mouth 2 (two) times daily as needed for up to 5 days for severe pain.   promethazine (PHENERGAN) 25 MG tablet Take 1 tablet (25 mg total) by mouth every 8 (eight) hours as needed for nausea or vomiting.   spironolactone (ALDACTONE) 25 MG tablet Take 25 mg by mouth daily.   Vibegron (GEMTESA) 75 MG TABS Take 75 mg by mouth daily.   Allergies  Allergen Reactions   Penicillins Rash and Swelling   Oxycodone-Acetaminophen Rash   Tramadol Anxiety and Itching   Codeine Other (See Comments)    Chest pains   Tape Hives  Recent Results (from the past 2160 hour(s))  POCT Urinalysis Dipstick     Status: Abnormal   Collection Time: 02/02/21  2:29 PM  Result Value Ref Range   Color, UA yellow    Clarity, UA clear    Glucose, UA Negative Negative   Bilirubin, UA negative    Ketones, UA negative    Spec Grav, UA 1.015 1.010 - 1.025   Blood, UA negative    pH, UA 6.0 5.0 - 8.0   Protein, UA Negative Negative   Urobilinogen, UA 0.2 0.2 or 1.0 E.U./dL   Nitrite, UA negative    Leukocytes, UA Trace (A) Negative   Appearance clear    Odor none   POCT Influenza A/B     Status: Normal   Collection Time: 02/23/21 10:08 AM  Result Value Ref Range   Influenza A, POC Negative Negative   Influenza B, POC Negative Negative  POCT rapid strep A     Status: Normal   Collection Time: 02/23/21 10:08 AM  Result Value Ref Range   Rapid Strep A Screen Negative Negative  Culture, group A strep     Status: None   Collection Time: 02/23/21 10:21 AM   Specimen: Throat  Result Value Ref Range   Specimen Description THROAT    Special Requests Normal    Culture      NO GROUP A STREP (S.PYOGENES) ISOLATED Performed at [facility], [address], [place], [place] [address]    Report Status 02/25/2021 FINAL   Novel Coronavirus, NAA ([facility])     Status: None   Collection Time: 02/23/21 10:21 AM   Specimen: Nasopharyngeal(NP) swabs in vial transport medium   Nasopharynge   Patient  Result Value Ref Range   SARS-CoV-2, NAA Not Detected Not Detected    Comment: This nucleic acid amplification test was developed and its performance characteristics determined by [employer]. Nucleic acid amplification tests include RT-PCR and TMA. This test has not been FDA cleared or approved. This test has been authorized by FDA under an Emergency Use Authorization (EUA). This test is only authorized for the duration of time the declaration that circumstances exist justifying the authorization of the emergency use of in vitro diagnostic tests for detection of SARS-CoV-2 virus and/or diagnosis of COVID-19 infection under section 564(b)(1) of the Act, 21 U.S.C. 175ZWC-5(E) (1), unless the authorization is terminated or revoked sooner. When diagnostic testing is negative, the possibility of a false negative result should be considered in the context of a patient's recent exposures and the presence of clinical signs and symptoms consistent with COVID-19. An individual without symptoms of COVID-19 and who is not shedding SARS-CoV-2 virus wo uld expect to have a negative (not detected) result in this assay.   SARS-COV-2, NAA 2 DAY TAT     Status: None   Collection Time: 02/23/21 10:21 AM   Nasopharynge  Patient  Result Value Ref Range   SARS-CoV-2, NAA 2 DAY TAT Performed    Objective  Body mass index is 40.63 kg/m. Wt Readings from Last 3 Encounters:  04/29/21 259 lb 6.4 oz (117.7 kg)  02/02/21 250 lb 6.4 oz (113.6 kg)  08/23/20 236 lb (107 kg)   Temp Readings from Last 3 Encounters:  04/29/21 97.8 F (36.6 C) (Oral)  02/23/21 98.6 F (37 C)  02/02/21 98 F (36.7 C) (Oral)   BP Readings from Last 3 Encounters:  04/29/21 124/88  02/23/21 140/84  02/02/21 125/70   Pulse Readings from Last  3 Encounters:  04/29/21 68  02/23/21 61  02/02/21 72    Physical Exam Vitals and nursing note reviewed.  Constitutional:      Appearance: Normal appearance.  She is well-developed and well-groomed.  HENT:     Head: Normocephalic and atraumatic.  Eyes:     Conjunctiva/sclera: Conjunctivae normal.     Pupils: Pupils are equal, round, and reactive to light.  Cardiovascular:     Rate and Rhythm: Normal rate and regular rhythm.     Heart sounds: Normal heart sounds. No murmur heard. Pulmonary:     Effort: Pulmonary effort is normal.     Breath sounds: Normal breath sounds.  Abdominal:     General: Abdomen is flat. Bowel sounds are normal.     Tenderness: There is abdominal tenderness in the right upper quadrant, epigastric area and periumbilical area.     Comments: Moderate   Musculoskeletal:        General: No tenderness.  Skin:    General: Skin is warm and dry.  Neurological:     General: No focal deficit present.     Mental Status: She is alert and oriented to person, place, and time. Mental status is at baseline.     Cranial Nerves: Cranial nerves 2-12 are intact.     Motor: Motor function is intact.     Coordination: Coordination is intact.     Gait: Gait is intact.  Psychiatric:        Attention and Perception: Attention and perception normal.        Mood and Affect: Mood and affect normal.        Speech: Speech normal.        Behavior: Behavior normal. Behavior is cooperative.        Thought Content: Thought content normal.        Cognition and Memory: Cognition and memory normal.        Judgment: Judgment normal.    Assessment  Plan  Generalized abdominal pain lipase negative CT +enteritis pt reports resolved diarrhea- Plan: Comprehensive metabolic panel, CBC with Differential/Platelet, Urine Culture, CT ABDOMEN PELVIS WO CONTRAST, oxyCODONE-acetaminophen (PERCOCET/ROXICET) 5-325 MG tablet Ct results   IMPRESSION: 1. Trace peripancreatic fluid, which could reflect acute pancreatitis. Correlate with serum lipase levels. 2. No evidence of urinary tract calculi or obstruction. 3. Prominent fluid-filled loops of small bowel in  the mid abdomen which may reflect enteritis. 4. Postsurgical changes of gastric bypass.     Electronically Signed   By: Dahlia Bailiff M.D.   On: 04/29/2021 16:44 Acute right-sided low back pain, unspecified whether sciatica present - Plan: CT ABDOMEN PELVIS WO CONTRAST, oxyCODONE-acetaminophen (PERCOCET/ROXICET) 5-325 MG tablet  History of kidney stones - Plan: CT ABDOMEN PELVIS WO CONTRAST, oxyCODONE-acetaminophen (PERCOCET/ROXICET) 5-325 MG tablet  Nausea - Plan: promethazine (PHENERGAN) 25 MG tablet tid prn alt with zofran    B12 deficiency - Plan: Vitamin B12 Vitamin D deficiency - Plan: Vitamin D (25 hydroxy) Provider: Dr. Olivia Mackie McLean-Scocuzza-[facility]

## 2021-04-29 NOTE — Addendum Note (Signed)
Addended by: Orland Mustard on: 04/29/2021 03:53 PM ? ? Modules accepted: Orders ? ?

## 2021-04-29 NOTE — Addendum Note (Signed)
Addended by: Leeanne Rio on: 04/29/2021 03:53 PM ? ? Modules accepted: Orders ? ?

## 2021-04-30 ENCOUNTER — Other Ambulatory Visit: Payer: Self-pay | Admitting: Internal Medicine

## 2021-04-30 ENCOUNTER — Other Ambulatory Visit
Admission: RE | Admit: 2021-04-30 | Discharge: 2021-04-30 | Disposition: A | Discharge status: Home / Self Care | Payer: BC Managed Care – PPO | Attending: Family Medicine | Admitting: Family Medicine

## 2021-04-30 DIAGNOSIS — E669 Obesity, unspecified: Secondary | ICD-10-CM | POA: Diagnosis not present

## 2021-04-30 DIAGNOSIS — R109 Unspecified abdominal pain: Secondary | ICD-10-CM

## 2021-04-30 DIAGNOSIS — E162 Hypoglycemia, unspecified: Secondary | ICD-10-CM | POA: Insufficient documentation

## 2021-04-30 DIAGNOSIS — R1013 Epigastric pain: Secondary | ICD-10-CM | POA: Diagnosis not present

## 2021-04-30 DIAGNOSIS — Z13220 Encounter for screening for lipoid disorders: Secondary | ICD-10-CM | POA: Insufficient documentation

## 2021-04-30 LAB — CBC WITH DIFFERENTIAL/PLATELET
Absolute Monocytes: 689 cells/uL (ref 200–950)
Basophils Absolute: 18 cells/uL (ref 0–200)
Basophils Relative: 0.4 %
Eosinophils Absolute: 99 cells/uL (ref 15–500)
Eosinophils Relative: 2.2 %
HCT: 31.4 % — ABNORMAL LOW (ref 35.0–45.0)
Hemoglobin: 9.8 g/dL — ABNORMAL LOW (ref 11.7–15.5)
Lymphs Abs: 1395 cells/uL (ref 850–3900)
MCH: 25.1 pg — ABNORMAL LOW (ref 27.0–33.0)
MCHC: 31.2 g/dL — ABNORMAL LOW (ref 32.0–36.0)
MCV: 80.5 fL (ref 80.0–100.0)
MPV: 12.5 fL (ref 7.5–12.5)
Monocytes Relative: 15.3 %
Neutro Abs: 2300 cells/uL (ref 1500–7800)
Neutrophils Relative %: 51.1 %
Platelets: 203 10*3/uL (ref 140–400)
RBC: 3.9 10*6/uL (ref 3.80–5.10)
RDW: 13.5 % (ref 11.0–15.0)
Total Lymphocyte: 31 %
WBC: 4.5 10*3/uL (ref 3.8–10.8)

## 2021-04-30 LAB — COMPLETE METABOLIC PANEL WITH GFR
AG Ratio: 1.5 (calc) (ref 1.0–2.5)
ALT: 24 U/L (ref 6–29)
AST: 26 U/L (ref 10–30)
Albumin: 4 g/dL (ref 3.6–5.1)
Alkaline phosphatase (APISO): 73 U/L (ref 31–125)
BUN/Creatinine Ratio: 28 (calc) — ABNORMAL HIGH (ref 6–22)
BUN: 13 mg/dL (ref 7–25)
CO2: 25 mmol/L (ref 20–32)
Calcium: 9 mg/dL (ref 8.6–10.2)
Chloride: 104 mmol/L (ref 98–110)
Creat: 0.46 mg/dL — ABNORMAL LOW (ref 0.50–0.99)
Globulin: 2.7 g/dL (calc) (ref 1.9–3.7)
Glucose, Bld: 84 mg/dL (ref 65–99)
Potassium: 4.1 mmol/L (ref 3.5–5.3)
Sodium: 138 mmol/L (ref 135–146)
Total Bilirubin: 0.4 mg/dL (ref 0.2–1.2)
Total Protein: 6.7 g/dL (ref 6.1–8.1)
eGFR: 122 mL/min/{1.73_m2} (ref >=60)

## 2021-04-30 LAB — LIPASE, BLOOD: Lipase: 29 U/L (ref 11–51)

## 2021-04-30 LAB — VITAMIN D 25 HYDROXY (VIT D DEFICIENCY, FRACTURES): Vit D, 25-Hydroxy: 36 ng/mL (ref 30–100)

## 2021-04-30 LAB — LIPID PANEL
Cholesterol: 116 mg/dL (ref 0–200)
HDL: 52 mg/dL (ref >40)
LDL Cholesterol: 60 mg/dL (ref 0–99)
Total CHOL/HDL Ratio: 2.2 RATIO
Triglycerides: 20 mg/dL (ref <150)
VLDL: 4 mg/dL (ref 0–40)

## 2021-04-30 LAB — VITAMIN B12: Vitamin B-12: 271 pg/mL (ref 200–1100)

## 2021-04-30 MED ORDER — DICYCLOMINE HCL 20 MG PO TABS: 20.0000 mg | ORAL_TABLET | Freq: Three times a day (TID) | ORAL | 0 refills | Status: DC

## 2021-05-01 LAB — URINE CULTURE
MICRO NUMBER:: 13085370
SPECIMEN QUALITY:: ADEQUATE

## 2021-05-02 LAB — HEMOGLOBIN A1C
Hgb A1c MFr Bld: 4.9 % (ref 4.8–5.6)
Mean Plasma Glucose: 94 mg/dL

## 2021-05-03 ENCOUNTER — Encounter: Payer: Self-pay | Admitting: Family Medicine

## 2021-05-03 ENCOUNTER — Ambulatory Visit (INDEPENDENT_AMBULATORY_CARE_PROVIDER_SITE_OTHER): Payer: BC Managed Care – PPO | Admitting: Family Medicine

## 2021-05-03 ENCOUNTER — Other Ambulatory Visit: Payer: Self-pay

## 2021-05-03 VITALS — BP 120/70 | HR 71 | Temp 97.6°F | Ht 67.0 in | Wt 257.4 lb

## 2021-05-03 DIAGNOSIS — E611 Iron deficiency: Secondary | ICD-10-CM | POA: Insufficient documentation

## 2021-05-03 DIAGNOSIS — K289 Gastrojejunal ulcer, unspecified as acute or chronic, without hemorrhage or perforation: Secondary | ICD-10-CM | POA: Insufficient documentation

## 2021-05-03 DIAGNOSIS — Z13220 Encounter for screening for lipoid disorders: Secondary | ICD-10-CM | POA: Diagnosis not present

## 2021-05-03 DIAGNOSIS — R1011 Right upper quadrant pain: Secondary | ICD-10-CM | POA: Insufficient documentation

## 2021-05-03 DIAGNOSIS — R519 Headache, unspecified: Secondary | ICD-10-CM | POA: Insufficient documentation

## 2021-05-03 DIAGNOSIS — I10 Essential (primary) hypertension: Secondary | ICD-10-CM

## 2021-05-03 DIAGNOSIS — Z0001 Encounter for general adult medical examination with abnormal findings: Secondary | ICD-10-CM

## 2021-05-03 DIAGNOSIS — M79675 Pain in left toe(s): Secondary | ICD-10-CM

## 2021-05-03 DIAGNOSIS — M25511 Pain in right shoulder: Secondary | ICD-10-CM

## 2021-05-03 DIAGNOSIS — M79676 Pain in unspecified toe(s): Secondary | ICD-10-CM | POA: Insufficient documentation

## 2021-05-03 DIAGNOSIS — N3281 Overactive bladder: Secondary | ICD-10-CM | POA: Diagnosis not present

## 2021-05-03 DIAGNOSIS — F419 Anxiety disorder, unspecified: Secondary | ICD-10-CM

## 2021-05-03 DIAGNOSIS — G43909 Migraine, unspecified, not intractable, without status migrainosus: Secondary | ICD-10-CM | POA: Insufficient documentation

## 2021-05-03 DIAGNOSIS — D509 Iron deficiency anemia, unspecified: Secondary | ICD-10-CM

## 2021-05-03 DIAGNOSIS — E162 Hypoglycemia, unspecified: Secondary | ICD-10-CM

## 2021-05-03 DIAGNOSIS — K529 Noninfective gastroenteritis and colitis, unspecified: Secondary | ICD-10-CM | POA: Insufficient documentation

## 2021-05-03 DIAGNOSIS — M8580 Other specified disorders of bone density and structure, unspecified site: Secondary | ICD-10-CM | POA: Diagnosis not present

## 2021-05-03 DIAGNOSIS — G5600 Carpal tunnel syndrome, unspecified upper limb: Secondary | ICD-10-CM | POA: Insufficient documentation

## 2021-05-03 DIAGNOSIS — R300 Dysuria: Secondary | ICD-10-CM | POA: Insufficient documentation

## 2021-05-03 DIAGNOSIS — F32A Depression, unspecified: Secondary | ICD-10-CM

## 2021-05-03 DIAGNOSIS — G5601 Carpal tunnel syndrome, right upper limb: Secondary | ICD-10-CM

## 2021-05-03 DIAGNOSIS — G8929 Other chronic pain: Secondary | ICD-10-CM

## 2021-05-03 DIAGNOSIS — K8020 Calculus of gallbladder without cholecystitis without obstruction: Secondary | ICD-10-CM | POA: Insufficient documentation

## 2021-05-03 DIAGNOSIS — Z1231 Encounter for screening mammogram for malignant neoplasm of breast: Secondary | ICD-10-CM

## 2021-05-03 MED ORDER — SOLIFENACIN SUCCINATE 5 MG PO TABS: 5.0000 mg | ORAL_TABLET | Freq: Every day | ORAL | 1 refills | Status: DC

## 2021-05-03 MED ORDER — VENLAFAXINE HCL ER 150 MG PO CP24: 150.0000 mg | ORAL_CAPSULE | Freq: Every day | ORAL | 1 refills | Status: DC

## 2021-05-03 NOTE — Assessment & Plan Note (Signed)
She will follow-up with GI to get her iron infusions scheduled. ?

## 2021-05-03 NOTE — Assessment & Plan Note (Signed)
Symptoms have progressively been improving.  She will monitor and if they do not continue to improve or if they worsen she will let us or GI know. ?

## 2021-05-03 NOTE — Assessment & Plan Note (Signed)
We will trial Vesicare.  Discussed potential for constipation, dry mouth, and confusion.  If those occur she will let us know.  Advised to seek medical attention if she developed urinary retention. ?

## 2021-05-03 NOTE — Assessment & Plan Note (Signed)
She will see orthopedics as planned. ?

## 2021-05-03 NOTE — Assessment & Plan Note (Signed)
Physical exam completed.  Encouraged healthy diet and exercise.  Mammogram ordered.  She declines flu and COVID vaccines.  Lab work reviewed. ?

## 2021-05-03 NOTE — Patient Instructions (Addendum)
Nice to see you. ?Somebody will call you to schedule a mammogram. ?We will increase her venlafaxine to 150 mg once daily.  Please try to monitor your blood pressure periodically when increasing this.  We will try Vesicare for your overactive bladder.   ?If you develop any dry mouth, constipation, or mental status changes please let us know. ?Please monitor your left foot and if the toe does not improve over the next week please let us know. ?

## 2021-05-03 NOTE — Assessment & Plan Note (Addendum)
Slight skin separation under the nail of her second toe on the left foot. Discussed option of monitoring for another week vs seeing podiatry. Patient opted to monitor. Discussed using a pad and/or topical antibiotic ointment to provide some barrier for the area. If not improving over the next week she will let me know.  ?

## 2021-05-03 NOTE — Progress Notes (Signed)
[name], MD Phone: [phone]  Jill Hawkins is a 46 y.o. female who presents today for CPE.  Diet: generally good Exercise: walks some though lacks the energy for this  Pap smear: 11/11/19 NILM neg HPV Mammogram: due Family history-  Colon cancer: no  Breast cancer: no  Ovarian cancer: no Menses: IUD Vaccines-   Flu: declines  Tetanus: UTD  COVID19: x2, defers further vaccines HIV screening: UTD Hep C Screening: UTD Tobacco use: no  Alcohol use: occasional Illicit Drug use: no Dentist: yes Ophthalmology: yes  Anxiety/depression: Patient notes this is somewhat better with the switch to venlafaxine.  She notes a lot of her symptoms are related to work and not feeling good physically.  She notes no SI.  Abdominal pain: Patient was recently evaluated for abdominal pain.  She had a CT scan revealing likely enteritis.  She has messaged her GI physician regarding this.  She still has some nausea and stomach pain though it is better than it was.  He has been sticking to a bland diet.  Anemia: Patient has iron deficiency anemia.  She has remained anemic despite taking oral iron.  She had a recent EGD and colonoscopy which she reports was fine.  Her GI physician was going to set her up with iron infusions though she notes this was declined by her insurance.  Left second toe pain: This has been going on for 1.5 months.  She felt as though there was some extra skin underneath the toenail and she clipped it off and it bled and then felt better though started to bother her again.  She notes no injury.  No medications.  Right breast tenderness: This has been going on for multiple years.  She had evaluation last year for this and reports there were no significant findings.  She noted the radiologist felt as though this may be related to her chronic shoulder issues.  Right shoulder pain: Patient reports she had an MRI that revealed a partial tear.  She is following with orthopedics  for this.  Migraines: Patient notes she has not had these daily though does have them more frequently than she used to.  She does have a history of these.  She notes some numbness related to carpal tunnel syndrome and ulnar neuropathy though no other numbness.  No weakness or vision changes.  Overactive bladder: Patient reports her insurance would not pay for the [facility].  She continues to have issues with frequent urination and urinary urgency.   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   Allergic rhinitis 06/20/2016   Right shoulder pain 06/20/2016   Candidal intertrigo 06/20/2016   Hemorrhoids 06/20/2016   Soft tissue mass 06/20/2016   Left leg swelling 11/17/2016   Anxiety and depression 05/17/2017   Hypoglycemic disorder 07/17/2017   GERD (gastroesophageal reflux disease) 11/30/2018   Skin lesion 11/30/2018   Wrist nodule 11/30/2018   Breast cancer screening 11/30/2018   Right hip pain 06/05/2019   Decreased sex drive 09/81/1914   Osteopenia 06/05/2019   Obesity (BMI 35.0-39.9 without comorbidity) 06/05/2019   COVID-19 02/24/2020   Labial lesion 03/10/2020   Breast pain, right 03/10/2020   Bright red blood per rectum 05/10/2020   Lateral  epicondylitis of right elbow 05/10/2020   Bilateral leg edema 05/10/2020   IDA (iron deficiency anemia) 08/23/2020   Palpitations 08/23/2020   Ingrown hair 08/23/2020   Overactive bladder 02/02/2021   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/25/2014   Cholelithiasis without obstruction 05/03/2021   Chronic use of opiate drug for therapeutic purpose 12/03/2017   Nausea 04/18/2016   Cyst of right ovary 04/14/2015   Dysuria 05/03/2021   Gastrojejunal ulcer 05/03/2021   Iron  deficiency 05/03/2021   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   Morbid (severe) obesity due to excess calories ([place]) 11/11/2012   Muscle strain of right scapular region 10/09/2018   Neck pain 12/03/2017   Pain in joint involving lower leg 04/25/2014   Positive anti-CCP test 07/10/2019   Enteritis 05/03/2021   Toe pain 05/03/2021   Headache 05/03/2021   Carpal tunnel syndrome 05/03/2021   Resolved Ambulatory Problems    Diagnosis Date Noted   Sinusitis, acute maxillary 03/24/2014   Encounter for routine gynecological examination 04/25/2014   Urinary frequency 06/22/2014   Preop examination 12/20/2015   Viral upper respiratory infection 12/20/2015   Respiratory infection 03/20/2016   Bradycardia 06/20/2016   Depression 06/20/2016   Sinusitis 11/17/2016   Urinary urgency 03/10/2020   Right upper quadrant pain 05/03/2021   Past Medical History:  Diagnosis Date   Frequent headaches    History of fainting spells of unknown cause    Hypertension    Kidney stones    Migraine    UTI (lower urinary tract infection)     Family History  Problem Relation Age of Onset   Arthritis Mother    Hypertension Mother    Mental illness Mother    Diabetes Mother    Alcohol abuse Father    Cancer Father    Hypertension Father    Mental illness Brother    Heart disease Maternal Grandmother    Hypertension Maternal Grandmother    Mental illness Maternal Grandmother    Diabetes Maternal Grandmother    Cancer Maternal Grandfather    Hypertension Maternal Grandfather    Mental illness Paternal Grandmother     Social History   Socioeconomic History   Marital status: Married    Spouse name: Not on file   Number of children: Not on file   Years of education: Not on file   Highest education level: Not on file  Occupational History   Not on file  Tobacco Use   Smoking status: Never   Smokeless tobacco: Never  Substance and Sexual  Activity   Alcohol use: No    Alcohol/week: 0.0 standard drinks   Drug use: No   Sexual activity: Yes    Partners: Male    Comment: Husband  Other Topics Concern   Not on file  Social History Narrative   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, children's   Lives with Husband    2 sons (67 and 33)   Pets- 4 cats and 1 dog   [place]    Enjoys Scientist, water quality   Social Determinants of Radio broadcast assistant Strain: Not on file  Food Insecurity: Not on file  Transportation Needs: Not on file  Physical Activity: Not on file  Stress: Not on file  Social Connections: Not on file  Intimate Partner Violence: Not on file    ROS  General:  Negative for nexplained weight loss, fever Skin: Negative for new or changing mole, sore that won't heal HEENT: Negative for trouble  hearing, trouble seeing, ringing in ears, mouth sores, hoarseness, change in voice, dysphagia. CV:  Negative for chest pain, dyspnea, edema, palpitations Resp: Negative for cough, dyspnea, hemoptysis GI: Positive for nausea, abdominal pain, negative for vomiting, diarrhea, constipation, melena, hematochezia. GU: Positive for frequent urination, sexual difficulty, negative for dysuria, incontinence, urinary hesitance, hematuria, vaginal or penile discharge, lumps in testicle or breasts MSK: Negative for muscle cramps or aches, joint pain or swelling Neuro: Positive for headaches, numbness, negative for weakness, dizziness, passing out/fainting Psych: Positive for depression, anxiety, negative for memory problems  Objective  Physical Exam Vitals:   05/03/21 0814  BP: 120/70  Pulse: 71  Temp: 97.6 F (36.4 C)  SpO2: 96%    BP Readings from Last 3 Encounters:  05/03/21 120/70  04/29/21 124/88  02/23/21 140/84   Wt Readings from Last 3 Encounters:  05/03/21 257 lb 6.4 oz (116.8 kg)  04/29/21 259 lb 6.4 oz (117.7 kg)  02/02/21 250 lb 6.4 oz (113.6 kg)    Physical Exam Constitutional:      General: She is  not in acute distress.    Appearance: She is not diaphoretic.  HENT:     Head: Normocephalic and atraumatic.  Eyes:     Conjunctiva/sclera: Conjunctivae normal.     Pupils: Pupils are equal, round, and reactive to light.  Cardiovascular:     Rate and Rhythm: Normal rate and regular rhythm.     Heart sounds: Normal heart sounds.  Pulmonary:     Effort: Pulmonary effort is normal.     Breath sounds: Normal breath sounds.  Chest:       Comments: Fulton Mole, CMA served as chaperone, no breast tenderness other than as outlined above, no breast masses, breast skin changes, or nipple inversion noted, no axillary masses bilaterally Abdominal:     General: Bowel sounds are normal. There is no distension.     Palpations: Abdomen is soft.     Tenderness: There is abdominal tenderness (Slight epigastric). There is no guarding or rebound.  Lymphadenopathy:     Cervical: No cervical adenopathy.  Skin:    General: Skin is warm and dry.  Neurological:     Mental Status: She is alert.     Assessment/Plan:   Problem List Items Addressed This Visit     Anxiety and depression    Improved though remains uncontrolled.  We will increase her Effexor to 150 mg once daily.  She will monitor for side effects and let us know if those occur.  She will follow-up in 3 months.      Relevant Medications   venlafaxine XR (EFFEXOR-XR) 150 MG 24 hr capsule   Breast cancer screening   Relevant Orders   MM 3D SCREEN BREAST BILATERAL   Carpal tunnel syndrome    She will see orthopedics as planned.      Relevant Medications   venlafaxine XR (EFFEXOR-XR) 150 MG 24 hr capsule   Encounter for general adult medical examination with abnormal findings    Physical exam completed.  Encouraged healthy diet and exercise.  Mammogram ordered.  She declines flu and COVID vaccines.  Lab work reviewed.      Enteritis    Symptoms have progressively been improving.  She will monitor and if they do not continue to  improve or if they worsen she will let us or GI know.      Headache    Chronic issue.  Somewhat worsened recently.  Stress may be playing a role.  Discussed Effexor could help improve her symptoms of her headaches.  If not improving we can explore other treatment options.      Relevant Medications   venlafaxine XR (EFFEXOR-XR) 150 MG 24 hr capsule   Hypoglycemic disorder   Relevant Orders   HgB A1c (Completed)   IDA (iron deficiency anemia)    She will follow-up with GI to get her iron infusions scheduled.      Osteopenia   Relevant Orders   Vitamin D (25 hydroxy)   Overactive bladder    We will trial Vesicare.  Discussed potential for constipation, dry mouth, and confusion.  If those occur she will let us know.  Advised to seek medical attention if she developed urinary retention.      Relevant Medications   solifenacin (VESICARE) 5 MG tablet   Right shoulder pain    It would seem that this is the source of the discomfort on palpation near her right axilla.  She will continue to see orthopedics for this.      Toe pain    Slight skin separation under the nail of her second toe on the left foot. Discussed option of monitoring for another week vs seeing podiatry. Patient opted to monitor. Discussed using a pad and/or topical antibiotic ointment to provide some barrier for the area. If not improving over the next week she will let me know.       Other Visit Diagnoses     Lipid screening    -  Primary   Relevant Orders   Lipid panel (Completed)   Essential hypertension       Relevant Orders   Comprehensive metabolic panel       Return in about 3 months (around 08/03/2021).  This visit occurred during the SARS-CoV-2 public health emergency.  Safety protocols were in place, including screening questions prior to the visit, additional usage of staff PPE, and extensive cleaning of exam room while observing appropriate contact time as indicated for disinfecting solutions.     [name], MD [place]

## 2021-05-03 NOTE — Assessment & Plan Note (Signed)
Improved though remains uncontrolled.  We will increase her Effexor to 150 mg once daily.  She will monitor for side effects and let us know if those occur.  She will follow-up in 3 months. ?

## 2021-05-03 NOTE — Assessment & Plan Note (Signed)
It would seem that this is the source of the discomfort on palpation near her right axilla.  She will continue to see orthopedics for this. ?

## 2021-05-03 NOTE — Assessment & Plan Note (Signed)
Chronic issue.  Somewhat worsened recently.  Stress may be playing a role.  Discussed Effexor could help improve her symptoms of her headaches.  If not improving we can explore other treatment options. ?

## 2021-05-06 ENCOUNTER — Other Ambulatory Visit: Payer: Self-pay

## 2021-05-06 DIAGNOSIS — Z1231 Encounter for screening mammogram for malignant neoplasm of breast: Secondary | ICD-10-CM

## 2021-05-16 NOTE — Progress Notes (Signed)
The diagnosis that should have been attached to the lab is obesity (E66.9). ?

## 2021-05-16 NOTE — Addendum Note (Signed)
Encounter addended by: Leone Haven, MD on: 05/16/2021 2:26 PM ? Actions taken: Clinical Note Signed

## 2021-06-04 ENCOUNTER — Other Ambulatory Visit: Payer: Self-pay | Admitting: Family Medicine

## 2021-06-13 ENCOUNTER — Other Ambulatory Visit: Payer: Self-pay | Admitting: Family Medicine

## 2021-06-13 DIAGNOSIS — J309 Allergic rhinitis, unspecified: Secondary | ICD-10-CM

## 2021-06-16 LAB — HM MAMMOGRAPHY

## 2021-07-05 ENCOUNTER — Ambulatory Visit (INDEPENDENT_AMBULATORY_CARE_PROVIDER_SITE_OTHER): Payer: BC Managed Care – PPO | Admitting: Podiatry

## 2021-07-05 DIAGNOSIS — Q828 Other specified congenital malformations of skin: Secondary | ICD-10-CM

## 2021-07-05 NOTE — Progress Notes (Signed)
?Subjective:  ?Patient ID: Jill Hawkins, female    DOB: [birth year],  MRN: [id number] ? ?Chief Complaint  ?Patient presents with  ? Toe Pain  ?  Left foot 2nd toe nail has something under the nail causing discomfort   ? ? ?46 y.o. female presents with the above complaint.  Patient presents with left second digit distal tip porokeratosis.  Patient did painful to touch is progressive gotten worse is causing her some discomfort especially while ambulating.  She states she does good amount of work on her feet.  She wears a dude shoes.  She has not seen anyone else prior to seeing me.  She wanted to discuss treatment options for this. ? ? ?Review of Systems: Negative except as noted in the HPI. Denies N/V/F/Ch. ? ?Past Medical History:  ?Diagnosis Date  ? Frequent headaches   ? GERD (gastroesophageal reflux disease)   ? History of fainting spells of unknown cause   ? Hypertension   ? Kidney stones   ? Migraine   ? UTI (lower urinary tract infection)   ? ? ?Current Outpatient Medications:  ?  ALPRAZolam (XANAX) 1 MG tablet, Take 1 tablet (1 mg total) by mouth daily as needed for anxiety., Disp: 15 tablet, Rfl: 0 ?  B Complex Vitamins (VITAMIN-B COMPLEX) TABS, Take by mouth. (Patient not taking: Reported on 05/03/2021), Disp: , Rfl:  ?  benzonatate (TESSALON) 100 MG capsule, Take 1 capsule (100 mg total) by mouth every 8 (eight) hours. (Patient not taking: Reported on 05/03/2021), Disp: 21 capsule, Rfl: 0 ?  chlorpheniramine-HYDROcodone (TUSSIONEX PENNKINETIC ER) 10-8 MG/5ML SUER, Take 5 mLs by mouth every 12 (twelve) hours as needed for cough., Disp: 140 mL, Rfl: 0 ?  clobetasol (OLUX) 0.05 % topical foam, Apply to hands twice daily for up to 1 week then weekends only. Avoid applying to face, groin, and axilla. Use as directed. Risk of skin atrophy with long-term use reviewed., Disp: 50 g, Rfl: 1 ?  Clobetasol Prop Emollient Base (CLOBETASOL PROPIONATE E) 0.05 % emollient cream, Apply to skin once or twice a day as needed,  avoid face, groin, axilla, Disp: 30 g, Rfl: 0 ?  Continuous Blood Gluc Receiver (DEXCOM G6 RECEIVER) DEVI, Use 1 receiver to check glucose every 6 hours.  Diagnosis code E16.2 (Patient not taking: Reported on 05/03/2021), Disp: 1 each, Rfl: 0 ?  Continuous Blood Gluc Receiver (FREESTYLE LIBRE 14 DAY READER) DEVI, Use to check sugar 4 times daily, Disp: 1 each, Rfl: 0 ?  Continuous Blood Gluc Sensor (FREESTYLE LIBRE 14 DAY SENSOR) MISC, Apply once every 14 days., Disp: 6 each, Rfl: 1 ?  Continuous Blood Gluc Transmit (DEXCOM G6 TRANSMITTER) MISC, Use 1 transmitter to check glucose every 6 hours.  Diagnosis code E16.2 (Patient not taking: Reported on 05/03/2021), Disp: 1 each, Rfl: 0 ?  Crisaborole (EUCRISA) 2 % OINT, Apply 1 application topically in the morning and at bedtime. Apply to affected areas of hands, Disp: 60 g, Rfl: 11 ?  dicyclomine (BENTYL) 20 MG tablet, Take 1 tablet (20 mg total) by mouth 4 (four) times daily -  before meals and at bedtime. As needed, Disp: 120 tablet, Rfl: 0 ?  etanercept (ENBREL SURECLICK) 50 MG/ML injection, Inject 50 mLs into the skin once a week. On friday, Disp: , Rfl:  ?  fexofenadine (ALLEGRA) 180 MG tablet, TAKE ONE TABLET BY MOUTH DAILY, Disp: 90 tablet, Rfl: 1 ?  fluticasone (FLONASE) 50 MCG/ACT nasal spray, Place 2 sprays into both  nostrils daily. (Patient not taking: Reported on 05/03/2021), Disp: 16 g, Rfl: 6 ?  fluticasone (FLONASE) 50 MCG/ACT nasal spray, Place 2 sprays into both nostrils daily., Disp: 16 mL, Rfl: 0 ?  folic acid (FOLVITE) 1 MG tablet, TAKE ONE TABLET BY MOUTH DAILY, Disp: 90 tablet, Rfl: 0 ?  gabapentin (NEURONTIN) 300 MG capsule, Take 300 mg by mouth 3 (three) times daily., Disp: , Rfl:  ?  hydrocortisone (ANUSOL-HC) 2.5 % rectal cream, Place 1 application rectally 2 (two) times daily., Disp: 30 g, Rfl: 0 ?  hydrocortisone 2.5 % cream, Apply to affected areas twice daily as needed for up to 1 week. Can repeat in a month if needed, Disp: 28 g, Rfl: 3 ?   ketoconazole (NIZORAL) 2 % cream, Apply to affected area twice daily as needed, Disp: 30 g, Rfl: 7 ?  levonorgestrel (LILETTA) 18.6 MCG/DAY IUD IUD, by Intrauterine route., Disp: , Rfl:  ?  methocarbamol (ROBAXIN) 750 MG tablet, Take 1 tablet (750 mg total) by mouth every 8 (eight) hours as needed for muscle spasms., Disp: 30 tablet, Rfl: 1 ?  montelukast (SINGULAIR) 10 MG tablet, TAKE ONE TABLET BY MOUTH EVERY NIGHT AT BEDTIME, Disp: 30 tablet, Rfl: 2 ?  Multiple Vitamin (MULTI-VITAMINS) TABS, Take by mouth., Disp: , Rfl:  ?  nystatin-triamcinolone ointment (MYCOLOG), Apply 1 application topically 2 (two) times daily., Disp: 30 g, Rfl: 0 ?  omeprazole (PRILOSEC) 20 MG capsule, Take by mouth., Disp: , Rfl:  ?  ondansetron (ZOFRAN-ODT) 4 MG disintegrating tablet, , Disp: , Rfl:  ?  promethazine (PHENERGAN) 25 MG tablet, Take 1 tablet (25 mg total) by mouth every 8 (eight) hours as needed for nausea or vomiting., Disp: 40 tablet, Rfl: 0 ?  solifenacin (VESICARE) 5 MG tablet, Take 1 tablet (5 mg total) by mouth daily., Disp: 90 tablet, Rfl: 1 ?  spironolactone (ALDACTONE) 25 MG tablet, Take 25 mg by mouth daily., Disp: , Rfl:  ?  venlafaxine XR (EFFEXOR-XR) 150 MG 24 hr capsule, Take 1 capsule (150 mg total) by mouth daily with breakfast., Disp: 90 capsule, Rfl: 1 ? ?Social History  ? ?Tobacco Use  ?Smoking Status Never  ?Smokeless Tobacco Never  ? ? ?Allergies  ?Allergen Reactions  ? Penicillins Rash and Swelling  ? Oxycodone-Acetaminophen Rash  ? Tramadol Anxiety and Itching  ? Codeine Other (See Comments)  ?  Chest pains  ? Tape Hives  ? ?Objective:  ?There were no vitals filed for this visit. ?There is no height or weight on file to calculate BMI. ?Constitutional Well developed. ?Well nourished.  ?Vascular Dorsalis pedis pulses palpable bilaterally. ?Posterior tibial pulses palpable bilaterally. ?Capillary refill normal to all digits.  ?No cyanosis or clubbing noted. ?Pedal hair growth normal.  ?Neurologic Normal  speech. ?Oriented to person, place, and time. ?Epicritic sensation to light touch grossly present bilaterally.  ?Dermatologic Hyperkeratotic lesion with central nucleated core noted to the left second digit distal tip.  Mild hammertoe contracture noted.  Pain on palpation to the lesion.  No pinpoint bleeding noted upon debridement  ?Orthopedic: Normal joint ROM without pain or crepitus bilaterally. ?No visible deformities. ?No bony tenderness.  ? ?Radiographs: None ?Assessment:  ? ?1. Porokeratosis   ? ?Plan:  ?Patient was evaluated and treated and all questions answered. ? ?Left second digit porokeratosis ?-All questions and concerns were discussed with the patient in extensive detail.  I believe she will benefit from debridement of the lesion to give her some relief.  Using chisel  blade handle the lesion was debrided down to healthy striated tissue.  No complication noted no pinpoint bleeding noted. ?-I also discussed with her that she will benefit from shoe gear modification to more supportive shoes while on her feet.  Also discussed over-the-counter versus custom insoles as well.  Porokeratosis ? ?No follow-ups on file. ?

## 2021-08-03 ENCOUNTER — Ambulatory Visit: Payer: BC Managed Care – PPO | Admitting: Family Medicine

## 2021-08-10 ENCOUNTER — Other Ambulatory Visit: Payer: Self-pay | Admitting: Family Medicine

## 2021-09-01 ENCOUNTER — Other Ambulatory Visit: Payer: Self-pay | Admitting: Family Medicine

## 2021-09-05 ENCOUNTER — Encounter: Payer: Self-pay | Admitting: Family Medicine

## 2021-09-05 ENCOUNTER — Ambulatory Visit (INDEPENDENT_AMBULATORY_CARE_PROVIDER_SITE_OTHER): Payer: BC Managed Care – PPO | Admitting: Family Medicine

## 2021-09-05 VITALS — BP 100/70 | HR 54 | Temp 97.6°F | Ht 67.0 in | Wt 255.8 lb

## 2021-09-05 DIAGNOSIS — F419 Anxiety disorder, unspecified: Secondary | ICD-10-CM

## 2021-09-05 DIAGNOSIS — M5412 Radiculopathy, cervical region: Secondary | ICD-10-CM

## 2021-09-05 DIAGNOSIS — G43909 Migraine, unspecified, not intractable, without status migrainosus: Secondary | ICD-10-CM

## 2021-09-05 DIAGNOSIS — G8929 Other chronic pain: Secondary | ICD-10-CM

## 2021-09-05 DIAGNOSIS — F32A Depression, unspecified: Secondary | ICD-10-CM

## 2021-09-05 DIAGNOSIS — R519 Headache, unspecified: Secondary | ICD-10-CM

## 2021-09-05 DIAGNOSIS — R252 Cramp and spasm: Secondary | ICD-10-CM | POA: Diagnosis not present

## 2021-09-05 LAB — BASIC METABOLIC PANEL
BUN: 13 mg/dL (ref 6–23)
CO2: 29 mEq/L (ref 19–32)
Calcium: 8.8 mg/dL (ref 8.4–10.5)
Chloride: 106 mEq/L (ref 96–112)
Creatinine, Ser: 0.41 mg/dL (ref 0.40–1.20)
GFR: 119.76 mL/min (ref >60.00)
Glucose, Bld: 86 mg/dL (ref 70–99)
Potassium: 4.1 mEq/L (ref 3.5–5.1)
Sodium: 140 mEq/L (ref 135–145)

## 2021-09-05 LAB — MAGNESIUM: Magnesium: 1.8 mg/dL (ref 1.5–2.5)

## 2021-09-05 MED ORDER — PREGABALIN 50 MG PO CAPS: 50.0000 mg | ORAL_CAPSULE | Freq: Two times a day (BID) | ORAL | 3 refills | Status: DC

## 2021-09-05 MED ORDER — SUMATRIPTAN SUCCINATE 50 MG PO TABS: 50.0000 mg | ORAL_TABLET | ORAL | 0 refills | Status: DC | PRN

## 2021-09-05 NOTE — Patient Instructions (Signed)
Nice to see you. We will have you stop your gabapentin.  You can start on Lyrica. Please try stretching your legs to see if that helps with your cramping. I sent in Imitrex for you to take as an abortive treatment for migraines.  Do not take more than 2 doses in a 24-hour time frame.

## 2021-09-05 NOTE — Assessment & Plan Note (Signed)
Continues to have relatively infrequent migraines.  I will prescribe Imitrex for her to take as abortive treatment.  She will continue on venlafaxine 150 mg daily.  Advised that she could take Imitrex 50 mg at the onset of her migraine and then if she still has a headache 2 hours later she can take a second dose.  Discussed she should not take more than 2 doses in 24 hours.  If she has side effects she will let us know.

## 2021-09-05 NOTE — Assessment & Plan Note (Signed)
Generally stable.  The patient wanted to stick with her venlafaxine 150 mg daily.  She will continue to monitor.

## 2021-09-05 NOTE — Assessment & Plan Note (Signed)
Some of her symptoms seem consistent with cervical radiculopathy though this could also represent muscular issues in her scapula and upper arm.  She will see the new pain specialist.  We will switch her from gabapentin to Lyrica.  She is on such a low-dose of gabapentin she can switch directly over to Lyrica.  She will take Lyrica 50 mg twice daily.  She will monitor for drowsiness.  If she notices that her other side effects she will let us know.

## 2021-09-05 NOTE — Assessment & Plan Note (Signed)
Discussed adequate water intake and stretching her legs.  We will check electrolytes today.  Advised she could try a teaspoon of yellow mustard daily to see if that would be beneficial.

## 2021-09-05 NOTE — Progress Notes (Signed)
[name], MD Phone: [phone]  Jill Hawkins is a 46 y.o. female who presents today for follow-up.  Anxiety/depression: Patient notes this is a little bit better.  She feels as though her symptoms are related to a lot of stress at work.  She notes there are some changes coming with other staff members and she does not like change.  She only used the Xanax for travel.  She remains on venlafaxine with no reported side effects.  No SI.  Migraines: Patient reports they are a little bit better.  They are occurring 2-3 times a month.  No numbness or weakness.  Right shoulder pain/right cervical radiculopathy: Patient reports she has seen orthopedics.  They have diagnosed her with a tear in her shoulder though she notes that they have said they did not believe that the pain is related to that given that her pain occurs in her scapula.  They referred her to pain management though she did not like the pain management encounter as it was done virtually though she still had to go to the office.  She notes she will not be going back to that pain specialist.  She is been doing physical therapy.  She has been referred to another pain specialist here in [place].  She does note the pain specialist she saw mentioned going up on the gabapentin or switching back to Lyrica.  Patient notes that her orthopedist has evaluated her neck and they do not think the pain is related to that.  She notes her pain got worse when she did physical therapy for her neck.  Leg cramps: Patient notes these have been going on a little while.  They did not improve with iron infusions.  They mostly occur at night though they do occur during the day as well.  She drinks close to a gallon of water a day.  She does not stretch.  Social History   Tobacco Use  Smoking Status Never  Smokeless Tobacco Never    Current Outpatient Medications on File Prior to Visit  Medication Sig Dispense Refill   ALPRAZolam (XANAX) 1 MG  tablet TAKE 1 TABLET(1 MG) BY MOUTH DAILY AS NEEDED FOR ANXIETY 15 tablet 0   B Complex Vitamins (VITAMIN-B COMPLEX) TABS Take by mouth.     benzonatate (TESSALON) 100 MG capsule Take 1 capsule (100 mg total) by mouth every 8 (eight) hours. 21 capsule 0   chlorpheniramine-HYDROcodone (TUSSIONEX PENNKINETIC ER) 10-8 MG/5ML SUER Take 5 mLs by mouth every 12 (twelve) hours as needed for cough. 140 mL 0   clobetasol (OLUX) 0.05 % topical foam Apply to hands twice daily for up to 1 week then weekends only. Avoid applying to face, groin, and axilla. Use as directed. Risk of skin atrophy with long-term use reviewed. 50 g 1   Clobetasol Prop Emollient Base (CLOBETASOL PROPIONATE E) 0.05 % emollient cream Apply to skin once or twice a day as needed, avoid face, groin, axilla 30 g 0   Continuous Blood Gluc Receiver (DEXCOM G6 RECEIVER) DEVI Use 1 receiver to check glucose every 6 hours.  Diagnosis code E16.2 1 each 0   Continuous Blood Gluc Receiver (FREESTYLE LIBRE 14 DAY READER) DEVI Use to check sugar 4 times daily 1 each 0   Continuous Blood Gluc Sensor (FREESTYLE LIBRE 14 DAY SENSOR) MISC Apply once every 14 days. 6 each 1   Continuous Blood Gluc Transmit (DEXCOM G6 TRANSMITTER) MISC Use 1 transmitter to check glucose every 6 hours.  Diagnosis  code E16.2 1 each 0   Crisaborole (EUCRISA) 2 % OINT Apply 1 application topically in the morning and at bedtime. Apply to affected areas of hands 60 g 11   dicyclomine (BENTYL) 20 MG tablet Take 1 tablet (20 mg total) by mouth 4 (four) times daily -  before meals and at bedtime. As needed 120 tablet 0   etanercept (ENBREL SURECLICK) 50 MG/ML injection Inject 50 mLs into the skin once a week. On friday     fexofenadine (ALLEGRA) 180 MG tablet TAKE ONE TABLET BY MOUTH DAILY 90 tablet 1   fluticasone (FLONASE) 50 MCG/ACT nasal spray Place 2 sprays into both nostrils daily. 16 g 6   fluticasone (FLONASE) 50 MCG/ACT nasal spray Place 2 sprays into both nostrils daily. 16  mL 0   folic acid (FOLVITE) 1 MG tablet TAKE ONE TABLET BY MOUTH DAILY 90 tablet 0   hydrocortisone (ANUSOL-HC) 2.5 % rectal cream Place 1 application rectally 2 (two) times daily. 30 g 0   hydrocortisone 2.5 % cream Apply to affected areas twice daily as needed for up to 1 week. Can repeat in a month if needed 28 g 3   ketoconazole (NIZORAL) 2 % cream Apply to affected area twice daily as needed 30 g 7   levonorgestrel (LILETTA) 18.6 MCG/DAY IUD IUD by Intrauterine route.     methocarbamol (ROBAXIN) 750 MG tablet Take 1 tablet (750 mg total) by mouth every 8 (eight) hours as needed for muscle spasms. 30 tablet 1   montelukast (SINGULAIR) 10 MG tablet TAKE ONE TABLET BY MOUTH EVERY NIGHT AT BEDTIME 30 tablet 2   Multiple Vitamin (MULTI-VITAMINS) TABS Take by mouth.     nystatin-triamcinolone ointment (MYCOLOG) Apply 1 application topically 2 (two) times daily. 30 g 0   ondansetron (ZOFRAN-ODT) 4 MG disintegrating tablet      promethazine (PHENERGAN) 25 MG tablet Take 1 tablet (25 mg total) by mouth every 8 (eight) hours as needed for nausea or vomiting. 40 tablet 0   solifenacin (VESICARE) 5 MG tablet Take 1 tablet (5 mg total) by mouth daily. 90 tablet 1   spironolactone (ALDACTONE) 25 MG tablet Take 25 mg by mouth daily.     venlafaxine XR (EFFEXOR-XR) 150 MG 24 hr capsule Take 1 capsule (150 mg total) by mouth daily with breakfast. 90 capsule 1   cyanocobalamin (,VITAMIN B-12,) 1000 MCG/ML injection Inject into the muscle.     omeprazole (PRILOSEC) 20 MG capsule Take by mouth.     [DISCONTINUED] buPROPion (WELLBUTRIN) 100 MG tablet TAKE ONE TABLET BY MOUTH THREE TIMES A DAY 90 tablet 0   No current facility-administered medications on file prior to visit.     ROS see history of present illness  Objective  Physical Exam Vitals:   09/05/21 0817  BP: 100/70  Pulse: (!) 54  Temp: 97.6 F (36.4 C)  SpO2: 97%    BP Readings from Last 3 Encounters:  09/05/21 100/70  05/03/21 120/70   04/29/21 124/88   Wt Readings from Last 3 Encounters:  09/05/21 255 lb 12.8 oz (116 kg)  05/03/21 257 lb 6.4 oz (116.8 kg)  04/29/21 259 lb 6.4 oz (117.7 kg)    Physical Exam Constitutional:      General: She is not in acute distress.    Appearance: She is not diaphoretic.  Cardiovascular:     Rate and Rhythm: Normal rate and regular rhythm.     Heart sounds: Normal heart sounds.  Pulmonary:  Effort: Pulmonary effort is normal.     Breath sounds: Normal breath sounds.  Musculoskeletal:     Comments: No midline neck tenderness, no midline neck step-off, no right trapezius tenderness, no right shoulder tenderness to palpation  Skin:    General: Skin is warm and dry.  Neurological:     Mental Status: She is alert.     Comments: 5/5 strength in bilateral biceps, triceps, grip, quads, hamstrings, plantar and dorsiflexion, sensation to light touch intact in bilateral UE and LE      Assessment/Plan: Please see individual problem list.  Problem List Items Addressed This Visit     Anxiety and depression (Chronic)    Generally stable.  The patient wanted to stick with her venlafaxine 150 mg daily.  She will continue to monitor.      Migraine (Chronic)    Continues to have relatively infrequent migraines.  I will prescribe Imitrex for her to take as abortive treatment.  She will continue on venlafaxine 150 mg daily.  Advised that she could take Imitrex 50 mg at the onset of her migraine and then if she still has a headache 2 hours later she can take a second dose.  Discussed she should not take more than 2 doses in 24 hours.  If she has side effects she will let us know.      Relevant Medications   pregabalin (LYRICA) 50 MG capsule   SUMAtriptan (IMITREX) 50 MG tablet   Right cervical radiculopathy - Primary (Chronic)    Some of her symptoms seem consistent with cervical radiculopathy though this could also represent muscular issues in her scapula and upper arm.  She will see  the new pain specialist.  We will switch her from gabapentin to Lyrica.  She is on such a low-dose of gabapentin she can switch directly over to Lyrica.  She will take Lyrica 50 mg twice daily.  She will monitor for drowsiness.  If she notices that her other side effects she will let us know.      Relevant Medications   pregabalin (LYRICA) 50 MG capsule   Muscle cramps    Discussed adequate water intake and stretching her legs.  We will check electrolytes today.  Advised she could try a teaspoon of yellow mustard daily to see if that would be beneficial.      Relevant Orders   Basic Metabolic Panel (BMET)   Magnesium   Requesting Dr. Ethel Rana most recent note.   Return in about 3 months (around 12/06/2021).   [name], MD [place]

## 2021-09-08 ENCOUNTER — Other Ambulatory Visit: Payer: Self-pay

## 2021-09-08 MED ORDER — WEGOVY 0.5 MG/0.5ML ~~LOC~~ SOAJ
SUBCUTANEOUS | 0 refills | Status: DC
  Filled 2021-09-08: qty 2, 28d supply, fill #0

## 2021-09-14 ENCOUNTER — Encounter: Payer: Self-pay | Admitting: Family Medicine

## 2021-09-20 ENCOUNTER — Other Ambulatory Visit: Payer: Self-pay

## 2021-09-20 MED ORDER — WEGOVY 2.4 MG/0.75ML ~~LOC~~ SOAJ: SUBCUTANEOUS | 0 refills | Status: DC

## 2021-09-20 MED ORDER — WEGOVY 1.7 MG/0.75ML ~~LOC~~ SOAJ: SUBCUTANEOUS | 0 refills | Status: DC

## 2021-09-20 MED ORDER — WEGOVY 1 MG/0.5ML ~~LOC~~ SOAJ
SUBCUTANEOUS | 0 refills | Status: DC
  Filled 2021-09-20: qty 2, 28d supply, fill #0

## 2021-10-10 ENCOUNTER — Other Ambulatory Visit: Payer: Self-pay

## 2021-10-10 ENCOUNTER — Other Ambulatory Visit: Payer: Self-pay | Admitting: Family Medicine

## 2021-10-10 DIAGNOSIS — G8929 Other chronic pain: Secondary | ICD-10-CM

## 2021-10-10 MED ORDER — SUMATRIPTAN SUCCINATE 50 MG PO TABS: 50.0000 mg | ORAL_TABLET | ORAL | 0 refills | Status: DC | PRN

## 2021-10-27 ENCOUNTER — Other Ambulatory Visit: Payer: Self-pay | Admitting: Family Medicine

## 2021-10-27 DIAGNOSIS — F419 Anxiety disorder, unspecified: Secondary | ICD-10-CM

## 2021-11-01 ENCOUNTER — Ambulatory Visit
Admission: RE | Admit: 2021-11-01 | Discharge: 2021-11-01 | Disposition: A | Discharge status: Home / Self Care | Payer: BC Managed Care – PPO | Source: Ambulatory Visit | Attending: Urgent Care | Admitting: Urgent Care

## 2021-11-01 VITALS — BP 125/85 | HR 71 | Temp 98.2°F | Resp 18 | Ht 67.0 in

## 2021-11-01 DIAGNOSIS — R21 Rash and other nonspecific skin eruption: Secondary | ICD-10-CM | POA: Diagnosis not present

## 2021-11-01 MED ORDER — PREDNISONE 10 MG PO TABS: ORAL_TABLET | ORAL | 0 refills | Status: DC

## 2021-11-01 MED ORDER — OMEPRAZOLE 40 MG PO CPDR: 40.0000 mg | DELAYED_RELEASE_CAPSULE | Freq: Every day | ORAL | 0 refills | Status: DC

## 2021-11-01 NOTE — Discharge Instructions (Addendum)
Recommended anti-histamine for symptom relief of itching. Non-sedating zyrtec or Claritin may be taken daily. May use benadryl (sedating) at night, especially if prednisone causes sleeplessness.  Complete course of prednisone taper even if symptoms resolve unless adverse effects occur.  Follow up with your primary care provider if your symptoms are not improving.

## 2021-11-01 NOTE — ED Triage Notes (Signed)
Patient to Urgent Care with complaints of a rash that started Saturday morning.  Rash initially started behind her right leg, has now spread onto abdomen and bilateral hands. Rash predominately on right side.    Denies any new product usage/ food usage. Has cared for her friends dog on Friday afternoon who she had to chase through the woods.

## 2021-11-01 NOTE — ED Provider Notes (Signed)
[facility]   [id number] 11/01/21 Arrival Time: 8466  ASSESSMENT & PLAN:  1. Rash and nonspecific skin eruption     Meds ordered this encounter  Medications   predniSONE (DELTASONE) 10 MG tablet    Sig: Take 4 tablets (40 mg) for four days, then 2 tablets (20 mg) for four days, then 1 tablet (10 mg) for four days.    Dispense:  28 tablet    Refill:  0    Order Specific Question:   Supervising Provider    Answer:   Chase Picket [[id number]]   omeprazole (PRILOSEC) 40 MG capsule    Sig: Take 1 capsule (40 mg total) by mouth daily. Take daily while taking prednisone to avoid gastric irritation from acid production.    Dispense:  30 capsule    Refill:  0    Order Specific Question:   Supervising Provider    Answer:   Chase Picket [id number]   Presents with dominantly unilateral rash of unknown etiology concentrated on lower right extremity, presumed atopic from environmental contact. Pruritic, papular, no discharge. Anti-histamine recommended for symptom relief. Prednisone taper ordered to resolve reaction.  Will f/u with PCP if not seeing improvement over the next 1 week.  Will follow up with PCP or here if worsening or failing to improve as anticipated.  Reviewed expectations re: course of current medical issues. Questions answered. Outlined signs and symptoms indicating need for more acute intervention. Patient verbalized understanding. After Visit Summary given.   SUBJECTIVE:  Jill JAMAIRA SHERK is a 46 y.o. female who presents with a skin complaint.   Location: diffuse, concentrated on R leg Onset: gradual, 4 days, starting with "bug bites" on RLE Duration: 4 days Associated pruritis? moderate Associated pain? none Progression: increasing steadily  Drainage? none Known trigger? No  New soaps/lotions/topicals/detergents? No  Environmental exposures?  Walk in the woods Contacts with similar? No  Recent travel? No  Other associated symptoms:  none Therapies tried thus far: none Arthralgia or myalgia? none Recent illness? none Fever? none New medications? none No specific aggravating or alleviating factors reported.  ROS: As per HPI.  OBJECTIVE: Vitals:   11/01/21 1727 11/01/21 1748  BP:  125/85  Pulse:  71  Resp:  18  Temp:  98.2 F (36.8 C)  SpO2:  98%  Height: '5\' 7"'$  (1.702 m)     General appearance: alert; no distress HEENT: [place]; AT Neck: supple with FROM Lungs: clear to auscultation bilaterally Heart: regular rate and rhythm Extremities: no edema; moves all extremities normally Skin: warm and dry; signs of infection: no; upper leg(s) right, lower leg(s) right; diffuse papular single area(s) of pink raised. scattered, papules;papules distribution: inner surface, outer surface, extensor surface, and flexor surface of right upper leg(s) and inner surface, outer surface, extensor surface, and flexor surface of right lower leg(s) Psychological: alert and cooperative; normal mood and affect  Allergies  Allergen Reactions   Penicillins Rash and Swelling   Oxycodone-Acetaminophen Rash   Tramadol Anxiety and Itching   Codeine Other (See Comments)    Chest pains   Tape Hives    Past Medical History:  Diagnosis Date   Frequent headaches    GERD (gastroesophageal reflux disease)    History of fainting spells of unknown cause    Hypertension    Kidney stones    Migraine    UTI (lower urinary tract infection)    Social History   Socioeconomic History   Marital status: Married  Spouse name: Not on file   Number of children: Not on file   Years of education: Not on file   Highest education level: Not on file  Occupational History   Not on file  Tobacco Use   Smoking status: Never   Smokeless tobacco: Never  Substance and Sexual Activity   Alcohol use: No    Alcohol/week: 0.0 standard drinks of alcohol   Drug use: No   Sexual activity: Yes    Partners: Male    Comment: Husband  Other Topics Concern    Not on file  Social History Narrative   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, [employer]   Lives with Husband    2 sons (54 and 20)   Pets- 4 cats and 1 dog   High School    Enjoys photography   Social Determinants of Health   Financial Resource Strain: Not on file  Food Insecurity: Not on file  Transportation Needs: Not on file  Physical Activity: Not on file  Stress: Not on file  Social Connections: Not on file  Intimate Partner Violence: Not on file   Family History  Problem Relation Age of Onset   Arthritis Mother    Hypertension Mother    Mental illness Mother    Diabetes Mother    Alcohol abuse Father    Cancer Father    Hypertension Father    Mental illness Brother    Heart disease Maternal Grandmother    Hypertension Maternal Grandmother    Mental illness Maternal Grandmother    Diabetes Maternal Grandmother    Cancer Maternal Grandfather    Hypertension Maternal Grandfather    Mental illness Paternal Grandmother    Past Surgical History:  Procedure Laterality Date   CARPAL TUNNEL RELEASE  2005   CHOLECYSTECTOMY  2015   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

## 2021-11-08 ENCOUNTER — Ambulatory Visit: Payer: BC Managed Care – PPO | Admitting: Family Medicine

## 2021-11-09 ENCOUNTER — Encounter: Payer: Self-pay | Admitting: Internal Medicine

## 2021-11-09 ENCOUNTER — Ambulatory Visit (INDEPENDENT_AMBULATORY_CARE_PROVIDER_SITE_OTHER): Payer: BC Managed Care – PPO | Admitting: Internal Medicine

## 2021-11-09 VITALS — BP 118/68 | HR 60 | Temp 98.2°F | Ht 67.0 in | Wt 260.6 lb

## 2021-11-09 DIAGNOSIS — L309 Dermatitis, unspecified: Secondary | ICD-10-CM | POA: Diagnosis not present

## 2021-11-09 DIAGNOSIS — H9201 Otalgia, right ear: Secondary | ICD-10-CM

## 2021-11-09 DIAGNOSIS — H6983 Other specified disorders of Eustachian tube, bilateral: Secondary | ICD-10-CM

## 2021-11-09 DIAGNOSIS — B379 Candidiasis, unspecified: Secondary | ICD-10-CM | POA: Diagnosis not present

## 2021-11-09 DIAGNOSIS — N898 Other specified noninflammatory disorders of vagina: Secondary | ICD-10-CM

## 2021-11-09 DIAGNOSIS — R300 Dysuria: Secondary | ICD-10-CM

## 2021-11-09 DIAGNOSIS — N760 Acute vaginitis: Secondary | ICD-10-CM

## 2021-11-09 MED ORDER — FLUCONAZOLE 150 MG PO TABS: 150.0000 mg | ORAL_TABLET | Freq: Once | ORAL | 0 refills | Status: AC

## 2021-11-09 MED ORDER — CIPROFLOXACIN-DEXAMETHASONE 0.3-0.1 % OT SUSP: 4.0000 [drp] | Freq: Two times a day (BID) | OTIC | 0 refills | Status: DC

## 2021-11-09 MED ORDER — CLOBETASOL PROPIONATE 0.05 % EX CREA: 1.0000 | TOPICAL_CREAM | Freq: Two times a day (BID) | CUTANEOUS | 1 refills | Status: DC

## 2021-11-09 NOTE — Patient Instructions (Signed)
Allegra and zyrtec  Nasal saline with glycerin  Add in flonase as needed   Eustachian Tube Dysfunction  Eustachian tube dysfunction refers to a condition in which a blockage develops in the narrow passage that connects the middle ear to the back of the nose (eustachian tube). The eustachian tube regulates air pressure in the middle ear by letting air move between the ear and nose. It also helps to drain fluid from the middle ear space. Eustachian tube dysfunction can affect one or both ears. When the eustachian tube does not function properly, air pressure, fluid, or both can build up in the middle ear. What are the causes? This condition occurs when the eustachian tube becomes blocked or cannot open normally. Common causes of this condition include: Ear infections. Colds and other infections that affect the nose, mouth, and throat (upper respiratory tract). Allergies. Irritation from cigarette smoke. Irritation from stomach acid coming up into the esophagus (gastroesophageal reflux). The esophagus is the part of the body that moves food from the mouth to the stomach. Sudden changes in air pressure, such as from descending in an airplane or scuba diving. Abnormal growths in the nose or throat, such as: Growths that line the nose (nasal polyps). Abnormal growth of cells (tumors). Enlarged tissue at the back of the throat (adenoids). What increases the risk? You are more likely to develop this condition if: You smoke. You are overweight. You are a child who has: Certain birth defects of the mouth, such as cleft palate. Large tonsils or adenoids. What are the signs or symptoms? Common symptoms of this condition include: A feeling of fullness in the ear. Ear pain. Clicking or popping noises in the ear. Ringing in the ear (tinnitus). Hearing loss. Loss of balance. Dizziness. Symptoms may get worse when the air pressure around you changes, such as when you travel to an area of high  elevation, fly on an airplane, or go scuba diving. How is this diagnosed? This condition may be diagnosed based on: Your symptoms. A physical exam of your ears, nose, and throat. Tests, such as those that measure: The movement of your eardrum. Your hearing (audiometry). How is this treated? Treatment depends on the cause and severity of your condition. In mild cases, you may relieve your symptoms by moving air into your ears. This is called "popping the ears." In more severe cases, or if you have symptoms of fluid in your ears, treatment may include: Medicines to relieve congestion (decongestants). Medicines that treat allergies (antihistamines). Nasal sprays or ear drops that contain medicines that reduce swelling (steroids). A procedure to drain the fluid in your eardrum. In this procedure, a small tube may be placed in the eardrum to: Drain the fluid. Restore the air in the middle ear space. A procedure to insert a balloon device through the nose to inflate the opening of the eustachian tube (balloon dilation). Follow these instructions at home: Lifestyle Do not do any of the following until your health care provider approves: Travel to high altitudes. Fly in airplanes. Work in a Pension scheme manager or room. Scuba dive. Do not use any products that contain nicotine or tobacco. These products include cigarettes, chewing tobacco, and vaping devices, such as e-cigarettes. If you need help quitting, ask your health care provider. Keep your ears dry. Wear fitted earplugs during showering and bathing. Dry your ears completely after. General instructions Take over-the-counter and prescription medicines only as told by your health care provider. Use techniques to help pop your ears  as recommended by your health care provider. These may include: Chewing gum. Yawning. Frequent, forceful swallowing. Closing your mouth, holding your nose closed, and gently blowing as if you are trying to blow  air out of your nose. Keep all follow-up visits. This is important. Contact a health care provider if: Your symptoms do not go away after treatment. Your symptoms come back after treatment. You are unable to pop your ears. You have: A fever. Pain in your ear. Pain in your head or neck. Fluid draining from your ear. Your hearing suddenly changes. You become very dizzy. You lose your balance. Get help right away if: You have a sudden, severe increase in any of your symptoms. Summary Eustachian tube dysfunction refers to a condition in which a blockage develops in the eustachian tube. It can be caused by ear infections, allergies, inhaled irritants, or abnormal growths in the nose or throat. Symptoms may include ear pain or fullness, hearing loss, or ringing in the ears. Mild cases are treated with techniques to unblock the ears, such as yawning or chewing gum. More severe cases are treated with medicines or procedures. This information is not intended to replace advice given to you by your health care provider. Make sure you discuss any questions you have with your health care provider. Document Revised: 04/26/2020 Document Reviewed: 04/26/2020 Elsevier Patient Education  [place].

## 2021-11-09 NOTE — Progress Notes (Signed)
Right leg rash since 11/01/21  Dr. Olivia Mackie McLean-Scocuzza

## 2021-11-09 NOTE — Progress Notes (Addendum)
Chief Complaint  Patient presents with   Ear Pain    Pt presents for right ear pain she has had for 2 weeks now. Pt stated it is off and on sharp pain and feels clogged sometimes. Pt also would like to discuss a rash she has on her right leg its not red but you can see the bumps and pt states it is itchy. Pt said she went to urgent care and they gave her predinosone but she still feels itchy   F/u  1. Right ear pain x 2 weeks sharp shooting, popping occasionally not established with ent nothing tried has allergies tried allegra and zyrtec not flonase with h/o nose bleeds but she has this  2. Itching rash right lower leg uses dove/olay soap went to urgent care 11/01/21 given oral steroids and helped but rash still there no new products otherwise  Rash happened after 2 bug bites 3. Yeast infection started today     Review of Systems  Constitutional:  Negative for weight loss.  HENT:  Positive for ear pain. Negative for hearing loss.   Eyes:  Negative for blurred vision.  Respiratory:  Negative for shortness of breath.   Cardiovascular:  Negative for chest pain.  Gastrointestinal:  Negative for abdominal pain and blood in stool.  Genitourinary:  Negative for dysuria.  Musculoskeletal:  Negative for falls and joint pain.  Skin:  Negative for rash.  Neurological:  Negative for headaches.  Psychiatric/Behavioral:  Negative for depression.    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   GROWTH PLATE SURGERY  7564   Right hip   Family History  Problem Relation Age of Onset   Arthritis Mother    Hypertension Mother    Mental illness Mother    Diabetes Mother     Alcohol abuse Father    Cancer Father    Hypertension Father    Mental illness Brother    Heart disease Maternal Grandmother    Hypertension Maternal Grandmother    Mental illness Maternal Grandmother    Diabetes Maternal Grandmother    Cancer Maternal Grandfather    Hypertension Maternal Grandfather    Mental illness Paternal Grandmother    Social History   Socioeconomic History   Marital status: Married    Spouse name: Not on file   Number of children: Not on file   Years of education: Not on file   Highest education level: Not on file  Occupational History   Not on file  Tobacco Use   Smoking status: Never   Smokeless tobacco: Never  Substance and Sexual Activity   Alcohol use: No    Alcohol/week: 0.0 standard drinks of alcohol   Drug use: No   Sexual activity: Yes    Partners: Male    Comment: Husband  Other Topics Concern   Not on file  Social History Narrative   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   benzonatate (TESSALON) 100 MG capsule Take 1 capsule (100 mg total) by mouth every 8 (eight) hours.   chlorpheniramine-HYDROcodone (TUSSIONEX PENNKINETIC ER) 10-8 MG/5ML SUER Take 5 mLs by mouth every 12 (twelve) hours as needed for cough.   ciprofloxacin-dexamethasone (CIPRODEX) OTIC suspension Place 4 drops into the right ear 2 (two) times daily. X4-7 days   clobetasol cream (TEMOVATE) 4.31 % Apply 1  Application topically 2 (two) times daily. Prn right lower leg and arms prn x up to 2 weeks   Continuous Blood Gluc Receiver (DEXCOM G6 RECEIVER) DEVI Use 1 receiver to check glucose every 6 hours.  Diagnosis code E16.2   Continuous Blood Gluc Receiver (FREESTYLE LIBRE 14 DAY READER) DEVI Use to check sugar 4 times daily   Continuous Blood Gluc Sensor (FREESTYLE LIBRE 14 DAY SENSOR) MISC Apply once every 14 days.   Continuous Blood Gluc Transmit (DEXCOM G6 TRANSMITTER) MISC Use 1 transmitter to check glucose every 6 hours.  Diagnosis code E16.2   Crisaborole (EUCRISA) 2 % OINT Apply 1 application topically in the morning and at bedtime. Apply to affected areas of hands   cyanocobalamin (,VITAMIN B-12,) 1000 MCG/ML injection Inject into the muscle.   dicyclomine (BENTYL) 20 MG tablet Take 1 tablet (20 mg total) by mouth 4 (four) times daily -  before meals and at bedtime. As needed   etanercept (ENBREL SURECLICK) 50 MG/ML injection Inject 50 mLs into the skin once a week. On friday   fexofenadine (ALLEGRA) 180 MG tablet TAKE ONE TABLET BY MOUTH DAILY   fluconazole (DIFLUCAN) 150 MG tablet Take 1 tablet (150 mg total) by mouth once for 1 dose. Repeat dose in 3 days prn   fluticasone (FLONASE) 50 MCG/ACT nasal spray Place 2 sprays into both nostrils daily.   fluticasone (FLONASE) 50 MCG/ACT nasal spray Place 2 sprays into both nostrils daily.   folic acid (FOLVITE) 1 MG tablet TAKE ONE TABLET BY MOUTH DAILY   hydrocortisone (ANUSOL-HC) 2.5 % rectal cream Place 1 application rectally 2 (two) times daily.   hydrocortisone 2.5 % cream Apply to affected areas twice daily as needed for up to 1 week. Can repeat in a month if needed   ketoconazole (NIZORAL) 2 % cream Apply to affected area twice daily as needed   levonorgestrel (LILETTA) 18.6 MCG/DAY IUD IUD by Intrauterine route.   methocarbamol (ROBAXIN) 750 MG tablet Take 1 tablet (750 mg total) by mouth every 8 (eight) hours as needed for muscle spasms.    montelukast (SINGULAIR) 10 MG tablet TAKE ONE TABLET BY MOUTH EVERY NIGHT AT BEDTIME   Multiple Vitamin (MULTI-VITAMINS) TABS Take by mouth.   nystatin-triamcinolone ointment (MYCOLOG) Apply 1 application topically 2 (two) times daily.   omeprazole (PRILOSEC) 40 MG capsule Take 1 capsule (40 mg total) by mouth daily. Take daily while taking prednisone to avoid gastric irritation from acid production.   ondansetron (ZOFRAN-ODT) 4 MG disintegrating tablet    predniSONE (DELTASONE) 10 MG tablet Take 4 tablets (40 mg) for four  days, then 2 tablets (20 mg) for four days, then 1 tablet (10 mg) for four days.   pregabalin (LYRICA) 50 MG capsule Take 1 capsule (50 mg total) by mouth 2 (two) times daily.   promethazine (PHENERGAN) 25 MG tablet Take 1 tablet (25 mg total) by mouth every 8 (eight) hours as needed for nausea or vomiting.   Semaglutide-Weight Management (WEGOVY) 0.5 MG/0.5ML SOAJ Inject 0.'5mg'$  under the skin once weekly   Semaglutide-Weight Management (WEGOVY) 1 MG/0.5ML SOAJ Inject 0.5 mL (1 mg total) under the skin every 7 days for 28 days.   Semaglutide-Weight Management (WEGOVY) 1.7 MG/0.75ML SOAJ Inject 0.75 mL (1.7 mg total) under the skin every 7 days for 28 days.   Semaglutide-Weight Management (WEGOVY) 2.4 MG/0.75ML SOAJ Inject 0.75 mL (2.4 mg total) under the skin every 7 days for 28 days.   solifenacin (VESICARE) 5 MG tablet Take 1 tablet (5 mg total) by mouth daily.   spironolactone (ALDACTONE) 25 MG tablet Take 25 mg by mouth daily.   SUMAtriptan (IMITREX) 50 MG tablet TAKE ONE TABLET BY MOUTH AT ONSET OF HEADACHE; MAY REPEAT ONE TABLET IN 2 HOURS IF NEEDED. NO MORE THAN 2 DOSES IN A 24 HOUR PERIOD   SUMAtriptan (IMITREX) 50 MG tablet Take 1 tablet (50 mg total) by mouth every 2 (two) hours as needed for migraine. May repeat in 2 hours if headache persists or recurs. No more than 2 doses in a 24 hour time span.   venlafaxine XR (EFFEXOR-XR) 150 MG 24 hr capsule TAKE ONE CAPSULE BY  MOUTH DAILY WITH BREAKFAST   [DISCONTINUED] clobetasol (OLUX) 0.05 % topical foam Apply to hands twice daily for up to 1 week then weekends only. Avoid applying to face, groin, and axilla. Use as directed. Risk of skin atrophy with long-term use reviewed.   [DISCONTINUED] Clobetasol Prop Emollient Base (CLOBETASOL PROPIONATE E) 0.05 % emollient cream Apply to skin once or twice a day as needed, avoid face, groin, axilla   Allergies  Allergen Reactions   Penicillins Rash and Swelling   Oxycodone-Acetaminophen Rash   Tramadol Anxiety and Itching   Codeine Other (See Comments)    Chest pains   Tape Hives   Recent Results (from the past 2160 hour(s))  Basic Metabolic Panel (BMET)     Status: None   Collection Time: 09/05/21  8:38 AM  Result Value Ref Range   Sodium 140 135 - 145 mEq/L   Potassium 4.1 3.5 - 5.1 mEq/L   Chloride 106 96 - 112 mEq/L   CO2 29 19 - 32 mEq/L   Glucose, Bld 86 70 - 99 mg/dL   BUN 13 6 - 23 mg/dL   Creatinine, Ser 0.41 0.40 - 1.20 mg/dL   GFR 119.76 >60.00 mL/min    Comment: Calculated using the CKD-EPI Creatinine Equation (2021)   Calcium 8.8 8.4 - 10.5 mg/dL  Magnesium     Status: None   Collection Time: 09/05/21  8:38 AM  Result Value Ref Range   Magnesium 1.8 1.5 - 2.5 mg/dL   Objective  Body mass index is 40.82 kg/m. Wt Readings from Last 3 Encounters:  11/09/21 260 lb 9.6 oz (118.2 kg)  09/05/21 255 lb 12.8 oz (116 kg)  05/03/21 257 lb 6.4 oz (116.8 kg)   Temp Readings from Last 3 Encounters:  11/09/21 98.2 F (36.8 C) (Oral)  11/01/21 98.2 F (36.8 C)  09/05/21 97.6 F (36.4 C) (Oral)   BP Readings from Last 3 Encounters:  11/09/21  118/68  11/01/21 125/85  09/05/21 100/70   Pulse Readings from Last 3 Encounters:  11/09/21 60  11/01/21 71  09/05/21 (!) 54    Physical Exam Vitals and nursing note reviewed.  Constitutional:      Appearance: Normal appearance. She is well-developed and well-groomed.  HENT:     Head: Normocephalic  and atraumatic.     Ears:     Comments: ETD b/l ear no infection Eyes:     Conjunctiva/sclera: Conjunctivae normal.     Pupils: Pupils are equal, round, and reactive to light.  Cardiovascular:     Rate and Rhythm: Normal rate and regular rhythm.     Heart sounds: Normal heart sounds. No murmur heard. Pulmonary:     Effort: Pulmonary effort is normal.     Breath sounds: Normal breath sounds.  Abdominal:     General: Abdomen is flat. Bowel sounds are normal.     Tenderness: There is no abdominal tenderness.  Musculoskeletal:        General: No tenderness.  Skin:    General: Skin is warm and dry.     Findings: Rash present. Rash is papular and purpuric.     Comments: Red papules right lower leg  Neurological:     General: No focal deficit present.     Mental Status: She is alert and oriented to person, place, and time. Mental status is at baseline.     Cranial Nerves: Cranial nerves 2-12 are intact.     Motor: Motor function is intact.     Coordination: Coordination is intact.     Gait: Gait is intact.  Psychiatric:        Attention and Perception: Attention and perception normal.        Mood and Affect: Mood and affect normal.        Speech: Speech normal.        Behavior: Behavior normal. Behavior is cooperative.        Thought Content: Thought content normal.        Cognition and Memory: Cognition and memory normal.        Judgment: Judgment normal.     Assessment  Plan  Right ear pain likely due to ETD - Plan: ciprofloxacin-dexamethasone (CIPRODEX) OTIC suspension Otalgia, right - Plan: ciprofloxacin-dexamethasone (CIPRODEX) OTIC suspension Dysfunction of both eustachian tubes Allegra and zyrtec  Nasal saline with glycerin  Add in flonase as needed  Consider ENT in the future   Dermatitis ? Hives after bites  Clobetasol  C/w  Yeast infection - Plan: fluconazole (DIFLUCAN) 150 MG tablet  Vaginal irritation before urination  -refer to urogyn Dr. Wannetta Sender  Pt  has gyn at [facility]   Provider: Dr. Olivia Mackie [facility]

## 2021-11-13 ENCOUNTER — Encounter: Payer: Self-pay | Admitting: Internal Medicine

## 2021-11-14 ENCOUNTER — Telehealth: Payer: Self-pay | Admitting: Internal Medicine

## 2021-11-14 NOTE — Addendum Note (Signed)
Addended by: Orland Mustard on: 11/14/2021 03:36 PM   Modules accepted: Orders

## 2021-11-14 NOTE — Telephone Encounter (Signed)
Noted! Thanks Dr. Olivia Mackie!!

## 2021-11-14 NOTE — Addendum Note (Signed)
Addended by: Orland Mustard on: 11/14/2021 03:37 PM   Modules accepted: Orders

## 2021-11-14 NOTE — Telephone Encounter (Signed)
Schedule dirty and clean urine lab appt only please  I saw her 11/09/21 and we discussed if diflucan did not help would have to do further urine studies so she was following up  Joycelyn Schmid thank you for covering

## 2021-11-15 ENCOUNTER — Other Ambulatory Visit (HOSPITAL_COMMUNITY)
Admission: RE | Admit: 2021-11-15 | Discharge: 2021-11-15 | Disposition: A | Discharge status: Home / Self Care | Payer: BC Managed Care – PPO | Source: Ambulatory Visit | Attending: Internal Medicine | Admitting: Internal Medicine

## 2021-11-15 DIAGNOSIS — N760 Acute vaginitis: Secondary | ICD-10-CM | POA: Diagnosis not present

## 2021-11-16 ENCOUNTER — Other Ambulatory Visit: Payer: BC Managed Care – PPO

## 2021-11-17 LAB — URINE CYTOLOGY ANCILLARY ONLY
Bacterial Vaginitis-Urine: NEGATIVE
Candida Urine: NEGATIVE
Chlamydia: NEGATIVE
Comment: NEGATIVE
Comment: NEGATIVE
Comment: NORMAL
Neisseria Gonorrhea: NEGATIVE
Trichomonas: NEGATIVE

## 2021-11-17 LAB — URINE CULTURE
MICRO NUMBER:: 13937836
SPECIMEN QUALITY:: ADEQUATE

## 2021-11-18 NOTE — Addendum Note (Signed)
Addended by: Orland Mustard on: 11/18/2021 04:18 PM   Modules accepted: Orders

## 2021-11-24 ENCOUNTER — Other Ambulatory Visit: Payer: Self-pay | Admitting: Family Medicine

## 2021-11-28 ENCOUNTER — Ambulatory Visit
Admission: EM | Admit: 2021-11-28 | Discharge: 2021-11-28 | Disposition: A | Discharge status: Home / Self Care | Payer: BC Managed Care – PPO | Attending: Emergency Medicine | Admitting: Emergency Medicine

## 2021-11-28 DIAGNOSIS — R499 Unspecified voice and resonance disorder: Secondary | ICD-10-CM | POA: Diagnosis not present

## 2021-11-28 DIAGNOSIS — H6691 Otitis media, unspecified, right ear: Secondary | ICD-10-CM

## 2021-11-28 LAB — POCT RAPID STREP A (OFFICE): Rapid Strep A Screen: NEGATIVE

## 2021-11-28 MED ORDER — CEFDINIR 300 MG PO CAPS: 300.0000 mg | ORAL_CAPSULE | Freq: Two times a day (BID) | ORAL | 0 refills | Status: AC

## 2021-11-28 NOTE — ED Provider Notes (Signed)
Jill Hawkins    CSN: [id number] Arrival date & time: 11/28/21  1720      History   Chief Complaint Chief Complaint  Patient presents with   Ear Fullness    Earache that has been going on for a while and scratchy voice that just started last week - Entered by patient    HPI Jill Hawkins is a 46 y.o. female.  Patient presents with 2-week history of right ear pain and scratchy voice.  She denies fever, chills, cough, shortness of breath, vomiting, diarrhea, or other symptoms.  Treatment attempted at home with allergy medications as well as prescribed eardrops.  Patient was seen by her PCP on 11/09/2021 for right ear pain and other diagnoses; treated with Ciprodex eardrops.  Her medical history includes hypertension, frequent headaches, fainting spells, GERD, kidney stones, anxiety, depression.  The history is provided by the patient and medical records.    Past Medical History:  Diagnosis Date   Frequent headaches    GERD (gastroesophageal reflux disease)    History of fainting spells of unknown cause    Hypertension    Kidney stones    Migraine    UTI (lower urinary tract infection)     Patient Active Problem List   Diagnosis Date Noted   Muscle cramps 09/05/2021   Cholelithiasis without obstruction 05/03/2021   Dysuria 05/03/2021   Gastrojejunal ulcer 05/03/2021   Iron deficiency 05/03/2021   Enteritis 05/03/2021   Toe pain 05/03/2021   Migraine 05/03/2021   Carpal tunnel syndrome 05/03/2021   Overactive bladder 02/02/2021   IDA (iron deficiency anemia) 08/23/2020   Palpitations 08/23/2020   Ingrown hair 08/23/2020   Bright red blood per rectum 05/10/2020   Lateral epicondylitis of right elbow 05/10/2020   Bilateral leg edema 05/10/2020   Labial lesion 03/10/2020   Breast pain, right 03/10/2020   COVID-19 02/24/2020   Positive anti-CCP test 07/10/2019   Right hip pain 06/05/2019   Decreased sex drive 10/93/2355   Osteopenia 06/05/2019   Obesity  (BMI 35.0-39.9 without comorbidity) 06/05/2019   GERD (gastroesophageal reflux disease) 11/30/2018   Skin lesion 11/30/2018   Wrist nodule 11/30/2018   Breast cancer screening 11/30/2018   Muscle strain of right scapular region 10/09/2018   Chronic use of opiate drug for therapeutic purpose 12/03/2017   Neck pain 12/03/2017   Hypoglycemic disorder 07/17/2017   Anxiety and depression 05/17/2017   Left leg swelling 11/17/2016   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   Allergic rhinitis 06/20/2016   Right shoulder pain 06/20/2016   Candidal intertrigo 06/20/2016   Hemorrhoids 06/20/2016   Soft tissue mass 06/20/2016   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   Nausea 04/18/2016   Left lower quadrant pain 11/18/2015   Encounter for general adult medical examination with abnormal findings 06/27/2015   Cyst of right ovary 04/14/2015   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      Home Medications  Prior to Admission medications   Medication Sig Start Date End Date Taking? Authorizing Provider  cefdinir (OMNICEF) 300 MG capsule Take 1 capsule (300 mg total) by mouth 2 (two) times daily for 7 days. 11/28/21 12/05/21 Yes Sharion Balloon, NP  ALPRAZolam Duanne Moron) 1 MG tablet TAKE 1 TABLET(1 MG) BY MOUTH DAILY AS NEEDED FOR ANXIETY 08/11/21   Leone Haven, MD  B Complex Vitamins (VITAMIN-B COMPLEX) TABS Take by mouth.    [provider]  benzonatate (TESSALON) 100 MG capsule Take 1 capsule (100 mg  total) by mouth every 8 (eight) hours. 02/23/21   Hughie Closs, PA-C  chlorpheniramine-HYDROcodone (TUSSIONEX PENNKINETIC ER) 10-8 MG/5ML SUER Take 5 mLs by mouth every 12 (twelve) hours as needed for cough. 02/24/20   Leone Haven, MD  ciprofloxacin-dexamethasone (CIPRODEX) OTIC suspension Place 4 drops into the right ear 2 (two) times daily. X4-7 days 11/09/21   McLean-Scocuzza, Nino Glow, MD  clobetasol cream (TEMOVATE) 2.11 % Apply 1 Application topically 2 (two) times daily. Prn right lower leg and arms prn x up to 2 weeks 11/09/21   McLean-Scocuzza, Nino Glow, MD  Continuous Blood Gluc Receiver (DEXCOM G6 RECEIVER) DEVI Use 1 receiver to check glucose every 6 hours.  Diagnosis code E16.2 11/11/19   Leone Haven, MD  Continuous Blood Gluc Receiver (FREESTYLE LIBRE 14 DAY READER) DEVI Use to check sugar 4 times daily 11/18/19   Leone Haven, MD  Continuous Blood Gluc Sensor (FREESTYLE LIBRE 14 DAY SENSOR) MISC Apply once every 14 days. 11/18/19   Leone Haven, MD  Continuous Blood Gluc Transmit (DEXCOM G6 TRANSMITTER) MISC Use 1 transmitter to check glucose every 6 hours.  Diagnosis code E16.2 11/11/19   Leone Haven, MD  Crisaborole (EUCRISA) 2 % OINT Apply 1 application topically in the morning and at bedtime. Apply to affected areas of hands 04/22/20   [facility], [place], MD  cyanocobalamin (,VITAMIN B-12,) 1000 MCG/ML injection Inject into the muscle. 05/19/21   [provider]  dicyclomine (BENTYL) 20 MG tablet Take 1 tablet (20 mg total) by mouth 4 (four) times daily -  before meals and at bedtime. As needed 04/30/21   McLean-Scocuzza, Nino Glow, MD  etanercept (ENBREL SURECLICK) 50 MG/ML injection Inject 50 mLs into the skin once a week. On friday 03/22/20   [provider]  fexofenadine (ALLEGRA) 180 MG tablet TAKE ONE TABLET BY MOUTH DAILY 06/13/21   Leone Haven, MD  fluticasone [facility]) 50 MCG/ACT nasal spray Place 2 sprays into both nostrils daily.  02/24/20   Leone Haven, MD  fluticasone (FLONASE) 50 MCG/ACT nasal spray Place 2 sprays into both nostrils daily. 02/23/21   Hughie Closs, PA-C  folic acid (FOLVITE) 1 MG tablet TAKE 1 TABLET BY MOUTH DAILY 11/25/21   Leone Haven, MD  hydrocortisone (ANUSOL-HC) 2.5 % rectal cream Place 1 application rectally 2 (two) times daily. 07/17/17   Leone Haven, MD  hydrocortisone 2.5 % cream Apply to affected areas twice daily as needed for up to 1 week. Can repeat in a month if needed 06/07/20   [facility], [place], MD  ketoconazole (NIZORAL) 2 % cream Apply to affected area twice daily as needed 06/07/20   [facility], [place], MD  levonorgestrel (LILETTA) 18.6 MCG/DAY IUD IUD by Intrauterine route.    [provider]  methocarbamol (ROBAXIN) 750 MG tablet Take 1 tablet (750 mg total) by mouth every 8 (eight) hours as needed for muscle spasms. 02/02/21  Leone Haven, MD  montelukast (SINGULAIR) 10 MG tablet TAKE ONE TABLET BY MOUTH EVERY NIGHT AT BEDTIME 08/13/20   Leone Haven, MD  Multiple Vitamin (MULTI-VITAMINS) TABS Take by mouth.    [provider]  nystatin-triamcinolone ointment (MYCOLOG) Apply 1 application topically 2 (two) times daily. 07/17/17   Leone Haven, MD  omeprazole (PRILOSEC) 20 MG capsule Take by mouth. 11/06/18 12/06/18  [provider]  omeprazole (PRILOSEC) 40 MG capsule Take 1 capsule (40 mg total) by mouth daily. Take daily while taking prednisone to avoid gastric irritation from acid production. 11/01/21 12/01/21  Immordino, Annie Main, FNP  ondansetron (ZOFRAN-ODT) 4 MG disintegrating tablet  04/27/16   [provider]  predniSONE (DELTASONE) 10 MG tablet Take 4 tablets (40 mg) for four days, then 2 tablets (20 mg) for four days, then 1 tablet (10 mg) for four days. 11/01/21   Immordino, Annie Main, FNP  pregabalin (LYRICA) 50 MG capsule Take 1 capsule (50 mg total) by mouth 2 (two) times daily. 09/05/21   Leone Haven, MD   promethazine (PHENERGAN) 25 MG tablet Take 1 tablet (25 mg total) by mouth every 8 (eight) hours as needed for nausea or vomiting. 04/29/21   McLean-Scocuzza, Nino Glow, MD  Semaglutide-Weight Management [facility]) 0.5 MG/0.5ML SOAJ Inject 0.'5mg'$  under the skin once weekly 07/05/21   Mardelle Matte, PA-C  Semaglutide-Weight Management (WEGOVY) 1 MG/0.5ML SOAJ Inject 0.5 mL (1 mg total) under the skin every 7 days for 28 days. 09/20/21     Semaglutide-Weight Management (WEGOVY) 1.7 MG/0.75ML SOAJ Inject 0.75 mL (1.7 mg total) under the skin every 7 days for 28 days. 09/20/21     Semaglutide-Weight Management (WEGOVY) 2.4 MG/0.75ML SOAJ Inject 0.75 mL (2.4 mg total) under the skin every 7 days for 28 days. 09/20/21     solifenacin (VESICARE) 5 MG tablet Take 1 tablet (5 mg total) by mouth daily. 05/03/21   Leone Haven, MD  spironolactone (ALDACTONE) 25 MG tablet Take 25 mg by mouth daily. 08/02/20   [provider]  SUMAtriptan (IMITREX) 50 MG tablet TAKE ONE TABLET BY MOUTH AT ONSET OF HEADACHE; MAY REPEAT ONE TABLET IN 2 HOURS IF NEEDED. NO MORE THAN 2 DOSES IN A 24 HOUR PERIOD 10/10/21   Leone Haven, MD  SUMAtriptan (IMITREX) 50 MG tablet Take 1 tablet (50 mg total) by mouth every 2 (two) hours as needed for migraine. May repeat in 2 hours if headache persists or recurs. No more than 2 doses in a 24 hour time span. 10/10/21   Leone Haven, MD  SUMAtriptan (IMITREX) 50 MG tablet TAKE 1 TABLET BY MOUTH AT ONSET OF HEADACHE; MAY REPEAT 1 TABLET IN 2 HOURS IF NEEDED. NO MORE THAN 2 DOSES IN A 24 HOUR PERIOD. 11/25/21   Leone Haven, MD  venlafaxine XR (EFFEXOR-XR) 150 MG 24 hr capsule TAKE ONE CAPSULE BY MOUTH DAILY WITH BREAKFAST 10/27/21   Dutch Quint B, FNP  buPROPion [facility]) 100 MG tablet TAKE ONE TABLET BY MOUTH THREE TIMES A DAY 10/03/19 02/23/20  Leone Haven, MD    Family History Family History  Problem Relation Age of Onset   Arthritis Mother    Hypertension Mother     Mental illness Mother    Diabetes Mother    Alcohol abuse Father    Cancer Father    Hypertension Father    Mental illness Brother    Heart disease Maternal Grandmother    Hypertension Maternal Grandmother  Mental illness Maternal Grandmother    Diabetes Maternal Grandmother    Cancer Maternal Grandfather    Hypertension Maternal Grandfather    Mental illness Paternal Grandmother     Social History Social History   Tobacco Use   Smoking status: Never   Smokeless tobacco: Never  Substance Use Topics   Alcohol use: No    Alcohol/week: 0.0 standard drinks of alcohol   Drug use: No     Allergies   Penicillins, Oxycodone-acetaminophen, Tramadol, Codeine, and Tape   Review of Systems Review of Systems  Constitutional:  Negative for chills and fever.  HENT:  Positive for ear pain and voice change. Negative for sore throat.   Respiratory:  Negative for cough and shortness of breath.   Gastrointestinal:  Negative for diarrhea and vomiting.  Skin:  Negative for color change and rash.  All other systems reviewed and are negative.    Physical Exam Triage Vital Signs ED Triage Vitals  Enc Vitals Group     BP 11/28/21 1727 127/80     Pulse Rate 11/28/21 1727 73     Resp 11/28/21 1727 18     Temp 11/28/21 1727 97.9 F (36.6 C)     Temp src --      SpO2 11/28/21 1727 96 %     Weight 11/28/21 1737 256 lb (116.1 kg)     Height 11/28/21 1737 '5\' 7"'$  (1.702 m)     Head Circumference --      Peak Flow --      Pain Score 11/28/21 1736 8     Pain Loc --      Pain Edu? --      Excl. in [place]? --    No data found.  Updated Vital Signs BP 127/80   Pulse 73   Temp 97.9 F (36.6 C)   Resp 18   Ht '5\' 7"'$  (1.702 m)   Wt 256 lb (116.1 kg)   SpO2 96%   BMI 40.10 kg/m   Visual Acuity Right Eye Distance:   Left Eye Distance:   Bilateral Distance:    Right Eye Near:   Left Eye Near:    Bilateral Near:     Physical Exam Vitals and nursing note reviewed.   Constitutional:      General: She is not in acute distress.    Appearance: Normal appearance. She is well-developed. She is not ill-appearing.  HENT:     Right Ear: Ear canal normal. Tympanic membrane is erythematous.     Left Ear: Tympanic membrane and ear canal normal.     Nose: Nose normal.     Mouth/Throat:     Mouth: Mucous membranes are moist.     Pharynx: Oropharynx is clear.  Cardiovascular:     Rate and Rhythm: Normal rate and regular rhythm.     Heart sounds: Normal heart sounds.  Pulmonary:     Effort: Pulmonary effort is normal. No respiratory distress.     Breath sounds: Normal breath sounds.  Musculoskeletal:     Cervical back: Neck supple.  Skin:    General: Skin is warm and dry.  Neurological:     Mental Status: She is alert.  Psychiatric:        Mood and Affect: Mood normal.        Behavior: Behavior normal.      UC Treatments / Results  Labs (all labs ordered are listed, but only abnormal results are displayed) Labs Reviewed  POCT RAPID STREP  A (OFFICE)    EKG   Radiology No results found.  Procedures Procedures (including critical care time)  Medications Ordered in UC Medications - No data to display  Initial Impression / Assessment and Plan / UC Course  I have reviewed the triage vital signs and the nursing notes.  Pertinent labs & imaging results that were available during my care of the patient were reviewed by me and considered in my medical decision making (see chart for details).   Right otitis media.  Patient is allergic to penicillin but states she has taken cephalosporins in the past without problem.  Treating today with cefdinir.  Tylenol as needed for discomfort.  Instructed patient to follow up with her PCP if her symptoms are not improving.  She agrees to plan of care.     Final Clinical Impressions(s) / UC Diagnoses   Final diagnoses:  Right otitis media, unspecified otitis media type     Discharge Instructions       Take the antibiotic as directed.  Follow up with your primary care provider if your symptoms are not improving.        ED Prescriptions     Medication Sig Dispense Auth. Provider   cefdinir (OMNICEF) 300 MG capsule Take 1 capsule (300 mg total) by mouth 2 (two) times daily for 7 days. 14 capsule Sharion Balloon, NP      PDMP not reviewed this encounter.   Sharion Balloon, NP 11/28/21 [phone]

## 2021-11-28 NOTE — Discharge Instructions (Addendum)
Take the antibiotic as directed.  Follow up with your primary care provider if your symptoms are not improving.     

## 2021-11-28 NOTE — ED Triage Notes (Addendum)
Patient to Urgent Care with complaints of earache and "scratchy voice".   Reports seeing her pcp approx a week ago for her right sided ear ache, was prescribed some ear drops but these haven't helped. Reports that her ear feels constantly wet and full. When she applies the ear drops (ciprodex) she reports sharp pain.   Has been taking 2 types of allergy medications. Sore throat that started Friday.

## 2021-12-06 ENCOUNTER — Encounter: Payer: Self-pay | Admitting: Obstetrics and Gynecology

## 2021-12-06 ENCOUNTER — Ambulatory Visit (INDEPENDENT_AMBULATORY_CARE_PROVIDER_SITE_OTHER): Payer: BC Managed Care – PPO | Admitting: Obstetrics and Gynecology

## 2021-12-06 VITALS — BP 144/89 | HR 64 | Ht 67.53 in | Wt 257.0 lb

## 2021-12-06 DIAGNOSIS — R350 Frequency of micturition: Secondary | ICD-10-CM

## 2021-12-06 DIAGNOSIS — N898 Other specified noninflammatory disorders of vagina: Secondary | ICD-10-CM

## 2021-12-06 DIAGNOSIS — N3941 Urge incontinence: Secondary | ICD-10-CM | POA: Diagnosis not present

## 2021-12-06 DIAGNOSIS — Z01419 Encounter for gynecological examination (general) (routine) without abnormal findings: Secondary | ICD-10-CM

## 2021-12-06 LAB — POCT URINALYSIS DIPSTICK
Bilirubin, UA: NEGATIVE
Blood, UA: NEGATIVE
Glucose, UA: NEGATIVE
Ketones, UA: NEGATIVE
Leukocytes, UA: NEGATIVE
Nitrite, UA: NEGATIVE
Protein, UA: NEGATIVE
Spec Grav, UA: >=1.03 — AB (ref 1.010–1.025)
Urobilinogen, UA: 1 E.U./dL
pH, UA: 5 (ref 5.0–8.0)

## 2021-12-06 MED ORDER — ESTRADIOL 0.1 MG/GM VA CREA: 0.5000 g | TOPICAL_CREAM | VAGINAL | 11 refills | Status: DC

## 2021-12-06 NOTE — Progress Notes (Signed)
[facility] New Patient Evaluation and Consultation  Referring Provider: McLean-Scocuzza, Olivia Mackie * PCP: Leone Haven, MD Date of Service: 12/06/2021  SUBJECTIVE Chief Complaint: New Patient (Initial Visit) (Jill Hawkins is a 46 y.o. female here for a consult for recurrent UTI sx./)  History of Present Illness: Jill Hawkins is a 46 y.o. White or Caucasian female seen in consultation at the request of Dr. Terese Door for evaluation of vaginal itching/ irritation.    Review of records significant for: Negative for BV, yeast infection. Negative urine culture 11/15/21  Urinary Symptoms:  Sometimes with have some leakage with urgency on the way to the bathroom- really in the morning.  Was once prescribed vesicare but did not take it. Myrbetriq was not covered.   Day time voids 4- 5.  Nocturia: 1 times per night to void. Voiding dysfunction: she empties her bladder well.  does not use a catheter to empty bladder.  When urinating, she feels the need to urinate multiple times in a row Drinks: coffee in AM, then water throughout the day, occasional diet drink  UTIs:  0  UTI's in the last year.   Reports history of kidney or bladder stones- not recent  Pelvic Organ Prolapse Symptoms:                  She Denies a feeling of a bulge the vaginal area.   Bowel Symptom: Bowel movements: 3-4 time(s) per day Stool consistency: soft  Straining: yes.  Splinting: no.  Incomplete evacuation: no.  She Denies accidental bowel leakage / fecal incontinence Bowel regimen: none   Sexual Function Sexually active: yes.  Sexual orientation: Straight Pain with sex: Yes, has discomfort due to dryness She is not using lubrication with sex.  Has low sex drive, only has intercourse every few months.   Pelvic Pain Denies pelvic pain  Started having itching and did two rounds of diflucan. Has been going on for about a month. Has some urgency and burning when she has  bladder urgency, but not when she actually urinates.   She did two rounds of diflucan and did not see improvement of her symptoms. Has improved somewhat on her own.   Past Medical History:  Past Medical History:  Diagnosis Date   Frequent headaches    GERD (gastroesophageal reflux disease)    History of fainting spells of unknown cause    Hypertension    Kidney stones    Migraine    UTI (lower urinary tract infection)      Past Surgical History:   Past Surgical History:  Procedure Laterality Date   CARPAL TUNNEL RELEASE  2005   CHOLECYSTECTOMY  2015   GASTRIC BYPASS  04/14/13   GROWTH PLATE SURGERY  8841   Right hip     Past OB/GYN History: OB History  Gravida Para Term Preterm AB Living  '2 2 2     2  '$ SAB IAB Ectopic Multiple Live Births          2    # Outcome Date GA Lbr Len/2nd Weight Sex Delivery Anes PTL Lv  2 Term      CS-Unspec     1 Term      CS-Unspec       Menopausal: No Contraception: IUD. Last pap smear was 2022 per patient.     Medications: She has a current medication list which includes the following prescription(s): alprazolam, vitamin-b complex, clobetasol cream, dexcom g6 receiver, freestyle libre 14 day  reader, freestyle libre 14 day sensor, dexcom g6 transmitter, eucrisa, cyanocobalamin, [START ON 12/08/2021] estradiol, enbrel sureclick, fexofenadine, fluticasone, folic acid, hydrocortisone, hydrocortisone, ketoconazole, levonorgestrel, montelukast, multi-vitamins, nystatin-triamcinolone ointment, ondansetron, pregabalin, promethazine, wegovy, solifenacin, spironolactone, sumatriptan, sumatriptan, venlafaxine xr, and [DISCONTINUED] bupropion.   Allergies: Patient is allergic to penicillins, oxycodone-acetaminophen, tramadol, codeine, and tape.   Social History:  Social History   Tobacco Use   Smoking status: Never   Smokeless tobacco: Never  Vaping Use   Vaping Use: Never used  Substance Use Topics   Alcohol use: No    Alcohol/week: 0.0  standard drinks of alcohol   Drug use: No    Relationship status: married She lives with husband, son.   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 Regular exercise: No History of abuse: No  Family History:   Family History  Problem Relation Age of Onset   Arthritis Mother    Hypertension Mother    Mental illness Mother    Diabetes Mother    Alcohol abuse Father    Cancer Father    Hypertension Father    Mental illness Brother    Heart disease Maternal Grandmother    Hypertension Maternal Grandmother    Mental illness Maternal Grandmother    Diabetes Maternal Grandmother    Cancer Maternal Grandfather    Hypertension Maternal Grandfather    Mental illness Paternal Grandmother      Review of Systems: Review of Systems  Constitutional:  Negative for fever, malaise/fatigue and weight loss.  Respiratory:  Negative for cough, shortness of breath and wheezing.   Cardiovascular:  Positive for leg swelling. Negative for chest pain and palpitations.  Gastrointestinal:  Negative for abdominal pain and blood in stool.  Genitourinary:  Positive for dysuria.  Musculoskeletal:  Negative for myalgias.  Skin:  Negative for rash.  Neurological:  Positive for headaches. Negative for dizziness.  Endo/Heme/Allergies:  Does not bruise/bleed easily.  Psychiatric/Behavioral:  Negative for depression. The patient is nervous/anxious.      OBJECTIVE Physical Exam: Vitals:   12/06/21 1056  BP: (!) 144/89  Pulse: 64  Weight: 257 lb (116.6 kg)  Height: 5' 7.53" (1.715 m)    Physical Exam Constitutional:      General: She is not in acute distress. Pulmonary:     Effort: Pulmonary effort is normal.  Abdominal:     General: There is no distension.     Palpations: Abdomen is soft.     Tenderness: There is no abdominal tenderness. There is no rebound.  Musculoskeletal:        General: No swelling. Normal range of motion.  Skin:    General: Skin is  warm and dry.     Findings: No rash.  Neurological:     Mental Status: She is alert and oriented to person, place, and time.  Psychiatric:        Mood and Affect: Mood normal.        Behavior: Behavior normal.      GU / Detailed Urogynecologic Evaluation:  Pelvic Exam: Normal external female genitalia, no skin changes or lesions present; Bartholin's and Skene's glands normal in appearance; urethral meatus normal in appearance, no urethral masses or discharge.   CST: negative  Speculum exam reveals normal vaginal mucosa without atrophy. Cervix normal appearance, small brown blood in vaginal vault.  Uterus normal single, nontender. Adnexa no mass, fullness, tenderness.     Pelvic floor strength I/V  Pelvic floor musculature: Right levator non-tender,  Right obturator non-tender, Left levator non-tender, Left obturator non-tender  POP-Q:   POP-Q  -3                                            Aa   -3                                           Ba  -7                                              C   2.5                                            Gh  5.5                                            Pb  9                                            tvl   -3                                            Ap  -3                                            Bp  -9                                              D     Rectal Exam:  Normal external rectum  Post-Void Residual (PVR) by Bladder Scan: In order to evaluate bladder emptying, we discussed obtaining a postvoid residual and she agreed to this procedure.  Procedure: The ultrasound unit was placed on the patient's abdomen in the suprapubic region after the patient had voided. A PVR of 17 ml was obtained by bladder scan.  Laboratory Results: POC urine: negative   ASSESSMENT AND PLAN Ms. Matson is a 46 y.o. with:  1. Urge incontinence   2. Urinary frequency   3. Vaginal dryness   4. Well woman exam    Urge  incontinence - This is only happening in the morning on the way to the bathroom but otherwise does not have leakage or urgency throughout the day.  - Recommended she state with pelvic PT- referral placed to [facility] rehab. If no improvement, then can try medications.   2. Vaginal dryness - suspect itching/ irritation is due to  dryness. No signs of discharge or vulvar/ vaginal lesions on exam today.  - Prescribed estrace cream 0.5g nightly for two weeks then twice a week after.  - Also recommended using vitamin E cream or coconut oil daily on the vulva for vaginal moisture.   - Needs new Gynecologist for well woman exam- referral placed to general OBGYN, Dr Sabra Heck.   Return as needed  Jaquita Folds, MD   Medical Decision Making:  - Reviewed/ ordered a clinical laboratory test - Review and summation of prior records

## 2021-12-06 NOTE — Patient Instructions (Signed)
Start vaginal estrogen therapy nightly for two weeks then 2 times weekly at night for treatment of vaginal atrophy (dryness of the vaginal tissues).  Please let us know if the prescription is too expensive and we can look for alternative options.  ° ° °Vulvovaginal moisturizer Options: °Vitamin E oil (pump or capsule) or cream (Gene's Vit E Cream) °Coconut oil °Silicone-based lubricant for use during intercourse ("wet platinum" is a brand available at most drugstores) °[place] °Consider the ingredients of the product - the fewer the ingredients the better! ° °Directions for Use: °Clean and dry your hands °Gently dab the vulvar/vaginal area dry as needed °Apply a “pea-sized” amount of the moisturizer onto your fingertip °Using you other hand, open the labia  °Apply the moisturizer to the vulvar/vaginal tissues °Wear loose fitting underwear/clothing if possible following application °Use moisturize up to 3 times daily as desired. ° °

## 2021-12-13 ENCOUNTER — Ambulatory Visit: Payer: BC Managed Care – PPO | Admitting: Family Medicine

## 2022-01-01 ENCOUNTER — Other Ambulatory Visit: Payer: Self-pay | Admitting: Family Medicine

## 2022-01-01 DIAGNOSIS — M5412 Radiculopathy, cervical region: Secondary | ICD-10-CM

## 2022-01-03 NOTE — Telephone Encounter (Signed)
LVM to call back to office .Wanted to check and see if she had seen uro/gyn in [place] yet?

## 2022-01-04 NOTE — Telephone Encounter (Signed)
Spoke to pt and she had appt already with Dr Windy Canny and she has f/up appt with Caryl Bis in Dec!

## 2022-01-25 ENCOUNTER — Other Ambulatory Visit: Payer: Self-pay

## 2022-01-25 MED ORDER — WEGOVY 0.25 MG/0.5ML ~~LOC~~ SOAJ
0.2500 mg | SUBCUTANEOUS | 0 refills | Status: DC
  Filled 2022-01-30: qty 2, 28d supply, fill #0
  Filled 2022-02-16: qty 2, fill #0
  Filled 2022-03-02 – 2022-04-17 (×3): qty 2, 28d supply, fill #0

## 2022-01-30 ENCOUNTER — Other Ambulatory Visit: Payer: Self-pay

## 2022-01-30 ENCOUNTER — Encounter: Payer: Self-pay | Admitting: Family Medicine

## 2022-01-30 ENCOUNTER — Ambulatory Visit (INDEPENDENT_AMBULATORY_CARE_PROVIDER_SITE_OTHER): Payer: BC Managed Care – PPO | Admitting: Family Medicine

## 2022-01-30 VITALS — BP 132/80 | HR 70 | Temp 97.9°F | Ht 67.53 in | Wt 263.4 lb

## 2022-01-30 DIAGNOSIS — J309 Allergic rhinitis, unspecified: Secondary | ICD-10-CM | POA: Diagnosis not present

## 2022-01-30 DIAGNOSIS — F32A Depression, unspecified: Secondary | ICD-10-CM

## 2022-01-30 DIAGNOSIS — R6889 Other general symptoms and signs: Secondary | ICD-10-CM | POA: Diagnosis not present

## 2022-01-30 DIAGNOSIS — D509 Iron deficiency anemia, unspecified: Secondary | ICD-10-CM | POA: Diagnosis not present

## 2022-01-30 DIAGNOSIS — F419 Anxiety disorder, unspecified: Secondary | ICD-10-CM | POA: Diagnosis not present

## 2022-01-30 MED ORDER — MOMETASONE FUROATE 50 MCG/ACT NA SUSP: 2.0000 | Freq: Every day | NASAL | 12 refills | Status: DC

## 2022-01-30 NOTE — Progress Notes (Signed)
[name], MD Phone: [phone]  Jill Hawkins is a 46 y.o. female who presents today for follow-up.  Anxiety/depression: Patient notes this is stable and adequately controlled.  She notes most of what she has is stress related.  She only uses Xanax when she travels.  Effexor has been helpful.  Iron deficiency anemia: Patient reports she saw her GI physician at [facility].  They wanted her labs rechecked and those will be done today.  Sensation of feeling hot: Patient reports this has been occurring at night.  She will wake up middle the night.  No drenching night sweats.  No hot flash symptoms.  She wonders if she could be going through menopause as she notes she has an IUD so does not know if she is still having menstrual cycles.  She has chronic issues of tiredness and lack of sex drive.  Nosebleeds: Patient has had a couple of nosebleeds here and there.  She occasionally uses Flonase.  She is able to get the nosebleeds to stop.  Social History   Tobacco Use  Smoking Status Never  Smokeless Tobacco Never    Current Outpatient Medications on File Prior to Visit  Medication Sig Dispense Refill   ALPRAZolam (XANAX) 1 MG tablet TAKE 1 TABLET(1 MG) BY MOUTH DAILY AS NEEDED FOR ANXIETY 15 tablet 0   B Complex Vitamins (VITAMIN-B COMPLEX) TABS Take by mouth.     clobetasol cream (TEMOVATE) 6.01 % Apply 1 Application topically 2 (two) times daily. Prn right lower leg and arms prn x up to 2 weeks 60 g 1   Continuous Blood Gluc Receiver (DEXCOM G6 RECEIVER) DEVI Use 1 receiver to check glucose every 6 hours.  Diagnosis code E16.2 1 each 0   Continuous Blood Gluc Receiver (FREESTYLE LIBRE 14 DAY READER) DEVI Use to check sugar 4 times daily 1 each 0   Continuous Blood Gluc Sensor (FREESTYLE LIBRE 14 DAY SENSOR) MISC Apply once every 14 days. 6 each 1   Continuous Blood Gluc Transmit (DEXCOM G6 TRANSMITTER) MISC Use 1 transmitter to check glucose every 6 hours.  Diagnosis code E16.2 1 each 0    Crisaborole (EUCRISA) 2 % OINT Apply 1 application topically in the morning and at bedtime. Apply to affected areas of hands 60 g 11   cyanocobalamin (,VITAMIN B-12,) 1000 MCG/ML injection Inject into the muscle.     estradiol (ESTRACE) 0.1 MG/GM vaginal cream Place 0.5 g vaginally 2 (two) times a week. Place 0.5g nightly for two weeks then twice a week after 30 g 11   etanercept (ENBREL SURECLICK) 50 MG/ML injection Inject 50 mLs into the skin once a week. On friday     fexofenadine (ALLEGRA) 180 MG tablet TAKE ONE TABLET BY MOUTH DAILY 90 tablet 1   folic acid (FOLVITE) 1 MG tablet TAKE 1 TABLET BY MOUTH DAILY 90 tablet 0   hydrocortisone (ANUSOL-HC) 2.5 % rectal cream Place 1 application rectally 2 (two) times daily. 30 g 0   hydrocortisone 2.5 % cream Apply to affected areas twice daily as needed for up to 1 week. Can repeat in a month if needed 28 g 3   ketoconazole (NIZORAL) 2 % cream Apply to affected area twice daily as needed 30 g 7   levonorgestrel (LILETTA) 18.6 MCG/DAY IUD IUD by Intrauterine route.     montelukast (SINGULAIR) 10 MG tablet TAKE ONE TABLET BY MOUTH EVERY NIGHT AT BEDTIME 30 tablet 2   Multiple Vitamin (MULTI-VITAMINS) TABS Take by mouth.  nystatin-triamcinolone ointment (MYCOLOG) Apply 1 application topically 2 (two) times daily. 30 g 0   ondansetron (ZOFRAN-ODT) 4 MG disintegrating tablet      pregabalin (LYRICA) 50 MG capsule TAKE 1 CAPSULE BY MOUTH TWICE A DAY 60 capsule 2   promethazine (PHENERGAN) 25 MG tablet Take 1 tablet (25 mg total) by mouth every 8 (eight) hours as needed for nausea or vomiting. 40 tablet 0   Semaglutide-Weight Management (WEGOVY) 0.25 MG/0.5ML SOAJ Inject 0.5 mL (0.25 mg total) under the skin every 7 days for 28 days. 2 mL 0   solifenacin (VESICARE) 5 MG tablet Take 1 tablet (5 mg total) by mouth daily. 90 tablet 1   spironolactone (ALDACTONE) 25 MG tablet Take 25 mg by mouth daily.     SUMAtriptan (IMITREX) 50 MG tablet TAKE ONE  TABLET BY MOUTH AT ONSET OF HEADACHE; MAY REPEAT ONE TABLET IN 2 HOURS IF NEEDED. NO MORE THAN 2 DOSES IN A 24 HOUR PERIOD 9 tablet 0   SUMAtriptan (IMITREX) 50 MG tablet Take 1 tablet (50 mg total) by mouth every 2 (two) hours as needed for migraine. May repeat in 2 hours if headache persists or recurs. No more than 2 doses in a 24 hour time span. 10 tablet 0   venlafaxine XR (EFFEXOR-XR) 150 MG 24 hr capsule TAKE ONE CAPSULE BY MOUTH DAILY WITH BREAKFAST 90 capsule 1   [DISCONTINUED] buPROPion (WELLBUTRIN) 100 MG tablet TAKE ONE TABLET BY MOUTH THREE TIMES A DAY 90 tablet 0   No current facility-administered medications on file prior to visit.     ROS see history of present illness  Objective  Physical Exam Vitals:   01/30/22 1619 01/30/22 1620  BP: 138/84 132/80  Pulse: 70   Temp: 97.9 F (36.6 C)   SpO2: 99%     BP Readings from Last 3 Encounters:  01/30/22 132/80  12/06/21 (!) 144/89  11/28/21 127/80   Wt Readings from Last 3 Encounters:  01/30/22 263 lb 6.4 oz (119.5 kg)  12/06/21 257 lb (116.6 kg)  11/28/21 256 lb (116.1 kg)    Physical Exam Constitutional:      General: She is not in acute distress.    Appearance: She is not diaphoretic.  Cardiovascular:     Rate and Rhythm: Normal rate and regular rhythm.     Heart sounds: Normal heart sounds.  Pulmonary:     Effort: Pulmonary effort is normal.     Breath sounds: Normal breath sounds.  Skin:    General: Skin is warm and dry.  Neurological:     Mental Status: She is alert.      Assessment/Plan: Please see individual problem list.  Problem List Items Addressed This Visit     Anxiety and depression (Chronic)    Well-controlled.  She will continue Effexor 150 mg daily.      IDA (iron deficiency anemia) (Chronic)    Recheck labs today.      Relevant Orders   CBC   IBC + Ferritin   Allergic rhinitis    She will trial Nasonex in place of Flonase to see if that will limit her risk of nosebleeds.       Sensation of feeling hot - Primary    Check FSH to evaluate for menopause.  Will also check a TSH.      Relevant Orders   FSH   TSH    Return in about 6 months (around 08/01/2022).   [name], MD [facility] -  [employer]

## 2022-01-30 NOTE — Assessment & Plan Note (Signed)
She will trial Nasonex in place of Flonase to see if that will limit her risk of nosebleeds.

## 2022-01-30 NOTE — Patient Instructions (Signed)
Nice to see you. Please try the nasonex for your allergies. We will get labs and contact you with the results.

## 2022-01-30 NOTE — Assessment & Plan Note (Signed)
Well-controlled.  She will continue Effexor 150 mg daily.

## 2022-01-30 NOTE — Assessment & Plan Note (Signed)
Check FSH to evaluate for menopause.  Will also check a TSH.

## 2022-01-30 NOTE — Assessment & Plan Note (Signed)
Recheck labs today. 

## 2022-01-31 LAB — IBC + FERRITIN
Ferritin: 18.1 ng/mL (ref 10.0–291.0)
Iron: 56 ug/dL (ref 42–145)
Saturation Ratios: 11.8 % — ABNORMAL LOW (ref 20.0–50.0)
TIBC: 473.2 ug/dL — ABNORMAL HIGH (ref 250.0–450.0)
Transferrin: 338 mg/dL (ref 212.0–360.0)

## 2022-01-31 LAB — FOLLICLE STIMULATING HORMONE: FSH: 14.8 m[IU]/mL

## 2022-01-31 LAB — CBC
HCT: 33.6 % — ABNORMAL LOW (ref 36.0–46.0)
Hemoglobin: 11.3 g/dL — ABNORMAL LOW (ref 12.0–15.0)
MCHC: 33.7 g/dL (ref 30.0–36.0)
MCV: 91.1 fl (ref 78.0–100.0)
Platelets: 158 10*3/uL (ref 150.0–400.0)
RBC: 3.69 Mil/uL — ABNORMAL LOW (ref 3.87–5.11)
RDW: 13.3 % (ref 11.5–15.5)
WBC: 3.9 10*3/uL — ABNORMAL LOW (ref 4.0–10.5)

## 2022-01-31 LAB — TSH: TSH: 0.95 u[IU]/mL (ref 0.35–5.50)

## 2022-02-01 ENCOUNTER — Other Ambulatory Visit: Payer: Self-pay

## 2022-02-16 ENCOUNTER — Other Ambulatory Visit: Payer: Self-pay

## 2022-02-22 ENCOUNTER — Other Ambulatory Visit: Payer: Self-pay | Admitting: Family Medicine

## 2022-02-22 ENCOUNTER — Encounter: Payer: Self-pay | Admitting: Family Medicine

## 2022-02-22 ENCOUNTER — Telehealth: Payer: BC Managed Care – PPO | Admitting: Family Medicine

## 2022-02-22 DIAGNOSIS — J309 Allergic rhinitis, unspecified: Secondary | ICD-10-CM

## 2022-03-02 ENCOUNTER — Encounter: Payer: Self-pay | Admitting: Family Medicine

## 2022-03-02 ENCOUNTER — Other Ambulatory Visit: Payer: Self-pay

## 2022-03-02 ENCOUNTER — Ambulatory Visit (INDEPENDENT_AMBULATORY_CARE_PROVIDER_SITE_OTHER): Payer: BC Managed Care – PPO | Admitting: Family Medicine

## 2022-03-02 VITALS — BP 118/70 | HR 82 | Temp 98.0°F | Ht 67.0 in | Wt 271.2 lb

## 2022-03-02 DIAGNOSIS — E669 Obesity, unspecified: Secondary | ICD-10-CM | POA: Diagnosis not present

## 2022-03-02 DIAGNOSIS — G479 Sleep disorder, unspecified: Secondary | ICD-10-CM

## 2022-03-02 DIAGNOSIS — L918 Other hypertrophic disorders of the skin: Secondary | ICD-10-CM

## 2022-03-02 MED ORDER — TRAZODONE HCL 50 MG PO TABS: 25.0000 mg | ORAL_TABLET | Freq: Every evening | ORAL | 3 refills | Status: DC | PRN

## 2022-03-02 NOTE — Assessment & Plan Note (Signed)
Chronic issue.  Discussed not looking at screens the hour before bed.  Will trial trazodone and see if this will help.  Discussed the risk of excessive drowsiness with this and if she has excessive drowsiness the day after taking this she will discontinue it and let us know.

## 2022-03-02 NOTE — Assessment & Plan Note (Signed)
Chronic issue.  Discussed that we do not have Wegovy samples.  I encouraged her to contact local pharmacies in the next few weeks to see if they have this back in stock.

## 2022-03-02 NOTE — Patient Instructions (Signed)
Nice to see you. We will try trazodone to help with your sleep.  If it makes you excessively drowsy the next day please let us know.

## 2022-03-02 NOTE — Progress Notes (Signed)
[name], MD Phone: [phone]  Jill Hawkins is a 47 y.o. female who presents today for f/u.  Sleeping difficulty: Patient notes this has been an ongoing issue.  She goes to bed at 11 PM and wakes up around 7 AM.  Sometimes it takes several hours to fall asleep and other times she wakes up multiple times at night.  She does look at a screen within the hour before bed.  She is cut out late day caffeine.  She rarely drinks alcohol.  She notes no depression or anxiety.  She is been trying melatonin and this does help her fall asleep but does not help her stay asleep.  She has trouble shutting her mind off at night.  Rectal skin tag: She wonders if she needs to see to have this removed.  She notes it was noted on her most recent colonoscopy.  Obesity: Patient notes her bariatric surgeon prescribed Wegovy.  She had an issue with the 0.25 mg pin and was not able to get this refilled before the national shortage.  She wonders if we have any samples.  Social History   Tobacco Use  Smoking Status Never  Smokeless Tobacco Never    Current Outpatient Medications on File Prior to Visit  Medication Sig Dispense Refill   ALPRAZolam (XANAX) 1 MG tablet TAKE 1 TABLET(1 MG) BY MOUTH DAILY AS NEEDED FOR ANXIETY 15 tablet 0   B Complex Vitamins (VITAMIN-B COMPLEX) TABS Take by mouth.     clobetasol cream (TEMOVATE) 7.84 % Apply 1 Application topically 2 (two) times daily. Prn right lower leg and arms prn x up to 2 weeks 60 g 1   Continuous Blood Gluc Receiver (DEXCOM G6 RECEIVER) DEVI Use 1 receiver to check glucose every 6 hours.  Diagnosis code E16.2 1 each 0   Continuous Blood Gluc Receiver (FREESTYLE LIBRE 14 DAY READER) DEVI Use to check sugar 4 times daily 1 each 0   Continuous Blood Gluc Sensor (FREESTYLE LIBRE 14 DAY SENSOR) MISC Apply once every 14 days. 6 each 1   Continuous Blood Gluc Transmit (DEXCOM G6 TRANSMITTER) MISC Use 1 transmitter to check glucose every 6 hours.  Diagnosis  code E16.2 1 each 0   Crisaborole (EUCRISA) 2 % OINT Apply 1 application topically in the morning and at bedtime. Apply to affected areas of hands 60 g 11   cyanocobalamin (,VITAMIN B-12,) 1000 MCG/ML injection Inject into the muscle.     estradiol (ESTRACE) 0.1 MG/GM vaginal cream Place 0.5 g vaginally 2 (two) times a week. Place 0.5g nightly for two weeks then twice a week after 30 g 11   etanercept (ENBREL SURECLICK) 50 MG/ML injection Inject 50 mLs into the skin once a week. On friday     fexofenadine (ALLEGRA) 180 MG tablet TAKE ONE TABLET BY MOUTH DAILY 90 tablet 1   folic acid (FOLVITE) 1 MG tablet TAKE 1 TABLET BY MOUTH DAILY 90 tablet 0   hydrocortisone (ANUSOL-HC) 2.5 % rectal cream Place 1 application rectally 2 (two) times daily. 30 g 0   hydrocortisone 2.5 % cream Apply to affected areas twice daily as needed for up to 1 week. Can repeat in a month if needed 28 g 3   ketoconazole (NIZORAL) 2 % cream Apply to affected area twice daily as needed 30 g 7   levonorgestrel (LILETTA) 18.6 MCG/DAY IUD IUD by Intrauterine route.     mometasone (NASONEX) 50 MCG/ACT nasal spray Place 2 sprays into the nose daily. 1  each 12   montelukast (SINGULAIR) 10 MG tablet TAKE ONE TABLET BY MOUTH EVERY NIGHT AT BEDTIME 30 tablet 2   Multiple Vitamin (MULTI-VITAMINS) TABS Take by mouth.     nystatin-triamcinolone ointment (MYCOLOG) Apply 1 application topically 2 (two) times daily. 30 g 0   ondansetron (ZOFRAN-ODT) 4 MG disintegrating tablet      pregabalin (LYRICA) 50 MG capsule TAKE 1 CAPSULE BY MOUTH TWICE A DAY 60 capsule 2   promethazine (PHENERGAN) 25 MG tablet Take 1 tablet (25 mg total) by mouth every 8 (eight) hours as needed for nausea or vomiting. 40 tablet 0   Semaglutide-Weight Management (WEGOVY) 0.25 MG/0.5ML SOAJ Inject 0.5 mL (0.25 mg total) under the skin every 7 days for 28 days. 2 mL 0   solifenacin (VESICARE) 5 MG tablet Take 1 tablet (5 mg total) by mouth daily. 90 tablet 1    spironolactone (ALDACTONE) 25 MG tablet Take 25 mg by mouth daily.     SUMAtriptan (IMITREX) 50 MG tablet TAKE ONE TABLET BY MOUTH AT ONSET OF HEADACHE; MAY REPEAT ONE TABLET IN 2 HOURS IF NEEDED. NO MORE THAN 2 DOSES IN A 24 HOUR PERIOD 9 tablet 0   SUMAtriptan (IMITREX) 50 MG tablet Take 1 tablet (50 mg total) by mouth every 2 (two) hours as needed for migraine. May repeat in 2 hours if headache persists or recurs. No more than 2 doses in a 24 hour time span. 10 tablet 0   venlafaxine XR (EFFEXOR-XR) 150 MG 24 hr capsule TAKE ONE CAPSULE BY MOUTH DAILY WITH BREAKFAST 90 capsule 1   [DISCONTINUED] buPROPion (WELLBUTRIN) 100 MG tablet TAKE ONE TABLET BY MOUTH THREE TIMES A DAY 90 tablet 0   No current facility-administered medications on file prior to visit.     ROS see history of present illness  Objective  Physical Exam Vitals:   03/02/22 1639  BP: 118/70  Pulse: 82  Temp: 98 F (36.7 C)  SpO2: 99%    BP Readings from Last 3 Encounters:  03/02/22 118/70  01/30/22 132/80  12/06/21 (!) 144/89   Wt Readings from Last 3 Encounters:  03/02/22 271 lb 3.2 oz (123 kg)  01/30/22 263 lb 6.4 oz (119.5 kg)  12/06/21 257 lb (116.6 kg)    Physical Exam Constitutional:      General: She is not in acute distress.    Appearance: She is not diaphoretic.  Cardiovascular:     Rate and Rhythm: Normal rate and regular rhythm.     Heart sounds: Normal heart sounds.  Pulmonary:     Effort: Pulmonary effort is normal.     Breath sounds: Normal breath sounds.  Skin:    General: Skin is warm and dry.  Neurological:     Mental Status: She is alert.      Assessment/Plan: Please see individual problem list.  Skin tag Assessment & Plan: She will contact her GI physician to see if this is something they could take care of or if they know who at [facility] can take care of this.   Obesity (BMI 35.0-39.9 without comorbidity) Assessment & Plan: Chronic issue.  Discussed that we do not have  Wegovy samples.  I encouraged her to contact local pharmacies in the next few weeks to see if they have this back in stock.   Sleeping difficulty Assessment & Plan: Chronic issue.  Discussed not looking at screens the hour before bed.  Will trial trazodone and see if this will help.  Discussed the  risk of excessive drowsiness with this and if she has excessive drowsiness the day after taking this she will discontinue it and let us know.   Other orders -     traZODone HCl; Take 0.5-1 tablets (25-50 mg total) by mouth at bedtime as needed for sleep.  Dispense: 30 tablet; Refill: 3     Return in about 3 months (around 06/01/2022) for Sleep.   [name], MD [place]

## 2022-03-02 NOTE — Assessment & Plan Note (Signed)
She will contact her GI physician to see if this is something they could take care of or if they know who at [facility] can take care of this.

## 2022-03-25 ENCOUNTER — Ambulatory Visit
Admission: RE | Admit: 2022-03-25 | Discharge: 2022-03-25 | Disposition: A | Discharge status: Home / Self Care | Payer: BC Managed Care – PPO | Source: Ambulatory Visit | Attending: Family Medicine | Admitting: Family Medicine

## 2022-03-25 VITALS — BP 130/81 | HR 61 | Temp 98.8°F | Resp 20 | Ht 67.0 in | Wt 251.0 lb

## 2022-03-25 DIAGNOSIS — U071 COVID-19: Secondary | ICD-10-CM

## 2022-03-25 LAB — RESP PANEL BY RT-PCR (RSV, FLU A&B, COVID)  RVPGX2
Influenza A by PCR: NEGATIVE
Influenza B by PCR: NEGATIVE
Resp Syncytial Virus by PCR: NEGATIVE
SARS Coronavirus 2 by RT PCR: POSITIVE — AB

## 2022-03-25 LAB — GROUP A STREP BY PCR: Group A Strep by PCR: NOT DETECTED

## 2022-03-25 MED ORDER — PROMETHAZINE-DM 6.25-15 MG/5ML PO SYRP: 5.0000 mL | ORAL_SOLUTION | Freq: Four times a day (QID) | ORAL | 0 refills | Status: DC | PRN

## 2022-03-25 MED ORDER — MOLNUPIRAVIR EUA 200MG CAPSULE: 4.0000 | ORAL_CAPSULE | Freq: Two times a day (BID) | ORAL | 0 refills | Status: AC

## 2022-03-25 MED ORDER — BENZONATATE 100 MG PO CAPS: 200.0000 mg | ORAL_CAPSULE | Freq: Three times a day (TID) | ORAL | 0 refills | Status: DC

## 2022-03-25 MED ORDER — IPRATROPIUM BROMIDE 0.06 % NA SOLN: 2.0000 | Freq: Four times a day (QID) | NASAL | 12 refills | Status: DC

## 2022-03-25 NOTE — ED Provider Notes (Signed)
[facility]    CSN: [id number] Arrival date & time: 03/25/22  1248      History   Chief Complaint Chief Complaint  Patient presents with   Sore Throat    Cough, Chills started on Thursday evening - Entered by patient    HPI Jill Hawkins is a 47 y.o. female.   HPI  47 year old female here for evaluation of respiratory complaints.  The patient reports that her symptoms began 2 nights ago and worsened yesterday.  They consist of chills, runny nose, ear pain, nonproductive cough, and chest tightness.  She denies any fever, shortness of breath, wheezing, or GI complaints.  She is unaware of any sick contacts but she does work in a Theatre manager.  Past Medical History:  Diagnosis Date   Frequent headaches    GERD (gastroesophageal reflux disease)    History of fainting spells of unknown cause    Hypertension    Kidney stones    Migraine    UTI (lower urinary tract infection)     Patient Active Problem List   Diagnosis Date Noted   Skin tag 03/02/2022   Sleeping difficulty 03/02/2022   Sensation of feeling hot 01/30/2022   Muscle cramps 09/05/2021   Cholelithiasis without obstruction 05/03/2021   Dysuria 05/03/2021   Gastrojejunal ulcer 05/03/2021   Iron deficiency 05/03/2021   Enteritis 05/03/2021   Toe pain 05/03/2021   Migraine 05/03/2021   Carpal tunnel syndrome 05/03/2021   Overactive bladder 02/02/2021   IDA (iron deficiency anemia) 08/23/2020   Palpitations 08/23/2020   Ingrown hair 08/23/2020   Bright red blood per rectum 05/10/2020   Lateral epicondylitis of right elbow 05/10/2020   Bilateral leg edema 05/10/2020   Labial lesion 03/10/2020   Breast pain, right 03/10/2020   COVID-19 02/24/2020   Positive anti-CCP test 07/10/2019   Right hip pain 06/05/2019   Decreased sex drive 05/39/7673   Osteopenia 06/05/2019   Obesity (BMI 35.0-39.9 without comorbidity) 06/05/2019   GERD (gastroesophageal reflux disease) 11/30/2018   Skin  lesion 11/30/2018   Wrist nodule 11/30/2018   Breast cancer screening 11/30/2018   Muscle strain of right scapular region 10/09/2018   Chronic use of opiate drug for therapeutic purpose 12/03/2017   Neck pain 12/03/2017   Hypoglycemic disorder 07/17/2017   Anxiety and depression 05/17/2017   Left leg swelling 11/17/2016   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   Allergic rhinitis 06/20/2016   Right shoulder pain 06/20/2016   Candidal intertrigo 06/20/2016   Hemorrhoids 06/20/2016   Soft tissue mass 06/20/2016   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   Nausea 04/18/2016   Left lower quadrant pain 11/18/2015   Encounter for general adult medical examination with abnormal findings 06/27/2015   Cyst of right ovary 04/14/2015   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   Back pain 06/22/2014   Right cervical radiculopathy 04/25/2014   Left knee pain 04/25/2014   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/25/2014   Pain in joint involving lower leg 04/25/2014   Morbid (severe) obesity due to excess calories ([place]) 11/11/2012    Past Surgical History:  Procedure Laterality Date   CARPAL TUNNEL RELEASE  2005   CHOLECYSTECTOMY  2015   GASTRIC BYPASS  04/14/13   GROWTH PLATE SURGERY  4193   Right hip    OB History     Gravida  2   Para  2   Term  2   Preterm      AB  Living  2      SAB      IAB      Ectopic      Multiple      Live Births  2            Home Medications    Prior to Admission medications   Medication Sig Start Date End Date Taking? Authorizing Provider  benzonatate (TESSALON) 100 MG capsule Take 2 capsules (200 mg total) by mouth every 8 (eight) hours. 03/25/22  Yes Margarette Canada, NP  ipratropium (ATROVENT) 0.06 % nasal spray Place 2 sprays into both nostrils 4 (four) times daily. 03/25/22  Yes Margarette Canada, NP  molnupiravir EUA (LAGEVRIO) 200 mg CAPS capsule Take 4 capsules (800 mg total) by mouth 2 (two) times daily for 5 days. 03/25/22 03/30/22  Yes Margarette Canada, NP  promethazine-dextromethorphan (PROMETHAZINE-DM) 6.25-15 MG/5ML syrup Take 5 mLs by mouth 4 (four) times daily as needed. 03/25/22  Yes Margarette Canada, NP  ALPRAZolam Duanne Moron) 1 MG tablet TAKE 1 TABLET(1 MG) BY MOUTH DAILY AS NEEDED FOR ANXIETY 08/11/21   Leone Haven, MD  B Complex Vitamins (VITAMIN-B COMPLEX) TABS Take by mouth.    [provider]  clobetasol cream (TEMOVATE) 3.53 % Apply 1 Application topically 2 (two) times daily. Prn right lower leg and arms prn x up to 2 weeks 11/09/21   McLean-Scocuzza, Nino Glow, MD  Continuous Blood Gluc Receiver (DEXCOM G6 RECEIVER) DEVI Use 1 receiver to check glucose every 6 hours.  Diagnosis code E16.2 11/11/19   Leone Haven, MD  Continuous Blood Gluc Receiver (FREESTYLE LIBRE 14 DAY READER) DEVI Use to check sugar 4 times daily 11/18/19   Leone Haven, MD  Continuous Blood Gluc Sensor (FREESTYLE LIBRE 14 DAY SENSOR) MISC Apply once every 14 days. 11/18/19   Leone Haven, MD  Continuous Blood Gluc Transmit (DEXCOM G6 TRANSMITTER) MISC Use 1 transmitter to check glucose every 6 hours.  Diagnosis code E16.2 11/11/19   Leone Haven, MD  Crisaborole (EUCRISA) 2 % OINT Apply 1 application topically in the morning and at bedtime. Apply to affected areas of hands 04/22/20   [facility], [place], MD  cyanocobalamin (,VITAMIN B-12,) 1000 MCG/ML injection Inject into the muscle. 05/19/21   [provider]  estradiol (ESTRACE) 0.1 MG/GM vaginal cream Place 0.5 g vaginally 2 (two) times a week. Place 0.5g nightly for two weeks then twice a week after 12/08/21   Jaquita Folds, MD  etanercept (ENBREL SURECLICK) 50 MG/ML injection Inject 50 mLs into the skin once a week. On friday 03/22/20   [provider]  fexofenadine (ALLEGRA) 180 MG tablet TAKE ONE TABLET BY MOUTH DAILY 02/22/22   Leone Haven, MD  folic acid (FOLVITE) 1 MG tablet TAKE 1 TABLET BY MOUTH DAILY 02/22/22   Leone Haven, MD   hydrocortisone (ANUSOL-HC) 2.5 % rectal cream Place 1 application rectally 2 (two) times daily. 07/17/17   Leone Haven, MD  hydrocortisone 2.5 % cream Apply to affected areas twice daily as needed for up to 1 week. Can repeat in a month if needed 06/07/20   [facility] [place], MD  ketoconazole (NIZORAL) 2 % cream Apply to affected area twice daily as needed 06/07/20   [facility], [place], MD  levonorgestrel (LILETTA) 18.6 MCG/DAY IUD IUD by Intrauterine route.    [provider]  mometasone (NASONEX) 50 MCG/ACT nasal spray Place 2 sprays into the nose daily. 01/30/22   Leone Haven, MD  montelukast (SINGULAIR) 10 MG tablet TAKE ONE TABLET BY MOUTH EVERY NIGHT AT BEDTIME 08/13/20   Leone Haven, MD  Multiple Vitamin (MULTI-VITAMINS) TABS Take by mouth.    [provider]  nystatin-triamcinolone ointment (MYCOLOG) Apply 1 application topically 2 (two) times daily. 07/17/17   Leone Haven, MD  ondansetron (ZOFRAN-ODT) 4 MG disintegrating tablet  04/27/16   [provider]  pregabalin (LYRICA) 50 MG capsule TAKE 1 CAPSULE BY MOUTH TWICE A DAY 01/02/22   Leone Haven, MD  promethazine (PHENERGAN) 25 MG tablet Take 1 tablet (25 mg total) by mouth every 8 (eight) hours as needed for nausea or vomiting. 04/29/21   McLean-Scocuzza, Nino Glow, MD  Semaglutide-Weight Management (WEGOVY) 0.25 MG/0.5ML SOAJ Inject 0.5 mL (0.25 mg total) under the skin every 7 days for 28 days. 01/25/22     solifenacin (VESICARE) 5 MG tablet Take 1 tablet (5 mg total) by mouth daily. 05/03/21   Leone Haven, MD  spironolactone (ALDACTONE) 25 MG tablet Take 25 mg by mouth daily. 08/02/20   [provider]  SUMAtriptan (IMITREX) 50 MG tablet TAKE ONE TABLET BY MOUTH AT ONSET OF HEADACHE; MAY REPEAT ONE TABLET IN 2 HOURS IF NEEDED. NO MORE THAN 2 DOSES IN A 24 HOUR PERIOD 10/10/21   Leone Haven, MD  SUMAtriptan (IMITREX) 50 MG tablet Take 1 tablet (50 mg total) by mouth every 2  (two) hours as needed for migraine. May repeat in 2 hours if headache persists or recurs. No more than 2 doses in a 24 hour time span. 10/10/21   Leone Haven, MD  traZODone (DESYREL) 50 MG tablet Take 0.5-1 tablets (25-50 mg total) by mouth at bedtime as needed for sleep. 03/02/22   Leone Haven, MD  venlafaxine XR (EFFEXOR-XR) 150 MG 24 hr capsule TAKE ONE CAPSULE BY MOUTH DAILY WITH BREAKFAST 10/27/21   Dutch Quint B, FNP  buPROPion [facility]) 100 MG tablet TAKE ONE TABLET BY MOUTH THREE TIMES A DAY 10/03/19 02/23/20  Leone Haven, MD    Family History Family History  Problem Relation Age of Onset   Arthritis Mother    Hypertension Mother    Mental illness Mother    Diabetes Mother    Alcohol abuse Father    Cancer Father    Hypertension Father    Mental illness Brother    Heart disease Maternal Grandmother    Hypertension Maternal Grandmother    Mental illness Maternal Grandmother    Diabetes Maternal Grandmother    Cancer Maternal Grandfather    Hypertension Maternal Grandfather    Mental illness Paternal Grandmother     Social History Social History   Tobacco Use   Smoking status: Never   Smokeless tobacco: Never  Vaping Use   Vaping Use: Never used  Substance Use Topics   Alcohol use: No    Alcohol/week: 0.0 standard drinks of alcohol   Drug use: No     Allergies   Penicillins, Oxycodone-acetaminophen, Tramadol, Codeine, and Tape   Review of Systems Review of Systems  Constitutional:  Positive for chills. Negative for fever.  HENT:  Positive for congestion, rhinorrhea and sore throat. Negative for ear pain.   Respiratory:  Positive for cough and chest tightness. Negative for shortness of breath and wheezing.   Cardiovascular:  Negative for chest pain.  Gastrointestinal:  Negative for diarrhea, nausea and vomiting.  Skin:  Negative for rash.  Hematological: Negative.   Psychiatric/Behavioral: Negative.  Physical Exam Triage Vital  Signs ED Triage Vitals  Enc Vitals Group     BP 03/25/22 1306 130/81     Pulse Rate 03/25/22 1306 61     Resp 03/25/22 1306 20     Temp 03/25/22 1306 98.8 F (37.1 C)     Temp Source 03/25/22 1306 Oral     SpO2 03/25/22 1306 100 %     Weight 03/25/22 1310 251 lb (113.9 kg)     Height 03/25/22 1310 '5\' 7"'$  (1.702 m)     Head Circumference --      Peak Flow --      Pain Score 03/25/22 1310 8     Pain Loc --      Pain Edu? --      Excl. in [place]? --    No data found.  Updated Vital Signs BP 130/81 (BP Location: Left Arm)   Pulse 61   Temp 98.8 F (37.1 C) (Oral)   Resp 20   Ht '5\' 7"'$  (1.702 m)   Wt 251 lb (113.9 kg)   SpO2 100%   BMI 39.31 kg/m   Visual Acuity Right Eye Distance:   Left Eye Distance:   Bilateral Distance:    Right Eye Near:   Left Eye Near:    Bilateral Near:     Physical Exam Vitals and nursing note reviewed.  Constitutional:      Appearance: Normal appearance. She is ill-appearing.  HENT:     Head: Normocephalic and atraumatic.     Right Ear: Tympanic membrane, ear canal and external ear normal. There is no impacted cerumen.     Left Ear: Tympanic membrane, ear canal and external ear normal. There is no impacted cerumen.     Nose: Congestion and rhinorrhea present.     Comments: Nasal mucosa is erythematous edematous with clear discharge in both nares.    Mouth/Throat:     Mouth: Mucous membranes are moist.     Pharynx: Oropharynx is clear. Posterior oropharyngeal erythema present. No oropharyngeal exudate.     Comments: Patient is erythema injection the posterior oropharynx.  Bilateral tonsillar pillars are also erythematous and injected but no exudate. Cardiovascular:     Rate and Rhythm: Normal rate and regular rhythm.     Pulses: Normal pulses.     Heart sounds: Normal heart sounds. No murmur heard.    No friction rub. No gallop.  Pulmonary:     Effort: Pulmonary effort is normal.     Breath sounds: Normal breath sounds. No wheezing,  rhonchi or rales.  Musculoskeletal:     Cervical back: Normal range of motion and neck supple.  Lymphadenopathy:     Cervical: No cervical adenopathy.  Skin:    General: Skin is warm and dry.     Capillary Refill: Capillary refill takes less than 2 seconds.  Neurological:     General: No focal deficit present.     Mental Status: She is alert and oriented to person, place, and time.  Psychiatric:        Mood and Affect: Mood normal.        Behavior: Behavior normal.        Thought Content: Thought content normal.        Judgment: Judgment normal.      UC Treatments / Results  Labs (all labs ordered are listed, but only abnormal results are displayed) Labs Reviewed  RESP PANEL BY RT-PCR (RSV, FLU A&B, COVID)  [id number] - Abnormal; Notable  for the following components:      Result Value   SARS Coronavirus 2 by RT PCR POSITIVE (*)    All other components within normal limits  GROUP A STREP BY PCR    EKG   Radiology No results found.  Procedures Procedures (including critical care time)  Medications Ordered in UC Medications - No data to display  Initial Impression / Assessment and Plan / UC Course  I have reviewed the triage vital signs and the nursing notes.  Pertinent labs & imaging results that were available during my care of the patient were reviewed by me and considered in my medical decision making (see chart for details).   Patient is a pleasant, though ill-appearing, 47 year old female here for evaluation of 2-day history of respiratory symptoms as outlined in HPI above.  Her physical exam reveals erythema and edema over nasal mucosa with clear rhinorrhea.  Her posterior oropharynx and tonsillar pillars are erythematous and injected but no exudate is appreciated.  There is clear postnasal drip present on exam.  No cervical lymphadenopathy appreciated.  Cardiopulmonary exam reveals clear lung sounds in all fields.  Deep respirations does trigger a cough.  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  I will order a respiratory panel look for the presence of COVID or influenza.  I will also order a strep PCR given her sore throat and injection of her tonsillar pillars.  Strep PCR is negative.  Respiratory panel is positive for COVID-negative for influenza and RSV.  I will discharge patient home with diagnosis of COVID-19.  She has significant, but he is to include IVC anemia, migraines, obesity, hypoglycemia, and hypertension.  She is status post gastric bypass.  I will discharge her home on molnupiravir as she has multiple medication allergies to Paxlovid.  I will also prescribe Atrovent nasal spray, Tessalon Perles, and Promethazine DM cough syrup.   Final Clinical Impressions(s) / UC Diagnoses   Final diagnoses:  [id number]     Discharge Instructions      You have tested positive today for COVID-19.  You will need to quarantine for 5 days from onset of your symptoms.  After 5 days you can break quarantine if your symptoms have improved and you have not run a fever for 24 hours without taking Tylenol and/or ibuprofen.  You will need a mask around other people for additional 5 days however.  Take over-the-counter Tylenol and/or ibuprofen according to package instructions as needed for fever and bodyaches.  Take the molnupiravir twice daily for 5 days for treatment of COVID-19.  Use the Atrovent nasal spray, 2 squirts in each nostril every 6 hours, as needed for runny nose and postnasal drip.  Use the Tessalon Perles every 8 hours during the day.  Take them with a small sip of water.  They may give you some numbness to the base of your tongue or a metallic taste in your mouth, this is normal.  Use the Promethazine DM cough syrup at bedtime for cough and congestion.  It  will make you drowsy so do not take it during the day.  If you develop any shortness of breath, especially at rest, feel as though you cannot catch your breath, you are unable to speak in full sentences, or your lips begin turning blue  you need to call 911 and go to the ER.     ED Prescriptions     Medication Sig Dispense Auth. Provider   benzonatate (TESSALON) 100 MG capsule Take 2 capsules (200 mg total) by mouth every 8 (eight) hours. 21 capsule Margarette Canada, NP   ipratropium (ATROVENT) 0.06 % nasal spray Place 2 sprays into both nostrils 4 (four) times daily. 15 mL Margarette Canada, NP   promethazine-dextromethorphan (PROMETHAZINE-DM) 6.25-15 MG/5ML syrup Take 5 mLs by mouth 4 (four) times daily as needed. 118 mL Margarette Canada, NP   molnupiravir EUA (LAGEVRIO) 200 mg CAPS capsule Take 4 capsules (800 mg total) by mouth 2 (two) times daily for 5 days. 40 capsule Margarette Canada, NP      PDMP not reviewed this encounter.   Margarette Canada, NP 03/25/22 1406

## 2022-03-25 NOTE — ED Triage Notes (Signed)
Patient present with sore throat, chills, cough, tightness in chest x day 2.

## 2022-03-25 NOTE — Discharge Instructions (Addendum)
You have tested positive today for COVID-19.  You will need to quarantine for 5 days from onset of your symptoms.  After 5 days you can break quarantine if your symptoms have improved and you have not run a fever for 24 hours without taking Tylenol and/or ibuprofen.  You will need a mask around other people for additional 5 days however.  Take over-the-counter Tylenol and/or ibuprofen according to package instructions as needed for fever and bodyaches.  Take the molnupiravir twice daily for 5 days for treatment of COVID-19.  Use the Atrovent nasal spray, 2 squirts in each nostril every 6 hours, as needed for runny nose and postnasal drip.  Use the Tessalon Perles every 8 hours during the day.  Take them with a small sip of water.  They may give you some numbness to the base of your tongue or a metallic taste in your mouth, this is normal.  Use the Promethazine DM cough syrup at bedtime for cough and congestion.  It will make you drowsy so do not take it during the day.  If you develop any shortness of breath, especially at rest, feel as though you cannot catch your breath, you are unable to speak in full sentences, or your lips begin turning blue you need to call 911 and go to the ER.

## 2022-03-27 ENCOUNTER — Encounter: Payer: Self-pay | Admitting: Family Medicine

## 2022-04-04 ENCOUNTER — Other Ambulatory Visit: Payer: Self-pay

## 2022-04-17 ENCOUNTER — Encounter: Payer: Self-pay | Admitting: Pharmacist

## 2022-04-17 ENCOUNTER — Other Ambulatory Visit: Payer: Self-pay

## 2022-04-17 ENCOUNTER — Encounter: Payer: Self-pay | Admitting: Obstetrics and Gynecology

## 2022-04-19 ENCOUNTER — Other Ambulatory Visit: Payer: Self-pay | Admitting: Family Medicine

## 2022-04-20 ENCOUNTER — Other Ambulatory Visit: Payer: Self-pay

## 2022-04-20 ENCOUNTER — Encounter: Payer: Self-pay | Admitting: Family Medicine

## 2022-04-20 ENCOUNTER — Other Ambulatory Visit: Payer: Self-pay | Admitting: Family Medicine

## 2022-04-20 DIAGNOSIS — G8929 Other chronic pain: Secondary | ICD-10-CM

## 2022-04-20 NOTE — Telephone Encounter (Signed)
Pt called in asking why her refill its been denied by provider?? She's available @ [phone].

## 2022-04-21 ENCOUNTER — Other Ambulatory Visit: Payer: Self-pay

## 2022-04-21 DIAGNOSIS — G8929 Other chronic pain: Secondary | ICD-10-CM

## 2022-04-21 MED ORDER — SUMATRIPTAN SUCCINATE 50 MG PO TABS: 50.0000 mg | ORAL_TABLET | ORAL | 0 refills | Status: DC | PRN

## 2022-04-21 NOTE — Telephone Encounter (Signed)
Patient called about MyChart message sent yesterday. Please call patient.

## 2022-04-24 ENCOUNTER — Other Ambulatory Visit: Payer: Self-pay | Admitting: *Deleted

## 2022-04-27 ENCOUNTER — Other Ambulatory Visit: Payer: Self-pay

## 2022-04-27 DIAGNOSIS — F419 Anxiety disorder, unspecified: Secondary | ICD-10-CM

## 2022-04-27 NOTE — Progress Notes (Unsigned)
Order pended

## 2022-05-11 ENCOUNTER — Other Ambulatory Visit: Payer: Self-pay

## 2022-05-11 DIAGNOSIS — F32A Depression, unspecified: Secondary | ICD-10-CM

## 2022-05-11 MED ORDER — VENLAFAXINE HCL ER 150 MG PO CP24: ORAL_CAPSULE | ORAL | 3 refills | Status: DC

## 2022-05-19 ENCOUNTER — Other Ambulatory Visit: Payer: Self-pay | Admitting: Family Medicine

## 2022-05-31 ENCOUNTER — Ambulatory Visit (INDEPENDENT_AMBULATORY_CARE_PROVIDER_SITE_OTHER): Payer: BC Managed Care – PPO | Admitting: Family Medicine

## 2022-05-31 ENCOUNTER — Encounter: Payer: Self-pay | Admitting: Family Medicine

## 2022-05-31 VITALS — BP 112/64 | HR 75 | Temp 98.0°F | Ht 67.0 in | Wt 260.0 lb

## 2022-05-31 DIAGNOSIS — Z0001 Encounter for general adult medical examination with abnormal findings: Secondary | ICD-10-CM | POA: Diagnosis not present

## 2022-05-31 DIAGNOSIS — R002 Palpitations: Secondary | ICD-10-CM | POA: Diagnosis not present

## 2022-05-31 DIAGNOSIS — K602 Anal fissure, unspecified: Secondary | ICD-10-CM | POA: Diagnosis not present

## 2022-05-31 DIAGNOSIS — E669 Obesity, unspecified: Secondary | ICD-10-CM

## 2022-06-01 ENCOUNTER — Encounter: Payer: Self-pay | Admitting: Family Medicine

## 2022-06-01 ENCOUNTER — Telehealth: Payer: Self-pay | Admitting: Surgery

## 2022-06-01 DIAGNOSIS — K602 Anal fissure, unspecified: Secondary | ICD-10-CM | POA: Insufficient documentation

## 2022-06-01 NOTE — Assessment & Plan Note (Signed)
Physical exam completed.  Encouraged healthy diet and exercise.  She will have her mammogram as scheduled.  She declines further COVID vaccinations.  Lab work as outlined.

## 2022-06-01 NOTE — Telephone Encounter (Signed)
Hey Dr. Christian Mate I just spoke with the patient she is being referred for Rectal fissure from Dr. Caryl Bis and is having some pain, pain level is at a 9. Patient is asking if you would be able to see her today. Please advise.

## 2022-06-01 NOTE — Progress Notes (Signed)
[name], MD Phone: [phone]  Jill Hawkins is a 47 y.o. female who presents today for CPE.  Diet: Generally healthy, no sweets, no soda Exercise: Does not have enough time to exercise much Pap smear: 11/11/2019 NILM negative HPV Colonoscopy: Reports she had done last year through [facility], 04/18/2021 with 5-year recall Mammogram: Scheduled for the near future Family history-  Colon cancer: no  Breast cancer: no  Ovarian cancer: no Menses: has IUD Vaccines-   Flu: out of season  Tetanus: UTD  COVID19: x2, made ill so defers further HIV screening: UTD Hep C Screening: UTD Tobacco use: no Alcohol use: occasional Illicit Drug use: no Dentist: yes Ophthalmology: yes  Anal fissure: Patient notes she saw GI about a month or so ago and they placed her on nifedipine/lidocaine cream.  She describes pain with bowel movements and feels as though she is passing glass.  She has been using this without much benefit.  She is also using Preparation H.  She is getting constipated and had some abdominal discomfort with some nausea several days ago.  The abdominal pain and nausea have improved some though she still feels constipated.  She started using Colace yesterday.  Palpitations: This has been intermittent ongoing issue.  She saw cardiology several years ago and per her report had an echo and stress test as well as Holter monitor.  She notes she did not follow-up after that.  She notes at times it occurs when she is just sitting.  She would like to see a different cardiologist.   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   Allergic rhinitis 06/20/2016   Right shoulder pain 06/20/2016   Candidal intertrigo  06/20/2016   Hemorrhoids 06/20/2016   Soft tissue mass 06/20/2016   Left leg swelling 11/17/2016   Anxiety and depression 05/17/2017   Hypoglycemic disorder 07/17/2017   GERD (gastroesophageal reflux disease) 11/30/2018   Skin lesion 11/30/2018   Wrist nodule 11/30/2018   Breast cancer screening 11/30/2018   Right hip pain 06/05/2019   Decreased sex drive [id number]   Osteopenia 06/05/2019   Obesity (BMI 35.0-39.9 without comorbidity) 06/05/2019   COVID-19 02/24/2020   Labial lesion 03/10/2020   Breast pain, right 03/10/2020   Bright red blood per rectum 05/10/2020   Lateral epicondylitis of right elbow 05/10/2020   Bilateral leg edema 05/10/2020   IDA (iron deficiency anemia) 08/23/2020   Palpitations 08/23/2020   Ingrown hair 08/23/2020   Overactive bladder 02/02/2021   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/25/2014   Cholelithiasis without obstruction 05/03/2021   Chronic use of opiate drug for therapeutic purpose 12/03/2017   Nausea 04/18/2016   Cyst of right ovary 04/14/2015   Dysuria 05/03/2021   Gastrojejunal ulcer 05/03/2021   Iron deficiency 05/03/2021   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   Morbid (severe) obesity due to excess calories 11/11/2012   Muscle strain of right scapular region 10/09/2018   Neck pain 12/03/2017   Pain in joint involving lower leg 04/25/2014   Positive anti-CCP test 07/10/2019   Enteritis 05/03/2021   Toe pain 05/03/2021   Migraine 05/03/2021   Carpal tunnel syndrome 05/03/2021   Muscle cramps 09/05/2021   Sensation of feeling hot 01/30/2022  Skin tag 03/02/2022   Sleeping difficulty 03/02/2022   Rectal fissure 06/01/2022   Resolved Ambulatory Problems    Diagnosis Date Noted   Sinusitis, acute maxillary 03/24/2014   Encounter for routine gynecological examination 04/25/2014   Urinary frequency 06/22/2014   Preop examination 12/20/2015   Viral upper respiratory infection 12/20/2015    Respiratory infection 03/20/2016   Bradycardia 06/20/2016   Depression 06/20/2016   Sinusitis 11/17/2016   Urinary urgency 03/10/2020   Right upper quadrant pain 05/03/2021   Past Medical History:  Diagnosis Date   Frequent headaches    History of fainting spells of unknown cause    Hypertension    Kidney stones    UTI (lower urinary tract infection)     Family History  Problem Relation Age of Onset   Arthritis Mother    Hypertension Mother    Mental illness Mother    Diabetes Mother    Alcohol abuse Father    Cancer Father    Hypertension Father    Mental illness Brother    Heart disease Maternal Grandmother    Hypertension Maternal Grandmother    Mental illness Maternal Grandmother    Diabetes Maternal Grandmother    Cancer Maternal Grandfather    Hypertension Maternal Grandfather    Mental illness Paternal Grandmother     Social History   Socioeconomic History   Marital status: Married    Spouse name: Not on file   Number of children: Not on file   Years of education: Not on file   Highest education level: Not on file  Occupational History   Not on file  Tobacco Use   Smoking status: Never   Smokeless tobacco: Never  Vaping Use   Vaping Use: Never used  Substance and Sexual Activity   Alcohol use: No    Alcohol/week: 0.0 standard drinks of alcohol   Drug use: No   Sexual activity: Yes    Partners: Male    Birth control/protection: I.U.D.  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  General:  Negative for nexplained weight loss, fever Skin: Negative for new or changing mole, sore that won't heal HEENT: Negative for trouble hearing, trouble seeing, ringing in ears, mouth sores, hoarseness, change in voice, dysphagia. CV: Positive for palpitations, negative for chest pain, dyspnea, edema Resp: Negative for cough, dyspnea, hemoptysis GI: Positive for nausea, constipation, abdominal pain, negative for vomiting, diarrhea, melena, hematochezia. GU: Positive for frequent urination, sexual difficulty, negative for dysuria, incontinence, urinary hesitance, hematuria, vaginal or penile discharge, lumps in testicle or breasts MSK: Negative for muscle cramps or aches, joint pain or swelling Neuro: Positive for headaches, weakness, negative for numbness, dizziness, passing out/fainting Psych: Positive for anxiety, negative for depression, memory problems  Objective  Physical Exam Vitals:   05/31/22 1612  BP: 112/64  Pulse: 75  Temp: 98 F (36.7  C)  SpO2: 97%    BP Readings from Last 3 Encounters:  05/31/22 112/64  03/25/22 130/81  03/02/22 118/70   Wt Readings from Last 3 Encounters:  05/31/22 260 lb (117.9 kg)  03/25/22 251 lb (113.9 kg)  03/02/22 271 lb 3.2 oz (123 kg)    Physical Exam Constitutional:      General: She is not in acute distress.    Appearance: She is not diaphoretic.  HENT:     Head: Normocephalic and atraumatic.  Cardiovascular:     Rate and Rhythm: Normal rate and regular rhythm.     Heart sounds: Normal heart sounds.  Pulmonary:     Effort: Pulmonary effort is normal.     Breath sounds: Normal breath sounds.  Abdominal:     General: Bowel sounds are normal. There is no distension.     Palpations: Abdomen is soft.     Tenderness: There is no abdominal tenderness.  Musculoskeletal:     Right lower leg: No edema.     Left lower leg: No edema.  Lymphadenopathy:      Cervical: No cervical adenopathy.  Skin:    General: Skin is warm and dry.  Neurological:     Mental Status: She is alert.      Assessment/Plan:   Encounter for general adult medical examination with abnormal findings Assessment & Plan: Physical exam completed.  Encouraged healthy diet and exercise.  She will have her mammogram as scheduled.  She declines further COVID vaccinations.  Lab work as outlined.   Obesity (BMI 35.0-39.9 without comorbidity) -     Comprehensive metabolic panel; Future -     Lipid panel; Future -     Hemoglobin A1c; Future  Palpitations Assessment & Plan: Chronic intermittent issue.  We will refer to a different cardiologist to get their input.  Orders: -     Ambulatory referral to Cardiology  Rectal fissure Assessment & Plan: History is consistent with a rectal fissure.  She has been doing appropriate management through GI.  Given lack of improvement I will refer her to a general surgeon to get their input on whether or not she needs a procedure for this.  Orders: -     Ambulatory referral to General Surgery    Return in about 2 weeks (around 06/14/2022) for labs, 4 months PCP for weight.   [name], MD [place]

## 2022-06-01 NOTE — Assessment & Plan Note (Signed)
Chronic intermittent issue.  We will refer to a different cardiologist to get their input.

## 2022-06-01 NOTE — Assessment & Plan Note (Signed)
History is consistent with a rectal fissure.  She has been doing appropriate management through GI.  Given lack of improvement I will refer her to a general surgeon to get their input on whether or not she needs a procedure for this.

## 2022-06-02 ENCOUNTER — Encounter: Payer: Self-pay | Admitting: Surgery

## 2022-06-02 ENCOUNTER — Ambulatory Visit (INDEPENDENT_AMBULATORY_CARE_PROVIDER_SITE_OTHER): Payer: BC Managed Care – PPO | Admitting: Surgery

## 2022-06-02 VITALS — BP 132/81 | HR 67 | Temp 98.2°F | Ht 67.0 in | Wt 258.2 lb

## 2022-06-02 DIAGNOSIS — K602 Anal fissure, unspecified: Secondary | ICD-10-CM

## 2022-06-02 MED ORDER — ONABOTULINUMTOXINA 100 UNITS IJ SOLR
50.0000 [IU] | INTRAMUSCULAR | Status: DC
Start: 2022-06-07 — End: 2022-06-07

## 2022-06-02 MED ORDER — HYDROCORTISONE (PERIANAL) 2.5 % EX CREA: 1.0000 | TOPICAL_CREAM | Freq: Two times a day (BID) | CUTANEOUS | 0 refills | Status: DC

## 2022-06-02 NOTE — H&P (View-Only) (Signed)
06/02/2022  Reason for Visit: Anal fissure  Requesting Provider: Marikay Alar, MD  History of Present Illness: Jill Hawkins is a 47 y.o. female presenting for evaluation of an anal fissure.  The patient reports that she has noticed this since about late February.  She saw gastroenterology at [facility] and was started on compounded nifedipine ointment.  She has used this since then twice daily as instructed but has not noticed any improvement.  In fact, she has noticed worsening over the last 2 weeks given that she had a couple episodes of diarrhea due to gastroenteritis which created more burning and pain sensation and subsequently developed constipation which made things even worse this past week.  She describes the pain as if she were passing glass through her anal canal.  She does notice blood sometimes particularly when wiping and very seldom notices this in the underwear itself.  It is painful to sit and she is hoping to have this addressed and treated.  Denies any abdominal pain but reports feeling diaphoretic or with sweats at times due to the pain with the bowel movements.  She has also been trying Preparation H wipes and sitz bath's in between the doses of the nifedipine.  She has tried stool softeners as well to help make the stool softer but she still having pain issues.  She had a colonoscopy in February 2023 which shows internal hemorrhoids and was also told that she had anal skin tags but otherwise no other issues in the perianal area.  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 Authorizing Provider  ALPRAZolam Prudy Feeler) 1 MG tablet TAKE 1 TABLET(1 MG) BY MOUTH DAILY AS NEEDED FOR ANXIETY 08/11/21  Yes Glori Luis, MD  B Complex Vitamins (VITAMIN-B COMPLEX) TABS Take by mouth.   Yes [provider]  clobetasol cream (TEMOVATE) 0.05 % Apply 1 Application topically 2 (two) times daily. Prn right lower leg and arms prn x up to 2 weeks 11/09/21  Yes McLean-Scocuzza, Pasty Spillers, MD  Continuous Blood Gluc Receiver (FREESTYLE LIBRE 14 DAY READER) DEVI Use to check sugar 4 times daily 11/18/19  Yes Glori Luis, MD  Continuous Blood Gluc Sensor (FREESTYLE LIBRE 14 DAY SENSOR) MISC Apply once every 14 days. 11/18/19  Yes Glori Luis, MD  Crisaborole (EUCRISA) 2 % OINT Apply 1 application topically in the morning and at bedtime. Apply to affected areas of hands 04/22/20  Yes Moye, [place], MD  cyanocobalamin (,VITAMIN B-12,) 1000 MCG/ML injection Inject into the muscle. 05/19/21  Yes [provider]  estradiol (ESTRACE) 0.1 MG/GM vaginal cream Place 0.5 g vaginally 2 (two) times a week. Place 0.5g nightly for two weeks then twice a week after 12/08/21  Yes Marguerita Beards, MD  etanercept (ENBREL SURECLICK) 50 MG/ML injection Inject 50 mLs into the skin once a week. On friday 03/22/20  Yes [provider]  fexofenadine (ALLEGRA) 180 MG tablet TAKE ONE TABLET BY MOUTH DAILY 02/22/22  Yes Glori LuisSonnenberg, Eric G, MD  folic acid (FOLVITE) 1 MG tablet TAKE 1 TABLET BY MOUTH DAILY 05/19/22  Yes Glori LuisSonnenberg, Eric G, MD  hydrocortisone (ANUSOL-HC) 2.5 % rectal cream Place 1 Application rectally 2 (two) times daily. 06/02/22  Yes Mickael Mcnutt, Elita QuickJose, MD  hydrocortisone 2.5 % cream Apply to affected areas twice daily as needed for up to 1 week. Can repeat in a month if needed 06/07/20  Yes Moye, [place], MD  ipratropium (ATROVENT) 0.06 % nasal spray Place 2  sprays into both nostrils 4 (four) times daily. 03/25/22  Yes Becky Augustayan, Jeremy, NP  ketoconazole (NIZORAL) 2 % cream Apply to affected area twice daily as needed 06/07/20  Yes Moye, [place], MD  levonorgestrel (LILETTA) 18.6 MCG/DAY IUD IUD by Intrauterine route.   Yes [provider]  mometasone (NASONEX) 50 MCG/ACT nasal spray Place 2 sprays into the nose daily. 01/30/22  Yes Glori LuisSonnenberg, Eric G, MD  montelukast (SINGULAIR) 10 MG tablet TAKE ONE TABLET BY MOUTH EVERY NIGHT AT BEDTIME 08/13/20  Yes Glori LuisSonnenberg, Eric G, MD  Multiple Vitamin (MULTI-VITAMINS) TABS Take by mouth.   Yes [provider]  nystatin-triamcinolone ointment (MYCOLOG) Apply 1 application topically 2 (two) times daily. 07/17/17  Yes Glori LuisSonnenberg, Eric G, MD  ondansetron (ZOFRAN-ODT) 4 MG disintegrating tablet  04/27/16  Yes [provider]  pregabalin (LYRICA) 50 MG capsule TAKE 1 CAPSULE BY MOUTH TWICE A DAY 01/02/22  Yes Glori LuisSonnenberg, Eric G, MD  promethazine (PHENERGAN) 25 MG tablet Take 1 tablet (25 mg total) by mouth every 8 (eight) hours as needed for nausea or vomiting. 04/29/21  Yes McLean-Scocuzza, Pasty Spillersracy N, MD  solifenacin (VESICARE) 5 MG tablet Take 1 tablet (5 mg total) by mouth daily. 05/03/21  Yes Glori LuisSonnenberg, Eric G, MD  spironolactone (ALDACTONE) 25 MG tablet Take 25 mg by mouth daily. 08/02/20  Yes [provider]  SUMAtriptan (IMITREX) 50 MG tablet Take 1 tablet (50 mg total) by mouth every 2 (two) hours as needed for migraine. May repeat in 2 hours if headache persists or recurs. No more than 2 doses in a 24 hour time span. 04/21/22  Yes Glori LuisSonnenberg, Eric G, MD  traZODone (DESYREL) 50 MG tablet Take 0.5-1 tablets (25-50 mg total) by mouth at bedtime as needed for sleep. 03/02/22  Yes Glori LuisSonnenberg, Eric G, MD  venlafaxine XR (EFFEXOR-XR) 150 MG 24 hr capsule TAKE ONE CAPSULE BY MOUTH DAILY WITH BREAKFAST 05/11/22  Yes Glori LuisSonnenberg, Eric G, MD  buPROPion [facility](WELLBUTRIN) 100 MG tablet TAKE ONE TABLET BY MOUTH THREE  TIMES A DAY 10/03/19 02/23/20  Glori LuisSonnenberg, Eric G, MD    Allergies: Allergies  Allergen Reactions   Penicillins Rash and Swelling   Oxycodone-Acetaminophen Rash   Tramadol Anxiety and Itching   Codeine Other (See Comments)    Chest pains   Tape Hives    Social History:  reports that she has never smoked. She has never used smokeless tobacco. She reports that she does not drink alcohol and does not use drugs.   Family History: Family History  Problem Relation Age of Onset   Arthritis Mother    Hypertension Mother    Mental illness Mother    Diabetes Mother    Alcohol abuse Father    Cancer Father    Hypertension Father    Mental illness Brother    Heart disease Maternal Grandmother    Hypertension Maternal Grandmother    Mental illness  Maternal Grandmother    Diabetes Maternal Grandmother    Cancer Maternal Grandfather    Hypertension Maternal Grandfather    Mental illness Paternal Grandmother     Review of Systems: Review of Systems  Constitutional:  Positive for diaphoresis (due to pain). Negative for chills and fever.  HENT:  Negative for hearing loss.   Respiratory:  Negative for shortness of breath.   Cardiovascular:  Negative for chest pain.  Gastrointestinal:  Positive for constipation and diarrhea. Negative for nausea and vomiting.  Genitourinary:  Negative for dysuria.  Musculoskeletal:  Negative for myalgias.  Skin:  Positive for itching (in perianal area). Negative for rash.  Neurological:  Negative for dizziness.  Psychiatric/Behavioral:  Negative for depression.     Physical Exam BP 132/81   Pulse 67   Temp 98.2 F (36.8 C) (Oral)   Ht  (1.702 m)   Wt 258 lb 3.2 oz (117.1 kg)   SpO2 96%   BMI 40.44 kg/m  CONSTITUTIONAL: No acute distress HEENT:  Normocephalic, atraumatic, extraocular motion intact. NECK: Trachea is midline, and there is no jugular venous distension.  RESPIRATORY:  Normal respiratory effort without pathologic use of  accessory muscles. CARDIOVASCULAR: Regular rhythm and rate. RECTAL: External exam reveals some enlarged external hemorrhoids/skin tags in the left lateral and right anterior columns.  However these are not inflamed or thrombosed and are soft to palpation.  She is tender to palpation in these 2 areas but I do not see any inflammatory changes from hemorrhoids.  Due to her pain, I am unable to examine her well to the tell exactly where her anal fissure is located but she is tender while trying to do a rectal exam and her sphincter tone is tight as well. MUSCULOSKELETAL:  Normal muscle strength and tone in all four extremities.  No peripheral edema or cyanosis. SKIN: Skin turgor is normal. There are no pathologic skin lesions.  NEUROLOGIC:  Motor and sensation is grossly normal.  Cranial nerves are grossly intact. PSYCH:  Alert and oriented to person, place and time. Affect is normal.  Laboratory Analysis: Labs from 04/21/2022: Sodium 143, potassium 3.9, chloride 108, CO2 28.5, BUN 13, creatinine 0.49.  LFTs within normal limits.  WBC 4.4, hemoglobin 12.1, hematocrit 36.9, platelets 154.  Imaging: No results found.  Assessment and Plan: This is a 47 y.o. female with an anal fissure.  - Discussed with her that on exam, I am not able to fully elucidate an anal fissure but do see some enlarged external hemorrhoids/skin tags but there is no inflammatory changes in either 1 of those.  She does have tenderness to palpation in those areas and I am assuming that the fissure is in that vicinity.  Was unable to do rectal exam due to significant pain. - Discussed with her how anal fissures may form and the cycle of tearing with inflammation and spasm of the internal sphincter muscle with resultant decrease in blood flow to the anal mucosa to heal the tear.  Typically, nifedipine compound is the first step in treatment as this can help relax the muscle.  Discussed with patient that since nifedipine has not been  working for this, the next step in the treatment plan for an anal fissure would be doing a chemical sphincterotomy using Botox.  She is in agreement.   -Discussed with her the plan then for an exam under anesthesia with chemical sphincterotomy.  This will allow [place] to evaluate the anal canal better under anesthesia to evaluate  specifically for anal fissures or other potential etiologies for her pain.  If there is a fissure, then we will inject Botox into the internal sphincter.  Reviewed the surgery at length with her including the risks of bleeding, infection, injury to surrounding structures, that this would be an outpatient procedure, postoperative pain control, time off from work, and she is willing to proceed. - We will schedule her for surgery on 06/07/2022.  All of her questions have been answered.  I spent 40 minutes dedicated to the care of this patient on the date of this encounter to include pre-visit review of records, face-to-face time with the patient discussing diagnosis and management, and any post-visit coordination of care.   Howie Ill, MD [facility]

## 2022-06-02 NOTE — Progress Notes (Signed)
06/02/2022  Reason for Visit: Anal fissure  Requesting Provider: Marikay Alar, MD  History of Present Illness: Jill Hawkins is a 47 y.o. female presenting for evaluation of an anal fissure.  The patient reports that she has noticed this since about late February.  She saw gastroenterology at [facility] and was started on compounded nifedipine ointment.  She has used this since then twice daily as instructed but has not noticed any improvement.  In fact, she has noticed worsening over the last 2 weeks given that she had a couple episodes of diarrhea due to gastroenteritis which created more burning and pain sensation and subsequently developed constipation which made things even worse this past week.  She describes the pain as if she were passing glass through her anal canal.  She does notice blood sometimes particularly when wiping and very seldom notices this in the underwear itself.  It is painful to sit and she is hoping to have this addressed and treated.  Denies any abdominal pain but reports feeling diaphoretic or with sweats at times due to the pain with the bowel movements.  She has also been trying Preparation H wipes and sitz bath's in between the doses of the nifedipine.  She has tried stool softeners as well to help make the stool softer but she still having pain issues.  She had a colonoscopy in February 2023 which shows internal hemorrhoids and was also told that she had anal skin tags but otherwise no other issues in the perianal area.  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 Authorizing Provider  ALPRAZolam Prudy Feeler) 1 MG tablet TAKE 1 TABLET(1 MG) BY MOUTH DAILY AS NEEDED FOR ANXIETY 08/11/21  Yes Glori Luis, MD  B Complex Vitamins (VITAMIN-B COMPLEX) TABS Take by mouth.   Yes [provider]  clobetasol cream (TEMOVATE) 0.05 % Apply 1 Application topically 2 (two) times daily. Prn right lower leg and arms prn x up to 2 weeks 11/09/21  Yes McLean-Scocuzza, Pasty Spillers, MD  Continuous Blood Gluc Receiver (FREESTYLE LIBRE 14 DAY READER) DEVI Use to check sugar 4 times daily 11/18/19  Yes Glori Luis, MD  Continuous Blood Gluc Sensor (FREESTYLE LIBRE 14 DAY SENSOR) MISC Apply once every 14 days. 11/18/19  Yes Glori Luis, MD  Crisaborole (EUCRISA) 2 % OINT Apply 1 application topically in the morning and at bedtime. Apply to affected areas of hands 04/22/20  Yes Moye, [place], MD  cyanocobalamin (,VITAMIN B-12,) 1000 MCG/ML injection Inject into the muscle. 05/19/21  Yes [provider]  estradiol (ESTRACE) 0.1 MG/GM vaginal cream Place 0.5 g vaginally 2 (two) times a week. Place 0.5g nightly for two weeks then twice a week after 12/08/21  Yes Marguerita Beards, MD  etanercept (ENBREL SURECLICK) 50 MG/ML injection Inject 50 mLs into the skin once a week. On friday 03/22/20  Yes [provider]  fexofenadine (ALLEGRA) 180 MG tablet TAKE ONE TABLET BY MOUTH DAILY 02/22/22  Yes Glori LuisSonnenberg, Eric G, MD  folic acid (FOLVITE) 1 MG tablet TAKE 1 TABLET BY MOUTH DAILY 05/19/22  Yes Glori LuisSonnenberg, Eric G, MD  hydrocortisone (ANUSOL-HC) 2.5 % rectal cream Place 1 Application rectally 2 (two) times daily. 06/02/22  Yes Kare Dado, Elita QuickJose, MD  hydrocortisone 2.5 % cream Apply to affected areas twice daily as needed for up to 1 week. Can repeat in a month if needed 06/07/20  Yes Moye, [place], MD  ipratropium (ATROVENT) 0.06 % nasal spray Place 2  sprays into both nostrils 4 (four) times daily. 03/25/22  Yes Becky Augustayan, Jeremy, NP  ketoconazole (NIZORAL) 2 % cream Apply to affected area twice daily as needed 06/07/20  Yes Moye, [place], MD  levonorgestrel (LILETTA) 18.6 MCG/DAY IUD IUD by Intrauterine route.   Yes [provider]  mometasone (NASONEX) 50 MCG/ACT nasal spray Place 2 sprays into the nose daily. 01/30/22  Yes Glori LuisSonnenberg, Eric G, MD  montelukast (SINGULAIR) 10 MG tablet TAKE ONE TABLET BY MOUTH EVERY NIGHT AT BEDTIME 08/13/20  Yes Glori LuisSonnenberg, Eric G, MD  Multiple Vitamin (MULTI-VITAMINS) TABS Take by mouth.   Yes [provider]  nystatin-triamcinolone ointment (MYCOLOG) Apply 1 application topically 2 (two) times daily. 07/17/17  Yes Glori LuisSonnenberg, Eric G, MD  ondansetron (ZOFRAN-ODT) 4 MG disintegrating tablet  04/27/16  Yes [provider]  pregabalin (LYRICA) 50 MG capsule TAKE 1 CAPSULE BY MOUTH TWICE A DAY 01/02/22  Yes Glori LuisSonnenberg, Eric G, MD  promethazine (PHENERGAN) 25 MG tablet Take 1 tablet (25 mg total) by mouth every 8 (eight) hours as needed for nausea or vomiting. 04/29/21  Yes McLean-Scocuzza, Pasty Spillersracy N, MD  solifenacin (VESICARE) 5 MG tablet Take 1 tablet (5 mg total) by mouth daily. 05/03/21  Yes Glori LuisSonnenberg, Eric G, MD  spironolactone (ALDACTONE) 25 MG tablet Take 25 mg by mouth daily. 08/02/20  Yes [provider]  SUMAtriptan (IMITREX) 50 MG tablet Take 1 tablet (50 mg total) by mouth every 2 (two) hours as needed for migraine. May repeat in 2 hours if headache persists or recurs. No more than 2 doses in a 24 hour time span. 04/21/22  Yes Glori LuisSonnenberg, Eric G, MD  traZODone (DESYREL) 50 MG tablet Take 0.5-1 tablets (25-50 mg total) by mouth at bedtime as needed for sleep. 03/02/22  Yes Glori LuisSonnenberg, Eric G, MD  venlafaxine XR (EFFEXOR-XR) 150 MG 24 hr capsule TAKE ONE CAPSULE BY MOUTH DAILY WITH BREAKFAST 05/11/22  Yes Glori LuisSonnenberg, Eric G, MD  buPROPion [facility](WELLBUTRIN) 100 MG tablet TAKE ONE TABLET BY MOUTH THREE  TIMES A DAY 10/03/19 02/23/20  Glori LuisSonnenberg, Eric G, MD    Allergies: Allergies  Allergen Reactions   Penicillins Rash and Swelling   Oxycodone-Acetaminophen Rash   Tramadol Anxiety and Itching   Codeine Other (See Comments)    Chest pains   Tape Hives    Social History:  reports that she has never smoked. She has never used smokeless tobacco. She reports that she does not drink alcohol and does not use drugs.   Family History: Family History  Problem Relation Age of Onset   Arthritis Mother    Hypertension Mother    Mental illness Mother    Diabetes Mother    Alcohol abuse Father    Cancer Father    Hypertension Father    Mental illness Brother    Heart disease Maternal Grandmother    Hypertension Maternal Grandmother    Mental illness  Maternal Grandmother    Diabetes Maternal Grandmother    Cancer Maternal Grandfather    Hypertension Maternal Grandfather    Mental illness Paternal Grandmother     Review of Systems: Review of Systems  Constitutional:  Positive for diaphoresis (due to pain). Negative for chills and fever.  HENT:  Negative for hearing loss.   Respiratory:  Negative for shortness of breath.   Cardiovascular:  Negative for chest pain.  Gastrointestinal:  Positive for constipation and diarrhea. Negative for nausea and vomiting.  Genitourinary:  Negative for dysuria.  Musculoskeletal:  Negative for myalgias.  Skin:  Positive for itching (in perianal area). Negative for rash.  Neurological:  Negative for dizziness.  Psychiatric/Behavioral:  Negative for depression.     Physical Exam BP 132/81   Pulse 67   Temp 98.2 F (36.8 C) (Oral)   Ht 5' 7" (1.702 m)   Wt 258 lb 3.2 oz (117.1 kg)   SpO2 96%   BMI 40.44 kg/m  CONSTITUTIONAL: No acute distress HEENT:  Normocephalic, atraumatic, extraocular motion intact. NECK: Trachea is midline, and there is no jugular venous distension.  RESPIRATORY:  Normal respiratory effort without pathologic use of  accessory muscles. CARDIOVASCULAR: Regular rhythm and rate. RECTAL: External exam reveals some enlarged external hemorrhoids/skin tags in the left lateral and right anterior columns.  However these are not inflamed or thrombosed and are soft to palpation.  She is tender to palpation in these 2 areas but I do not see any inflammatory changes from hemorrhoids.  Due to her pain, I am unable to examine her well to the tell exactly where her anal fissure is located but she is tender while trying to do a rectal exam and her sphincter tone is tight as well. MUSCULOSKELETAL:  Normal muscle strength and tone in all four extremities.  No peripheral edema or cyanosis. SKIN: Skin turgor is normal. There are no pathologic skin lesions.  NEUROLOGIC:  Motor and sensation is grossly normal.  Cranial nerves are grossly intact. PSYCH:  Alert and oriented to person, place and time. Affect is normal.  Laboratory Analysis: Labs from 04/21/2022: Sodium 143, potassium 3.9, chloride 108, CO2 28.5, BUN 13, creatinine 0.49.  LFTs within normal limits.  WBC 4.4, hemoglobin 12.1, hematocrit 36.9, platelets 154.  Imaging: No results found.  Assessment and Plan: This is a 47 y.o. female with an anal fissure.  - Discussed with her that on exam, I am not able to fully elucidate an anal fissure but do see some enlarged external hemorrhoids/skin tags but there is no inflammatory changes in either 1 of those.  She does have tenderness to palpation in those areas and I am assuming that the fissure is in that vicinity.  Was unable to do rectal exam due to significant pain. - Discussed with her how anal fissures may form and the cycle of tearing with inflammation and spasm of the internal sphincter muscle with resultant decrease in blood flow to the anal mucosa to heal the tear.  Typically, nifedipine compound is the first step in treatment as this can help relax the muscle.  Discussed with patient that since nifedipine has not been  working for this, the next step in the treatment plan for an anal fissure would be doing a chemical sphincterotomy using Botox.  She is in agreement.   -Discussed with her the plan then for an exam under anesthesia with chemical sphincterotomy.  This will allow us to evaluate the anal canal better under anesthesia to evaluate   specifically for anal fissures or other potential etiologies for her pain.  If there is a fissure, then we will inject Botox into the internal sphincter.  Reviewed the surgery at length with her including the risks of bleeding, infection, injury to surrounding structures, that this would be an outpatient procedure, postoperative pain control, time off from work, and she is willing to proceed. - We will schedule her for surgery on 06/07/2022.  All of her questions have been answered.  I spent 40 minutes dedicated to the care of this patient on the date of this encounter to include pre-visit review of records, face-to-face time with the patient discussing diagnosis and management, and any post-visit coordination of care.   Howie Ill, MD [facility]

## 2022-06-02 NOTE — Patient Instructions (Addendum)
Our surgery scheduler Barbara will call you within 24-48 hours to get you scheduled. If you have not heard from her after 48 hours, please call our office. Have the blue sheet available when she calls to write down important information.   If you have any concerns or questions, please feel free to call our office.   Anal Fissure, Adult  An anal fissure is a small tear or crack in the tissue around the opening of the butt (anus). Bleeding from the tear or crack usually stops on its own within a few minutes. The bleeding may happen every time you poop (have a bowel movement) until the tear or crack heals. What are the causes? This condition is usually caused by passing a large or hard poop (stool). Other causes include: Trouble pooping (constipation). Passing watery poop (diarrhea). Inflammatory bowel disease (Crohn's disease or ulcerative colitis). Childbirth. Infections. Anal sex. What are the signs or symptoms? Symptoms of this condition include: Bleeding from the butt. Small amounts of blood on your poop. The blood coats the outside of the poop. It is not mixed with the poop. Small amounts of blood on the toilet paper or in the toilet after you poop. Pain when passing poop. Itching or irritation around the opening of the butt. How is this diagnosed? This condition may be diagnosed based on a physical exam. Your doctor may: Check your butt. A tear can often be seen by checking the area with care. Check your butt using a short tube (anoscope). The light in the tube will show any problems in your butt. How is this treated? Treatment for this condition may include: Treating problems that make it hard for you to pass poop. You may be told to: Eat more fiber. Drink more fluid. Take fiber supplements. Take medicines that make poop soft. Taking warm water baths (sitz baths). This may help to heal the tear. Using creams and ointments. If your condition gets worse, other treatments may be  needed such as: A shot near the tear or crack (botulinum injection). Surgery to repair the tear or crack. Follow these instructions at home: Eating and drinking  Avoid bananas and dairy products. These foods can make it hard to poop. Drink enough fluid to keep your pee (urine) pale yellow. Eat foods that have a lot of fiber in them, such as: Beans. Whole grains. Fresh fruits. Fresh vegetables. General instructions  Take over-the-counter and prescription medicines only as told by your doctor. Use creams or ointments only as told by your doctor. Keep the butt area as clean and dry as you can. Take a warm water bath as told by your doctor. Do not use soap. Keep all follow-up visits as told by your doctor. This is important. Contact a doctor if: You have more bleeding. You have a fever. You have watery poop that is mixed with blood. You have pain. Your problem gets worse, not better. Summary An anal fissure is a small tear or crack in the skin around the opening of the butt (anus). This condition is usually caused by passing a large or hard poop (stool). Treatment includes treating the problems that make it hard for you to pass poop. Follow your doctor's instructions about caring for your condition at home. Keep all follow-up visits as told by your doctor. This is important. This information is not intended to replace advice given to you by your health care provider. Make sure you discuss any questions you have with your health care provider. Document   Revised: 09/09/2020 Document Reviewed: 09/09/2020 Elsevier Patient Education  2023 [employer]  

## 2022-06-05 ENCOUNTER — Encounter
Admission: RE | Admit: 2022-06-05 | Discharge: 2022-06-05 | Disposition: A | Discharge status: Home / Self Care | Payer: BC Managed Care – PPO | Source: Ambulatory Visit | Attending: Surgery | Admitting: Surgery

## 2022-06-05 ENCOUNTER — Other Ambulatory Visit: Payer: Self-pay

## 2022-06-05 ENCOUNTER — Telehealth: Payer: Self-pay | Admitting: Surgery

## 2022-06-05 VITALS — Ht 67.0 in | Wt 258.0 lb

## 2022-06-05 DIAGNOSIS — I10 Essential (primary) hypertension: Secondary | ICD-10-CM

## 2022-06-05 DIAGNOSIS — R002 Palpitations: Secondary | ICD-10-CM

## 2022-06-05 DIAGNOSIS — Z01818 Encounter for other preprocedural examination: Secondary | ICD-10-CM

## 2022-06-05 HISTORY — DX: Nausea with vomiting, unspecified: R11.2

## 2022-06-05 HISTORY — DX: Anxiety disorder, unspecified: F41.9

## 2022-06-05 HISTORY — DX: Anemia, unspecified: D64.9

## 2022-06-05 HISTORY — DX: Personal history of urinary calculi: Z87.442

## 2022-06-05 HISTORY — DX: Personal history of other diseases of the digestive system: Z87.19

## 2022-06-05 HISTORY — DX: Depression, unspecified: F32.A

## 2022-06-05 HISTORY — DX: Rheumatoid arthritis, unspecified: M06.9

## 2022-06-05 HISTORY — DX: Other chronic pain: G89.29

## 2022-06-05 HISTORY — DX: Nausea with vomiting, unspecified: Z98.890

## 2022-06-05 NOTE — Telephone Encounter (Signed)
Patient calls back, she is informed of all dates regarding surgery.

## 2022-06-05 NOTE — Telephone Encounter (Signed)
Left message for patient to call, please inform her of the following regarding scheduled surgery with Dr. Aleen Campi.   Pre-Admission date/time, and Surgery date at [facility].  Surgery Date: 06/07/22 Preadmission Testing Date: 06/05/22 (phone 1p-4p)  Also patient will need to call at [phone], between 1-3:00pm the day before surgery, to find out what time to arrive for surgery.

## 2022-06-05 NOTE — Patient Instructions (Addendum)
Your procedure is scheduled on: Wednesday 06/07/22 To find out your arrival time, please call [phone] between 1PM - 3PM on:  Tuesday 06/06/22  Report to the Registration Desk on the 1st floor of the [facility]. Valet parking is available.  If your arrival time is 6:00 am, do not arrive before that time as the [facility] entrance doors do not open until 6:00 am.  REMEMBER: Instructions that are not followed completely may result in serious medical risk, up to and including death; or upon the discretion of your surgeon and anesthesiologist your surgery may need to be rescheduled.  Do not eat food after midnight the night before surgery.  No gum chewing or hard candies.  You may however, drink CLEAR liquids up to 2 hours before you are scheduled to arrive for your surgery. Do not drink anything within 2 hours of your scheduled arrival time.  Clear liquids include: - water  - apple juice without pulp - gatorade (not RED colors) - black coffee or tea (Do NOT add milk or creamers to the coffee or tea) Do NOT drink anything that is not on this list.  One week prior to surgery: Stop Anti-inflammatories (NSAIDS) such as Advil, Aleve, Ibuprofen, Motrin, Naproxen, Naprosyn and Aspirin based products such as Excedrin, Goody's Powder, BC Powder. You may however, continue to take Tylenol if needed for pain up until the day of surgery.  Stop ANY OVER THE COUNTER supplements until after surgery.  Continue taking all prescribed medications.   TAKE ONLY THESE MEDICATIONS THE MORNING OF SURGERY WITH A SIP OF WATER:  venlafaxine XR (EFFEXOR-XR) 150 MG 24 hr capsule  fexofenadine (ALLEGRA) 180 MG tablet  ALPRAZolam (XANAX) 1 MG tablet if needed You may use your nasal spray  No Alcohol for 24 hours before or after surgery.  No Smoking including e-cigarettes for 24 hours before surgery.  No chewable tobacco products for at least 6 hours before surgery.  No nicotine patches on the day of  surgery.  Do not use any "recreational" drugs for at least a week (preferably 2 weeks) before your surgery.  Please be advised that the combination of cocaine and anesthesia may have negative outcomes, up to and including death. If you test positive for cocaine, your surgery will be cancelled.  On the morning of surgery brush your teeth with toothpaste and water, you may rinse your mouth with mouthwash if you wish. Do not swallow any toothpaste or mouthwash.  Use CHG Soap or wipes as directed on instruction sheet. Shower with your regular soap.  Do not wear lotions, powders, or perfumes. You may wear deodorant.  Do not shave body hair from the neck down 48 hours before surgery.  Wear comfortable clothing (specific to your surgery type) to the hospital.  Do not wear jewelry, make-up, hairpins, clips or nail polish.  Contact lenses, hearing aids and dentures may not be worn into surgery.  Do not bring valuables to the hospital. [facility] is not responsible for any missing/lost belongings or valuables.   Notify your doctor if there is any change in your medical condition (cold, fever, infection).  If you are being discharged the day of surgery, you will not be allowed to drive home. You will need a responsible individual to drive you home and stay with you for 24 hours after surgery.   If you are taking public transportation, you will need to have a responsible individual with you.  If you are being admitted to the  hospital overnight, leave your suitcase in the car. After surgery it may be brought to your room.  In case of increased patient census, it may be necessary for you, the patient, to continue your postoperative care in the Same Day Surgery department.  After surgery, you can help prevent lung complications by doing breathing exercises.  Take deep breaths and cough every 1-2 hours. Your doctor may order a device called an Incentive Spirometer to help you take deep  breaths. When coughing or sneezing, hold a pillow firmly against your incision with both hands. This is called "splinting." Doing this helps protect your incision. It also decreases belly discomfort.  Surgery Visitation Policy:  Patients undergoing a surgery or procedure may have two family members or support persons with them as long as the person is not COVID-19 positive or experiencing its symptoms.   Inpatient Visitation:    Visiting hours are 7 a.m. to 8 p.m. Up to four visitors are allowed at one time in a patient room. The visitors may rotate out with other people during the day. One designated support person (adult) may remain overnight.  Please call the Pre-admissions Testing Dept. at [phone] if you have any questions about these instructions.  Pre op phone interview with patient. Chart reviewed. Medical/surgical history, medications, psychosocial, ADL's, and discharge plan reviewed with patient. Verbal and written instructions thru MyChart given. Patient verbalized understanding. Aware to call with any questions/concerns.

## 2022-06-06 MED ORDER — CHLORHEXIDINE GLUCONATE CLOTH 2 % EX PADS: 6.0000 | MEDICATED_PAD | Freq: Once | CUTANEOUS | Status: DC

## 2022-06-06 MED ORDER — BUPIVACAINE LIPOSOME 1.3 % IJ SUSP: 20.0000 mL | Freq: Once | INTRAMUSCULAR | Status: DC

## 2022-06-06 MED ORDER — FAMOTIDINE 20 MG PO TABS
20.0000 mg | ORAL_TABLET | Freq: Once | ORAL | Status: AC
  Administered 2022-06-07: 20 mg via ORAL

## 2022-06-06 MED ORDER — ACETAMINOPHEN 500 MG PO TABS
1000.0000 mg | ORAL_TABLET | ORAL | Status: AC
  Administered 2022-06-07: 1000 mg via ORAL

## 2022-06-06 MED ORDER — ORAL CARE MOUTH RINSE: 15.0000 mL | Freq: Once | OROMUCOSAL | Status: AC

## 2022-06-06 MED ORDER — CHLORHEXIDINE GLUCONATE 0.12 % MT SOLN
15.0000 mL | Freq: Once | OROMUCOSAL | Status: AC
  Administered 2022-06-07: 15 mL via OROMUCOSAL

## 2022-06-06 MED ORDER — LACTATED RINGERS IV SOLN: INTRAVENOUS | Status: DC

## 2022-06-06 MED ORDER — GABAPENTIN 300 MG PO CAPS
300.0000 mg | ORAL_CAPSULE | ORAL | Status: AC
  Administered 2022-06-07: 300 mg via ORAL

## 2022-06-06 MED ORDER — SODIUM CHLORIDE 0.9 % IV SOLN
2.0000 g | INTRAVENOUS | Status: AC
  Administered 2022-06-07: 2 g via INTRAVENOUS

## 2022-06-07 ENCOUNTER — Ambulatory Visit: Payer: BC Managed Care – PPO | Admitting: Anesthesiology

## 2022-06-07 ENCOUNTER — Encounter: Payer: Self-pay | Admitting: Surgery

## 2022-06-07 ENCOUNTER — Ambulatory Visit
Admission: RE | Admit: 2022-06-07 | Discharge: 2022-06-07 | Disposition: A | Discharge status: Home / Self Care | Payer: BC Managed Care – PPO | Attending: Surgery | Admitting: Surgery

## 2022-06-07 ENCOUNTER — Other Ambulatory Visit: Payer: Self-pay

## 2022-06-07 ENCOUNTER — Encounter
Admission: RE | Disposition: A | Discharge status: Home / Self Care | Payer: Self-pay | Source: Home / Self Care | Attending: Surgery

## 2022-06-07 DIAGNOSIS — K602 Anal fissure, unspecified: Secondary | ICD-10-CM | POA: Diagnosis present

## 2022-06-07 DIAGNOSIS — I10 Essential (primary) hypertension: Secondary | ICD-10-CM | POA: Diagnosis not present

## 2022-06-07 DIAGNOSIS — Z87440 Personal history of urinary (tract) infections: Secondary | ICD-10-CM | POA: Insufficient documentation

## 2022-06-07 DIAGNOSIS — F419 Anxiety disorder, unspecified: Secondary | ICD-10-CM | POA: Diagnosis not present

## 2022-06-07 DIAGNOSIS — K219 Gastro-esophageal reflux disease without esophagitis: Secondary | ICD-10-CM | POA: Diagnosis not present

## 2022-06-07 DIAGNOSIS — Z6841 Body Mass Index (BMI) 40.0 and over, adult: Secondary | ICD-10-CM | POA: Diagnosis not present

## 2022-06-07 DIAGNOSIS — Z793 Long term (current) use of hormonal contraceptives: Secondary | ICD-10-CM | POA: Diagnosis not present

## 2022-06-07 DIAGNOSIS — Z9884 Bariatric surgery status: Secondary | ICD-10-CM | POA: Diagnosis not present

## 2022-06-07 DIAGNOSIS — M069 Rheumatoid arthritis, unspecified: Secondary | ICD-10-CM | POA: Insufficient documentation

## 2022-06-07 DIAGNOSIS — F32A Depression, unspecified: Secondary | ICD-10-CM | POA: Diagnosis not present

## 2022-06-07 DIAGNOSIS — Z7951 Long term (current) use of inhaled steroids: Secondary | ICD-10-CM | POA: Insufficient documentation

## 2022-06-07 DIAGNOSIS — G8929 Other chronic pain: Secondary | ICD-10-CM | POA: Diagnosis not present

## 2022-06-07 DIAGNOSIS — Z79899 Other long term (current) drug therapy: Secondary | ICD-10-CM | POA: Diagnosis not present

## 2022-06-07 DIAGNOSIS — Z01818 Encounter for other preprocedural examination: Secondary | ICD-10-CM

## 2022-06-07 DIAGNOSIS — R002 Palpitations: Secondary | ICD-10-CM

## 2022-06-07 HISTORY — PX: BOTOX INJECTION: SHX5754

## 2022-06-07 HISTORY — PX: SPHINCTEROTOMY: SHX5279

## 2022-06-07 LAB — POCT PREGNANCY, URINE: Preg Test, Ur: NEGATIVE

## 2022-06-07 SURGERY — EXAM UNDER ANESTHESIA
Anesthesia: General | Site: Rectum

## 2022-06-07 MED ORDER — MIDAZOLAM HCL 2 MG/2ML IJ SOLN
INTRAMUSCULAR | Status: DC | PRN
  Administered 2022-06-07: 2 mg via INTRAVENOUS

## 2022-06-07 MED ORDER — CHLORHEXIDINE GLUCONATE 0.12 % MT SOLN
OROMUCOSAL | Status: AC
  Filled 2022-06-07: qty 15

## 2022-06-07 MED ORDER — HYDROCODONE-ACETAMINOPHEN 5-325 MG PO TABS
1.0000 | ORAL_TABLET | ORAL | 0 refills | Status: DC | PRN
Start: 2022-06-07 — End: 2022-08-14

## 2022-06-07 MED ORDER — BUPIVACAINE-EPINEPHRINE (PF) 0.5% -1:200000 IJ SOLN
INTRAMUSCULAR | Status: DC | PRN
  Administered 2022-06-07: 40 mL via INTRAMUSCULAR

## 2022-06-07 MED ORDER — OXYCODONE HCL 5 MG PO TABS
ORAL_TABLET | ORAL | Status: AC
  Filled 2022-06-07: qty 1

## 2022-06-07 MED ORDER — SCOPOLAMINE 1 MG/3DAYS TD PT72
1.0000 | MEDICATED_PATCH | TRANSDERMAL | Status: DC
  Administered 2022-06-07: 1.5 mg via TRANSDERMAL

## 2022-06-07 MED ORDER — MIDAZOLAM HCL 2 MG/2ML IJ SOLN
INTRAMUSCULAR | Status: AC
  Filled 2022-06-07: qty 2

## 2022-06-07 MED ORDER — PROPOFOL 1000 MG/100ML IV EMUL
INTRAVENOUS | Status: AC
  Filled 2022-06-07: qty 100

## 2022-06-07 MED ORDER — LACTATED RINGERS IV SOLN: INTRAVENOUS | Status: DC

## 2022-06-07 MED ORDER — BUPIVACAINE LIPOSOME 1.3 % IJ SUSP
INTRAMUSCULAR | Status: AC
  Filled 2022-06-07: qty 10

## 2022-06-07 MED ORDER — FENTANYL CITRATE (PF) 100 MCG/2ML IJ SOLN
INTRAMUSCULAR | Status: AC
  Filled 2022-06-07: qty 2

## 2022-06-07 MED ORDER — ROCURONIUM BROMIDE 100 MG/10ML IV SOLN
INTRAVENOUS | Status: DC | PRN
  Administered 2022-06-07: 10 mg via INTRAVENOUS
  Administered 2022-06-07: 50 mg via INTRAVENOUS

## 2022-06-07 MED ORDER — PROPOFOL 500 MG/50ML IV EMUL
INTRAVENOUS | Status: DC | PRN
  Administered 2022-06-07: 150 ug/kg/min via INTRAVENOUS

## 2022-06-07 MED ORDER — FENTANYL CITRATE (PF) 100 MCG/2ML IJ SOLN
INTRAMUSCULAR | Status: DC | PRN
  Administered 2022-06-07 (×2): 25 ug via INTRAVENOUS
  Administered 2022-06-07: 50 ug via INTRAVENOUS

## 2022-06-07 MED ORDER — OXYCODONE HCL 5 MG PO TABS
5.0000 mg | ORAL_TABLET | Freq: Once | ORAL | Status: AC
  Administered 2022-06-07: 5 mg via ORAL

## 2022-06-07 MED ORDER — FAMOTIDINE 20 MG PO TABS
ORAL_TABLET | ORAL | Status: AC
  Filled 2022-06-07: qty 1

## 2022-06-07 MED ORDER — SUGAMMADEX SODIUM 200 MG/2ML IV SOLN
INTRAVENOUS | Status: DC | PRN
  Administered 2022-06-07: 200 mg via INTRAVENOUS

## 2022-06-07 MED ORDER — SCOPOLAMINE 1 MG/3DAYS TD PT72
MEDICATED_PATCH | TRANSDERMAL | Status: AC
  Filled 2022-06-07: qty 1

## 2022-06-07 MED ORDER — ONDANSETRON HCL 4 MG/2ML IJ SOLN: 4.0000 mg | Freq: Once | INTRAMUSCULAR | Status: DC | PRN

## 2022-06-07 MED ORDER — BUPIVACAINE-EPINEPHRINE (PF) 0.5% -1:200000 IJ SOLN
INTRAMUSCULAR | Status: AC
  Filled 2022-06-07: qty 30

## 2022-06-07 MED ORDER — LIDOCAINE HCL (CARDIAC) PF 100 MG/5ML IV SOSY
PREFILLED_SYRINGE | INTRAVENOUS | Status: DC | PRN
  Administered 2022-06-07: 100 mg via INTRAVENOUS

## 2022-06-07 MED ORDER — SODIUM CHLORIDE 0.9 % IV SOLN
INTRAVENOUS | Status: AC
  Filled 2022-06-07: qty 2

## 2022-06-07 MED ORDER — DEXAMETHASONE SODIUM PHOSPHATE 10 MG/ML IJ SOLN
INTRAMUSCULAR | Status: DC | PRN
  Administered 2022-06-07: 10 mg via INTRAVENOUS

## 2022-06-07 MED ORDER — LIDOCAINE HCL URETHRAL/MUCOSAL 2 % EX GEL
CUTANEOUS | Status: AC
  Filled 2022-06-07: qty 10

## 2022-06-07 MED ORDER — 0.9 % SODIUM CHLORIDE (POUR BTL) OPTIME
TOPICAL | Status: DC | PRN
  Administered 2022-06-07: 1000 mL

## 2022-06-07 MED ORDER — PROPOFOL 10 MG/ML IV BOLUS
INTRAVENOUS | Status: DC | PRN
  Administered 2022-06-07: 200 mg via INTRAVENOUS

## 2022-06-07 MED ORDER — GELATIN ABSORBABLE 100 CM EX MISC
CUTANEOUS | Status: AC
  Filled 2022-06-07: qty 1

## 2022-06-07 MED ORDER — DEXMEDETOMIDINE HCL IN NACL 80 MCG/20ML IV SOLN
INTRAVENOUS | Status: DC | PRN
  Administered 2022-06-07: 8 ug via INTRAVENOUS
  Administered 2022-06-07 (×3): 4 ug via INTRAVENOUS

## 2022-06-07 MED ORDER — GABAPENTIN 300 MG PO CAPS
ORAL_CAPSULE | ORAL | Status: AC
  Filled 2022-06-07: qty 1

## 2022-06-07 MED ORDER — ONDANSETRON HCL 4 MG/2ML IJ SOLN
INTRAMUSCULAR | Status: DC | PRN
  Administered 2022-06-07: 4 mg via INTRAVENOUS

## 2022-06-07 MED ORDER — ACETAMINOPHEN 10 MG/ML IV SOLN: 1000.0000 mg | Freq: Once | INTRAVENOUS | Status: DC | PRN

## 2022-06-07 MED ORDER — FENTANYL CITRATE (PF) 100 MCG/2ML IJ SOLN
25.0000 ug | INTRAMUSCULAR | Status: DC | PRN
  Administered 2022-06-07 (×4): 25 ug via INTRAVENOUS

## 2022-06-07 MED ORDER — ACETAMINOPHEN 500 MG PO TABS
ORAL_TABLET | ORAL | Status: AC
  Filled 2022-06-07: qty 2

## 2022-06-07 MED ORDER — SODIUM CHLORIDE FLUSH 0.9 % IV SOLN
INTRAVENOUS | Status: AC
  Filled 2022-06-07: qty 20

## 2022-06-07 MED ORDER — LIDOCAINE 5 % EX OINT: 1.0000 | TOPICAL_OINTMENT | Freq: Four times a day (QID) | CUTANEOUS | 0 refills | Status: AC | PRN

## 2022-06-07 MED ORDER — ONABOTULINUMTOXINA 100 UNITS IJ SOLR
INTRAMUSCULAR | Status: DC | PRN
  Administered 2022-06-07: 50 [IU] via INTRAMUSCULAR

## 2022-06-07 SURGICAL SUPPLY — 35 items
BRIEF MESH DISP 2XL (UNDERPADS AND DIAPERS) ×1 IMPLANT
DRAPE PERI LITHO V/GYN (MISCELLANEOUS) ×1 IMPLANT
DRSG GAUZE FLUFF 36X18 (GAUZE/BANDAGES/DRESSINGS) ×1 IMPLANT
DRSG TEGADERM 6X8 (GAUZE/BANDAGES/DRESSINGS) IMPLANT
ELECT CAUTERY BLADE TIP 2.5 (TIP) ×1
ELECT REM PT RETURN 9FT ADLT (ELECTROSURGICAL) ×1
ELECTRODE CAUTERY BLDE TIP 2.5 (TIP) ×1 IMPLANT
ELECTRODE REM PT RTRN 9FT ADLT (ELECTROSURGICAL) ×1 IMPLANT
GAUZE 4X4 16PLY ~~LOC~~+RFID DBL (SPONGE) ×1 IMPLANT
GLOVE SURG SYN 7.0 (GLOVE) ×1 IMPLANT
GLOVE SURG SYN 7.0 PF PI (GLOVE) ×1 IMPLANT
GLOVE SURG SYN 7.5  E (GLOVE) ×1
GLOVE SURG SYN 7.5 E (GLOVE) ×1 IMPLANT
GLOVE SURG SYN 7.5 PF PI (GLOVE) ×1 IMPLANT
GOWN STRL REUS W/ TWL LRG LVL3 (GOWN DISPOSABLE) ×2 IMPLANT
GOWN STRL REUS W/TWL LRG LVL3 (GOWN DISPOSABLE) ×2
KIT TURNOVER KIT A (KITS) ×1 IMPLANT
LABEL OR SOLS (LABEL) ×1 IMPLANT
MANIFOLD NEPTUNE II (INSTRUMENTS) ×1 IMPLANT
NDL HYPO 22X1.5 SAFETY MO (MISCELLANEOUS) ×1 IMPLANT
NEEDLE HYPO 22X1.5 SAFETY MO (MISCELLANEOUS) ×1 IMPLANT
NS IRRIG 500ML POUR BTL (IV SOLUTION) ×1 IMPLANT
PACK BASIN MINOR ARMC (MISCELLANEOUS) ×1 IMPLANT
PAD OB MATERNITY 4.3X12.25 (PERSONAL CARE ITEMS) ×1 IMPLANT
PAD PREP 24X41 OB/GYN DISP (PERSONAL CARE ITEMS) ×1 IMPLANT
SOL PREP PVP 2OZ (MISCELLANEOUS) ×1
SOLUTION PREP PVP 2OZ (MISCELLANEOUS) ×1 IMPLANT
SURGILUBE 2OZ TUBE FLIPTOP (MISCELLANEOUS) ×1 IMPLANT
SUT VIC AB 2-0 SH 27 (SUTURE)
SUT VIC AB 2-0 SH 27XBRD (SUTURE) ×2 IMPLANT
SYR 10ML LL (SYRINGE) ×2 IMPLANT
SYR BULB IRRIG 60ML STRL (SYRINGE) ×1 IMPLANT
SYR TB 1ML LL NO SAFETY (SYRINGE) IMPLANT
TRAP FLUID SMOKE EVACUATOR (MISCELLANEOUS) ×1 IMPLANT
WATER STERILE IRR 500ML POUR (IV SOLUTION) ×1 IMPLANT

## 2022-06-07 NOTE — Anesthesia Postprocedure Evaluation (Signed)
Anesthesia Post Note  Patient: Jill Hawkins  Procedure(s) Performed: EXAM UNDER ANESTHESIA (Rectum) SPHINCTEROTOMY, botox chemical (Rectum) BOTOX INJECTION (Rectum)  Patient location during evaluation: PACU Anesthesia Type: General Level of consciousness: awake and alert, oriented and patient cooperative Pain management: pain level controlled Vital Signs Assessment: post-procedure vital signs reviewed and stable Respiratory status: spontaneous breathing, nonlabored ventilation and respiratory function stable Cardiovascular status: blood pressure returned to baseline and stable Postop Assessment: adequate PO intake Anesthetic complications: no   No notable events documented.   Last Vitals:  Vitals:   06/07/22 1045 06/07/22 1100  BP: 114/62 101/63  Pulse: (!) 57   Resp: (!) 24   Temp:  36.8 C  SpO2: 100%     Last Pain:  Vitals:   06/07/22 1100  TempSrc:   PainSc: 7                  Reed Breech

## 2022-06-07 NOTE — Anesthesia Procedure Notes (Signed)
Procedure Name: Intubation Date/Time: 06/07/2022 9:48 AM  Performed by: Joanette Gula, Dallyn Bergland, CRNAPre-anesthesia Checklist: Patient identified, Emergency Drugs available, Suction available and Patient being monitored Patient Re-evaluated:Patient Re-evaluated prior to induction Oxygen Delivery Method: Circle system utilized Preoxygenation: Pre-oxygenation with 100% oxygen Induction Type: IV induction Ventilation: Mask ventilation without difficulty Laryngoscope Size: McGraph and 3 Grade View: Grade I Tube type: Oral Tube size: 7.0 mm Number of attempts: 1 Airway Equipment and Method: Stylet Placement Confirmation: ETT inserted through vocal cords under direct vision, positive ETCO2 and breath sounds checked- equal and bilateral Secured at: 19 cm Tube secured with: Tape Dental Injury: Teeth and Oropharynx as per pre-operative assessment

## 2022-06-07 NOTE — Interval H&P Note (Signed)
History and Physical Interval Note:  06/07/2022 9:15 AM  Jill Hawkins  has presented today for surgery, with the diagnosis of anal fissure.  The various methods of treatment have been discussed with the patient and family. After consideration of risks, benefits and other options for treatment, the patient has consented to  Procedure(s): EXAM UNDER ANESTHESIA (N/A) SPHINCTEROTOMY, botox chemical (N/A) BOTOX INJECTION (N/A) as a surgical intervention.  The patient's history has been reviewed, patient examined, no change in status, stable for surgery.  I have reviewed the patient's chart and labs.  Questions were answered to the patient's satisfaction.     Tiny Chaudhary

## 2022-06-07 NOTE — Anesthesia Preprocedure Evaluation (Addendum)
Anesthesia Evaluation  Patient identified by MRN, date of birth, ID band Patient awake    Reviewed: Allergy & Precautions, NPO status , Patient's Chart, lab work & pertinent test results  History of Anesthesia Complications (+) PONV and history of anesthetic complications  Airway Mallampati: I   Neck ROM: Full    Dental no notable dental hx.    Pulmonary neg pulmonary ROS   Pulmonary exam normal breath sounds clear to auscultation       Cardiovascular hypertension (resolved after gastric bypass), Normal cardiovascular exam Rhythm:Regular Rate:Normal     Neuro/Psych  Headaches PSYCHIATRIC DISORDERS Anxiety Depression    Chronic pain    GI/Hepatic hiatal hernia,GERD  ,,S/p gastric bypass 2015   Endo/Other  Class 3 obesity  Renal/GU Renal disease (nephrolithiasis)     Musculoskeletal   Abdominal   Peds  Hematology  (+) Blood dyscrasia, anemia   Anesthesia Other Findings   Reproductive/Obstetrics                             Anesthesia Physical Anesthesia Plan  ASA: 3  Anesthesia Plan: General   Post-op Pain Management:    Induction: Intravenous  PONV Risk Score and Plan: 4 or greater and Ondansetron, Dexamethasone, Treatment may vary due to age or medical condition and Scopolamine patch - Pre-op  Airway Management Planned: Oral ETT  Additional Equipment:   Intra-op Plan:   Post-operative Plan: Extubation in OR  Informed Consent: I have reviewed the patients History and Physical, chart, labs and discussed the procedure including the risks, benefits and alternatives for the proposed anesthesia with the patient or authorized representative who has indicated his/her understanding and acceptance.     Dental advisory given  Plan Discussed with: CRNA  Anesthesia Plan Comments: (Patient consented for risks of anesthesia including but not limited to:  - adverse reactions to  medications - damage to eyes, teeth, lips or other oral mucosa - nerve damage due to positioning  - sore throat or hoarseness - damage to heart, brain, nerves, lungs, other parts of body or loss of life  Informed patient about role of CRNA in peri- and intra-operative care.  Patient voiced understanding.)        Anesthesia Quick Evaluation

## 2022-06-07 NOTE — Op Note (Signed)
Procedure Date:  06/07/2022  Pre-operative Diagnosis:  Anal fissure  Post-operative Diagnosis: Posterior anal fissure  Procedure:  Exam under Anesthesia, chemical sphincterotomy with Botox  Surgeon:  Howie Ill, MD  Anesthesia:  General endotracheal  Estimated Blood Loss:  5 ml  Specimens:  None  Complications:  None  Indications for Procedure:  This is a 47 y.o. female with a history of anal fissure, not responding to medical treatment with nifedipine.  She presents now for chemical sphincterotomy with Botox.  The risks of bleeding, infection, bowel injury, and need for further procedures were all discussed with the patient and she was willing to proceed.   Description of Procedure: The patient was correctly identified in the preoperative area and brought into the operating room.  The patient was placed supine with VTE prophylaxis in place.  Appropriate time-outs were performed.  Anesthesia was induced and the patient was intubated.  Appropriate antibiotics were infused.  The patient was then placed in high lithotomy position.   The perianal area was prepped and draped in usual sterile fashion.  The sphincter was digitally dilated and found to be tight at baseline, consistent with anal fissure.  Then the anoscope was inserted and the anal canal was evaluated, revealing a larger left posterior anal fissure measuring about 1 cm and a smaller mid posterior fissure measuring about 2 mm.  There were no other fissures or tears, and otherwise only mildly enlarged external hemorrhoids of all columns and some hypertrophied anal papilla.  We proceeded with injection of 50 units of Botox into the internal sphincter musculature adjacent to the fissures and in bilateral lateral positions.  Then, 40 ml of Exparel solution mixed with 0.5% bupivacaine was infiltrated into the perianal area and as bilateral pudendal blocks.  The perianal area was cleaned and dressed with fluffed gauze and mesh  underwear.  The patient was emerged from anesthesia and extubated and brought to the recovery room for further management.  The patient tolerated the procedure well and all counts were correct at the end of the case.   Howie Ill, MD

## 2022-06-07 NOTE — Transfer of Care (Signed)
Immediate Anesthesia Transfer of Care Note  Patient: Jill Hawkins  Procedure(s) Performed: Francia Greaves UNDER ANESTHESIA (Rectum) SPHINCTEROTOMY, botox chemical (Rectum) BOTOX INJECTION (Rectum)  Patient Location: PACU  Anesthesia Type:General  Level of Consciousness: awake, alert , and oriented  Airway & Oxygen Therapy: Patient Spontanous Breathing and Patient connected to face mask oxygen  Post-op Assessment: Report given to RN and Post -op Vital signs reviewed and stable  Post vital signs: Reviewed and stable  Last Vitals:  Vitals Value Taken Time  BP 117/67 06/07/22 1042  Temp    Pulse 58   Resp 10 06/07/22 1043  SpO2 100   Vitals shown include unvalidated device data.  Last Pain:  Vitals:   06/07/22 0917  TempSrc: Oral  PainSc: 8          Complications: No notable events documented.

## 2022-06-07 NOTE — Discharge Instructions (Signed)

## 2022-06-08 ENCOUNTER — Encounter: Payer: Self-pay | Admitting: Surgery

## 2022-06-26 ENCOUNTER — Ambulatory Visit (INDEPENDENT_AMBULATORY_CARE_PROVIDER_SITE_OTHER): Payer: BC Managed Care – PPO | Admitting: Surgery

## 2022-06-26 ENCOUNTER — Encounter: Payer: Self-pay | Admitting: Surgery

## 2022-06-26 VITALS — BP 108/77 | HR 62 | Temp 98.3°F | Ht 67.0 in | Wt 249.6 lb

## 2022-06-26 DIAGNOSIS — Z09 Encounter for follow-up examination after completed treatment for conditions other than malignant neoplasm: Secondary | ICD-10-CM

## 2022-06-26 DIAGNOSIS — K602 Anal fissure, unspecified: Secondary | ICD-10-CM | POA: Diagnosis not present

## 2022-06-26 NOTE — Patient Instructions (Signed)
If you have any concerns or questions, please feel free to call our office See follow up appointment below.   OnabotulinumtoxinA Injection (Medical Use) What is this medication? ONABOTULINUMTOXINA (o na BOTT you lye num tox in eh) treats severe muscle spasms. It may also be used to prevent migraine headaches. It can treat excessive sweating when other medications do not work well enough. This medicine may be used for other purposes; ask your health care provider or pharmacist if you have questions. COMMON BRAND NAME(S): Botox What should I tell my care team before I take this medication? They need to know if you have any of these conditions: Breathing problems Cerebral palsy spasms Difficulty urinating Heart problems History of surgery where this medication is going to be used Infection at the site where this medication is going to be used Myasthenia gravis or other neurologic disease Nerve or muscle disease Surgery plans Take medications that treat or prevent blood clots Thyroid problems An unusual or allergic reaction to botulinum toxin, albumin, other medications, foods, dyes, or preservatives Pregnant or trying to get pregnant Breast-feeding How should I use this medication? This medication is for injected into a muscle. It is given by your care team in a hospital or clinic setting. A special MedGuide will be given to you before each treatment. Be sure to read this information carefully each time. Talk to your care team about the use of this medication in children. While this medication may be prescribed for children as young as 2 years for selected conditions, precautions do apply. Overdosage: If you think you have taken too much of this medicine contact a poison control center or emergency room at once. NOTE: This medicine is only for you. Do not share this medicine with others. What if I miss a dose? This does not apply. What may interact with this medication? Aminoglycoside  antibiotics, such as gentamicin, neomycin, tobramycin Muscle relaxants Other botulinum toxin injections This list may not describe all possible interactions. Give your health care provider a list of all the medicines, herbs, non-prescription drugs, or dietary supplements you use. Also tell them if you smoke, drink alcohol, or use illegal drugs. Some items may interact with your medicine. What should I watch for while using this medication? Visit your care team for regular check ups. This medication will cause weakness in the muscle where it is injected. Tell your care team if you feel unusually weak in other muscles. Get medical help right away if you have problems with breathing, swallowing, or talking. This medication might make your eyelids droop or make you see blurry or double. If you have weak muscles or trouble seeing do not drive a car, use machinery, or do other dangerous activities. This medication contains albumin from human blood. It may be possible to pass an infection in this medication, but no cases have been reported. Talk to your care team about the risks and benefits of this medication. If your activities have been limited by your condition, go back to your regular routine slowly after treatment with this medication. What side effects may I notice from receiving this medication? Side effects that you should report to your care team as soon as possible: Allergic reactions--skin rash, itching, hives, swelling of the face, lips, tongue, or throat Dryness or irritation of the eyes, eye pain, change in vision, sensitivity to light Infection--fever, chills, cough, sore throat, wounds that don't heal, pain or trouble when passing urine, general feeling of discomfort or being unwell Spread of  botulinum toxin effects--unusual weakness or fatigue, blurry or double vision, trouble swallowing, hoarseness or trouble speaking, trouble breathing, loss of bladder control Trouble passing urine Side  effects that usually do not require medical attention (report these to your care team if they continue or are bothersome): Dry mouth Eyelid drooping Fatigue Headache Pain, redness, or irritation at injection site This list may not describe all possible side effects. Call your doctor for medical advice about side effects. You may report side effects to FDA at 1-800-FDA-1088. Where should I keep my medication? This medication is given in a hospital or clinic and will not be stored at home. NOTE: This sheet is a summary. It may not cover all possible information. If you have questions about this medicine, talk to your doctor, pharmacist, or health care provider.  2023 Elsevier/Gold Standard (2021-02-10 00:00:00)

## 2022-06-26 NOTE — Progress Notes (Signed)
06/26/2022  HPI: Jill Hawkins is a 47 y.o. female s/p EUA and botox chemical sphincterotomy on 06/07/22.  Patient presents for follow up.  Reports that she's doing a lot better than before.  Denies any worsening or major anal pain.  Denies any bleeding.  Her bowel movements are better and is taking fiber and miralax.  She still has some itching in the perianal area.  Vital signs: BP 108/77   Pulse 62   Temp 98.3 F (36.8 C) (Oral)   Ht 5\' 7"  (1.702 m)   Wt 249 lb 9.6 oz (113.2 kg)   SpO2 98%   BMI 39.09 kg/m    Physical Exam: Constitutional:  No acute distress Rectal:  External exam reveals stable enlarged external hemorrhoids without any inflammatory changes. The posterior anal fissure is almost healed, with minimal opening visible.  No bleeding noted.  Digital rectal exam attempted and was better than prior, though still with some tenderness so was aborted.  Assessment/Plan: This is a 47 y.o. female s/p EUA and botox chemical sphincterotomy  --Patient is very improved after botox injection.  The fissure is healing and the rectal tone is better.  There's still some tenderness but much improved compared to preop.  The patient is very satisfied with the current progress. --For now, continue with fiber and miralax, and we'll follow up in about a month to see how things are progressing.  Discussed that once the fissure is fully healed we can pay attention to any residual symptoms which may be related to the enlarged external hemorrhoids.  If needed could do hemorrhoidectomy in the future, but first we'll see how she continues to progress. --Follow up in a month.   Howie Ill, MD [facility]

## 2022-07-05 ENCOUNTER — Other Ambulatory Visit: Payer: BC Managed Care – PPO

## 2022-07-05 ENCOUNTER — Encounter: Payer: Self-pay | Admitting: Family Medicine

## 2022-07-19 ENCOUNTER — Other Ambulatory Visit: Payer: Self-pay | Admitting: Family Medicine

## 2022-07-19 MED ORDER — ALPRAZOLAM 1 MG PO TABS: ORAL_TABLET | ORAL | 0 refills | Status: DC

## 2022-07-19 NOTE — Telephone Encounter (Signed)
Sent to pharmacy 

## 2022-07-19 NOTE — Telephone Encounter (Signed)
Patient comment: Please send as soon as possible. Leaving town at noon to fly out for a trip and just realized I am out.   LOV: 05/31/22  NOV: 10/02/22

## 2022-07-19 NOTE — Telephone Encounter (Signed)
Prescription Request  07/19/2022  LOV: 05/31/2022  What is the name of the medication or equipment? ALPRAZolam Prudy Feeler) 1 MG tablet  Have you contacted your pharmacy to request a refill? Yes   Which pharmacy would you like this sent to?   [id number] Dan Humphreys,  - [address] [address] AT [facility] OF [address] [place] [address] Phone: [phone] Fax: [phone]    Patient notified that their request is being sent to the clinical staff for review and that they should receive a response within 2 business days.   Please advise at Mobile [phone] (mobile)

## 2022-07-31 ENCOUNTER — Other Ambulatory Visit: Payer: Self-pay | Admitting: Family Medicine

## 2022-07-31 ENCOUNTER — Encounter: Payer: BC Managed Care – PPO | Admitting: Surgery

## 2022-07-31 DIAGNOSIS — G8929 Other chronic pain: Secondary | ICD-10-CM

## 2022-07-31 DIAGNOSIS — R519 Headache, unspecified: Secondary | ICD-10-CM

## 2022-08-14 ENCOUNTER — Telehealth: Payer: Self-pay

## 2022-08-14 ENCOUNTER — Encounter: Payer: Self-pay | Admitting: Surgery

## 2022-08-14 ENCOUNTER — Ambulatory Visit (INDEPENDENT_AMBULATORY_CARE_PROVIDER_SITE_OTHER): Payer: BC Managed Care – PPO | Admitting: Surgery

## 2022-08-14 VITALS — BP 120/76 | HR 63 | Temp 98.1°F | Ht 67.0 in | Wt 261.4 lb

## 2022-08-14 DIAGNOSIS — K644 Residual hemorrhoidal skin tags: Secondary | ICD-10-CM

## 2022-08-14 DIAGNOSIS — K602 Anal fissure, unspecified: Secondary | ICD-10-CM | POA: Diagnosis not present

## 2022-08-14 NOTE — Telephone Encounter (Signed)
Called patient to inquire about cardiac hx. Patient states that she has seen Dr. Welton Flakes from [facility] in the past. The patient states that she will have records sent to the office. Patient also states that she will have to call back and reschedule her appointment that is scheduled on 08/22/22 with Dr. Kirke Corin.

## 2022-08-14 NOTE — Patient Instructions (Addendum)
You have requested to have Hemorrhoid surgery today. This will be scheduled at [facility] with Dr Aleen Campi.  Please review the information below and your [facility] Information. Our surgery scheduler will call you to review surgery date and to go over information.  If you have FMLA or disability paperwork that needs filled out you may drop this off at our office or this can be faxed to [phone].  You will be required to do 2 enemas prior to your surgery. The first will be the night prior and the second will be done the morning of surgery.  Constipation is going to be your biggest obstacle following surgery. Use all stool softeners and laxatives as prescribed after your surgery and be sure to drink 72 ounces of water or more every day. This will help to avoid constipation. If you do all of this and you are still having difficulty, please call our office for further instructions as soon as you begin to have difficulty with bowel movements.  You may want to buy a disposable Sitz bath prior to surgery to aid in pain and cleanliness after surgery. The information on how to do use a disposable sitz bath or using a bath tub are below.  You will be out of work 1-2 weeks depending on how your healing goes. If you have FMLA/Disability paperwork that needs filled out you may drop this off at the office or fax it to [phone].   Hemorrhoid Surgery After Care Refer to this sheet in the next few weeks. These instructions provide you with information about caring for yourself after your procedure. Your health care provider may also give you more specific instructions. Your treatment has been planned according to current medical practices, but problems sometimes occur. Call your health care provider if you have any problems or questions after your procedure. What can I expect after the procedure? After the procedure, it is common to have: Rectal pain. Pain when you are having a bowel movement. Slight  rectal bleeding.  Follow these instructions at home: Medicines Take over-the-counter and prescription medicines only as told by your health care provider. Do not drive or operate heavy machinery while taking prescription pain medicine. Use a stool softener or a bulk laxative as told by your health care provider. Activity Rest at home. Return to your normal activities as told by your health care provider. Do not lift anything that is heavier than 20 lb. Do not sit for long periods of time. Take a walk every day or as told by your health care provider. Do not strain to have a bowel movement. Do not spend a long time sitting on the toilet. Eating and drinking Eat foods that contain fiber, such as whole grains, beans, nuts, fruits, and vegetables. Drink enough fluid to keep your urine clear or pale yellow. General instructions Sit in a warm bath 2-3 times per day to relieve soreness or itching. Keep all follow-up visits as told by your health care provider. This is important. Contact a health care provider if: Your pain medicine is not helping. You have a fever or chills. You become constipated. You have trouble passing urine. Get help right away if: You have very bad rectal pain. You have heavy bleeding from your rectum. This information is not intended to replace advice given to you by your health care provider. Make sure you discuss any questions you have with your health care provider. Document Released: 05/06/2003 Document Revised: 07/22/2015 Document Reviewed: 05/11/2014 Elsevier Interactive Patient Education  2018 Elsevier Inc.   Disposable Sitz Bath A disposable sitz bath is a plastic basin that fits over the toilet. A bag is hung above the toilet, and the bag is connected to a tube that opens into the basin. The bag is filled with warm water that flows into the basin through the tube. A sitz bath can be used to help relieve symptoms, clean, and promote healing in the genital and  anal areas, as well as in the lower abdomen and buttocks. What are the risks? Sitz baths are generally very safe. It is possible for the skin between the genitals and the anus (perineum) to become infected, but this is rare. You can avoid this by cleaning your sitz bath supplies thoroughly. How to use a disposable sitz bath Close the clamp on the tube. Make sure the clamp is closed tightly to prevent leakage. Fill the sitz bath basin and the plastic bag with warm water. The water should be warm enough to be comfortable, but not hot. Raise the toilet seat and place the filled basin on the toilet. Make sure the overflow opening is facing toward the back of the toilet. If you prefer, you may place the empty basin on the toilet first, and then use the plastic bag to fill the basin with warm water. Hang the filled plastic bag overhead on a hook or towel rack close to the toilet. The bag should be higher than the toilet so that the water will flow down through the tube. Attach the tube to the opening on the basin. Make sure that the tube is attached to the basin tightly to prevent leakage. Sit on the basin and release the clamp. This will allow warm water to flow into the basin and flush the area around your genitals and anus. Remain sitting on the basin for about 15-20 minutes, or as long as told by your health care provider. Stand up and gently pat your skin dry. If directed, apply clean bandages (dressings) to the affected area as told by your health care provider. Carefully remove the basin from the toilet seat and tip the basin into the toilet to empty any remaining water. Empty any remaining water from the plastic bag into the toilet. Then, flush the toilet. Wash the basin with warm water and soap. Let the basin air dry in the sink. You should also let the plastic bag and the tubing air dry. Store the basin, tubing, and plastic bag in a clean, dry area. Wash your hands with soap and water. If soap  and water are not available, use hand sanitizer. Contact a health care provider if: You have symptoms that get worse instead of better. You develop new skin irritation, redness, or swelling around your genitals or anus. This information is not intended to replace advice given to you by your health care provider. Make sure you discuss any questions you have with your health care provider. Document Released: 08/15/2011 Document Revised: 07/22/2015 Document Reviewed: 01/03/2015 Elsevier Interactive Patient Education  2018 [employer].   How to Take a [employer] A sitz bath is a warm water bath that is taken while you are sitting down. The water should only come up to your hips and should cover your buttocks. Your health care provider may recommend a sitz bath to help you: Clean the lower part of your body, including your genital area. With itching. With pain. With sore muscles or muscles that tighten or spasm.  How to take a sitz  bath Take 3-4 sitz baths per day or as told by your health care provider. Partially fill a bathtub with warm water. You will only need the water to be deep enough to cover your hips and buttocks when you are sitting in it. If your health care provider told you to put medicine in the water, follow the directions exactly. Sit in the water and open the tub drain a little. Turn on the warm water again to keep the tub at the correct level. Keep the water running constantly. Soak in the water for 15-20 minutes or as told by your health care provider. After the sitz bath, pat the affected area dry first. Do not rub it. Be careful when you stand up after the sitz bath because you may feel dizzy.  Contact a health care provider if: Your symptoms get worse. Do not continue with sitz baths if your symptoms get worse. You have new symptoms. Do not continue with sitz baths until you talk with your health care provider. This information is not intended to replace advice given  to you by your health care provider. Make sure you discuss any questions you have with your health care provider. Document Released: 11/06/2003 Document Revised: 07/14/2015 Document Reviewed: 02/11/2014 Elsevier Interactive Patient Education  [employer].

## 2022-08-15 ENCOUNTER — Encounter: Payer: Self-pay | Admitting: Surgery

## 2022-08-15 NOTE — Progress Notes (Signed)
08/14/2022  History of Present Illness: Jill Hawkins is a 47 y.o. female status post exam under anesthesia and chemical sphincterotomy with Botox on 06/07/2022.  Patient presents today for follow-up.  She reports today that she is doing very well from the anal fissure standpoint.  She denies having any worsening pain issues and denies any bleeding.  However she still reports issues with her enlarged external hemorrhoids.  She reports that she still having issues with hygiene resulting itching and irritation because she is not able to clean well due to the size of her hemorrhoids.  She would like to have this addressed.  Past Medical History: Past Medical History:  Diagnosis Date   Anemia    hx iron infusions   Anxiety    Chronic pain    r/t ddd   Depression    Frequent headaches    GERD (gastroesophageal reflux disease)    History of fainting spells of unknown cause    History of hiatal hernia    History of kidney stones    Hypertension    no issues since gastric bypass   Migraine    PONV (postoperative nausea and vomiting)    Rheumatoid arthritis (HCC)    UTI (lower urinary tract infection)      Past Surgical History: Past Surgical History:  Procedure Laterality Date   BOTOX INJECTION N/A 06/07/2022   Procedure: BOTOX INJECTION;  Surgeon: Henrene Dodge, MD;  Location: [facility] ORS;  Service: General;  Laterality: N/A;   CARPAL TUNNEL RELEASE  02/28/2003   CESAREAN SECTION     x 2   CHOLECYSTECTOMY  02/27/2013   GASTRIC BYPASS  04/14/2013   GROWTH PLATE SURGERY  16/11/9602   Right hip   HIATAL HERNIA REPAIR     SPHINCTEROTOMY N/A 06/07/2022   Procedure: SPHINCTEROTOMY, botox chemical;  Surgeon: Henrene Dodge, MD;  Location: [facility] ORS;  Service: General;  Laterality: N/A;    Home Medications: Prior to Admission medications   Medication Sig Start Date End Date Taking? Authorizing Provider  ALPRAZolam Prudy Feeler) 1 MG tablet TAKE 1 TABLET(1 MG) BY MOUTH DAILY AS NEEDED FOR  ANXIETY 07/19/22  Yes Glori Luis, MD  B Complex Vitamins (VITAMIN-B COMPLEX) TABS Take 1 tablet by mouth daily.   Yes [provider]  clobetasol cream (TEMOVATE) 0.05 % Apply 1 Application topically 2 (two) times daily. Prn right lower leg and arms prn x up to 2 weeks 11/09/21  Yes McLean-Scocuzza, Pasty Spillers, MD  Continuous Blood Gluc Receiver (FREESTYLE LIBRE 14 DAY READER) DEVI Use to check sugar 4 times daily 11/18/19  Yes Glori Luis, MD  Continuous Blood Gluc Sensor (FREESTYLE LIBRE 14 DAY SENSOR) MISC Apply once every 14 days. 11/18/19  Yes Glori Luis, MD  Crisaborole (EUCRISA) 2 % OINT Apply 1 application topically in the morning and at bedtime. Apply to affected areas of hands Patient taking differently: Apply 1 application  topically 2 (two) times daily as needed. Apply to affected areas of hands 04/22/20  Yes Moye, [place], MD  cyanocobalamin (,VITAMIN B-12,) 1000 MCG/ML injection Inject 1,000 mcg into the muscle every 30 (thirty) days. 05/19/21  Yes [provider]  estradiol (ESTRACE) 0.1 MG/GM vaginal cream Place 0.5 g vaginally 2 (two) times a week. Place 0.5g nightly for two weeks then twice a week after 12/08/21  Yes Marguerita Beards, MD  etanercept (ENBREL SURECLICK) 50 MG/ML injection Inject 50 mLs into the skin once a week. On friday 03/22/20  Yes [provider]  fexofenadine (ALLEGRA) 180 MG tablet TAKE ONE TABLET BY MOUTH DAILY 02/22/22  Yes Glori Luis, MD  folic acid (FOLVITE) 1 MG tablet TAKE 1 TABLET BY MOUTH DAILY 05/19/22  Yes Glori Luis, MD  hydrocortisone (ANUSOL-HC) 2.5 % rectal cream Place 1 Application rectally 2 (two) times daily. 06/02/22  Yes Kasandra Fehr, Elita Quick, MD  hydrocortisone 2.5 % cream Apply to affected areas twice daily as needed for up to 1 week. Can repeat in a month if needed 06/07/20  Yes Moye, [place], MD  ipratropium (ATROVENT) 0.06 % nasal spray Place 2 sprays into both nostrils 4 (four) times  daily. Patient taking differently: Place 2 sprays into both nostrils 4 (four) times daily as needed. 03/25/22  Yes Becky Augusta, NP  ketoconazole (NIZORAL) 2 % cream Apply to affected area twice daily as needed 06/07/20  Yes Moye, [place], MD  levonorgestrel (LILETTA) 18.6 MCG/DAY IUD IUD by Intrauterine route.   Yes [provider]  lidocaine (XYLOCAINE) 5 % ointment Apply 1 Application topically 4 (four) times daily as needed. 06/07/22  Yes Meadow Abramo, MD  montelukast (SINGULAIR) 10 MG tablet TAKE ONE TABLET BY MOUTH EVERY NIGHT AT BEDTIME 08/13/20  Yes Glori Luis, MD  Multiple Vitamin (MULTI-VITAMINS) TABS Take 1 tablet by mouth daily.   Yes [provider]  nystatin-triamcinolone ointment (MYCOLOG) Apply 1 application topically 2 (two) times daily. Patient taking differently: Apply 1 application  topically 2 (two) times daily as needed. 07/17/17  Yes Glori Luis, MD  ondansetron (ZOFRAN-ODT) 4 MG disintegrating tablet 4 mg every 8 (eight) hours as needed. 04/27/16  Yes [provider]  pregabalin (LYRICA) 50 MG capsule TAKE 1 CAPSULE BY MOUTH TWICE A DAY 01/02/22  Yes Glori Luis, MD  promethazine (PHENERGAN) 25 MG tablet Take 1 tablet (25 mg total) by mouth every 8 (eight) hours as needed for nausea or vomiting. 04/29/21  Yes McLean-Scocuzza, Pasty Spillers, MD  SUMAtriptan (IMITREX) 50 MG tablet TAKE 1 TABLET BY MOUTH AT ONSET OF HEADACHE; MAY REPEAT 1 TABLET IN 2 HOURS IF HEADACHE PERSISTS OR RECURS. NO MORE THAN 2 TABLETS IN A 24 HOUR PERIOD. 08/01/22  Yes Glori Luis, MD  traZODone (DESYREL) 50 MG tablet Take 0.5-1 tablets (25-50 mg total) by mouth at bedtime as needed for sleep. 03/02/22  Yes Glori Luis, MD  venlafaxine XR (EFFEXOR-XR) 150 MG 24 hr capsule TAKE ONE CAPSULE BY MOUTH DAILY WITH BREAKFAST 05/11/22  Yes Glori Luis, MD  buPROPion [facility]) 100 MG tablet TAKE ONE TABLET BY MOUTH THREE TIMES A DAY 10/03/19 02/23/20  Glori Luis, MD    Allergies: Allergies  Allergen Reactions   Penicillins Rash and Swelling   Oxycodone-Acetaminophen Rash   Tramadol Anxiety and Itching   Codeine Other (See Comments)    Chest pains   Tape Hives    Surgical tape    Review of Systems: Review of Systems  Constitutional:  Negative for chills and fever.  Respiratory:  Negative for shortness of breath.   Cardiovascular:  Negative for chest pain.  Gastrointestinal:  Negative for abdominal pain, blood in stool, nausea and vomiting.    Physical Exam BP 120/76   Pulse 63   Temp 98.1 F (36.7 C) (Oral)   Ht 5\' 7"  (1.702 m)   Wt 261 lb 6.4 oz (118.6 kg)   BMI 40.94 kg/m  CONSTITUTIONAL: No acute distress HEENT:  Normocephalic, atraumatic, extraocular motion intact. RESPIRATORY:  Normal respiratory effort  without pathologic use of accessory muscles. CARDIOVASCULAR: Regular rhythm and rate RECTAL: External exam reveals enlarged but not inflamed external hemorrhoids of all 3 columns.  There is some residual stool content in between the folds of the hemorrhoids as described by the patient.  Digital rectal exam reveals no significantly enlarged internal hemorrhoids, normal rectal tone with only minimal discomfort.  No further residual anal fissure is noted.  This has healed well. NEUROLOGIC:  Motor and sensation is grossly normal.  Cranial nerves are grossly intact. PSYCH:  Alert and oriented to person, place and time. Affect is normal.   Assessment and Plan: This is a 47 y.o. female status post exam under anesthesia with chemical sphincterotomy with Botox, now presenting for her external hemorrhoids.  - Discussed with patient that from the anal fissure standpoint, this has healed very well without any evidence of any residual fissure at this moment.  I think the main issue that the patient is having is truly related to the external hemorrhoids being enlarged causing pruritus ani due to residual stool material that is unable  to get cleaned well.  The patient wishes to have these areas excised to prevent further issues.  I think is very reasonable given what is happening at the moment. - Discussed with the patient then the plan for another exam under anesthesia this time with doing hemorrhoidectomy particularly external tissues.  Reviewed the surgery at length with her including the risks of bleeding, infection, injury to surrounding structures, that this would be an outpatient procedure, postoperative activity restrictions, pain control, and she is willing to proceed. - Patient would like to wait until early August to schedule her surgery.  Tentatively we may be able to schedule her for 09/28/2022 but the patient will call us back to finalize date for her surgery.  All of her questions have been answered.  I spent 30 minutes dedicated to the care of this patient on the date of this encounter to include pre-visit review of records, face-to-face time with the patient discussing diagnosis and management, and any post-visit coordination of care.   Howie Ill, MD [facility]

## 2022-08-16 ENCOUNTER — Telehealth: Payer: Self-pay | Admitting: Surgery

## 2022-08-16 NOTE — Telephone Encounter (Signed)
Outgoing call, per patient still awaiting with her work schedule. Will call when ready for surgery scheduling.

## 2022-08-22 ENCOUNTER — Ambulatory Visit: Payer: BC Managed Care – PPO | Admitting: Cardiovascular Disease

## 2022-09-27 ENCOUNTER — Encounter (INDEPENDENT_AMBULATORY_CARE_PROVIDER_SITE_OTHER): Payer: Self-pay

## 2022-10-02 ENCOUNTER — Ambulatory Visit: Payer: BC Managed Care – PPO | Admitting: Family Medicine

## 2022-10-11 ENCOUNTER — Ambulatory Visit: Payer: BC Managed Care – PPO | Admitting: Family Medicine

## 2022-10-16 ENCOUNTER — Telehealth: Payer: Self-pay | Admitting: Surgery

## 2022-10-16 NOTE — Telephone Encounter (Signed)
To date, patient has not called back to schedule surgery.  Patient last saw Dr. Aleen Campi in office on 08/14/22.  If calls back, will need another follow up in office with Dr. Aleen Campi before scheduling surgery.

## 2022-10-18 ENCOUNTER — Telehealth: Payer: BC Managed Care – PPO | Admitting: Emergency Medicine

## 2022-10-18 DIAGNOSIS — T63441A Toxic effect of venom of bees, accidental (unintentional), initial encounter: Secondary | ICD-10-CM

## 2022-10-18 NOTE — Patient Instructions (Addendum)
Jill Hawkins, thank you for joining Jill Parsons, NP for today's virtual visit.  While this provider is not your primary care provider (PCP), if your PCP is located in our provider database this encounter information will be shared with them immediately following your visit.   A [facility] account gives you access to today's visit and all your visits, tests, and labs performed at [facility] " click here if you don't have a [facility] account or go to [email or web address]  Consent: (Patient) Jill Hawkins provided verbal consent for this virtual visit at the beginning of the encounter.  Current Medications:  Current Outpatient Medications:    ALPRAZolam (XANAX) 1 MG tablet, TAKE 1 TABLET(1 MG) BY MOUTH DAILY AS NEEDED FOR ANXIETY, Disp: 15 tablet, Rfl: 0   B Complex Vitamins (VITAMIN-B COMPLEX) TABS, Take 1 tablet by mouth daily., Disp: , Rfl:    clobetasol cream (TEMOVATE) 0.05 %, Apply 1 Application topically 2 (two) times daily. Prn right lower leg and arms prn x up to 2 weeks, Disp: 60 g, Rfl: 1   Continuous Blood Gluc Receiver (FREESTYLE LIBRE 14 DAY READER) DEVI, Use to check sugar 4 times daily, Disp: 1 each, Rfl: 0   Continuous Blood Gluc Sensor (FREESTYLE LIBRE 14 DAY SENSOR) MISC, Apply once every 14 days., Disp: 6 each, Rfl: 1   Crisaborole (EUCRISA) 2 % OINT, Apply 1 application topically in the morning and at bedtime. Apply to affected areas of hands (Patient taking differently: Apply 1 application  topically 2 (two) times daily as needed. Apply to affected areas of hands), Disp: 60 g, Rfl: 11   cyanocobalamin (,VITAMIN B-12,) 1000 MCG/ML injection, Inject 1,000 mcg into the muscle every 30 (thirty) days., Disp: , Rfl:    estradiol (ESTRACE) 0.1 MG/GM vaginal cream, Place 0.5 g vaginally 2 (two) times a week. Place 0.5g nightly for two weeks then twice a week after, Disp: 30 g, Rfl: 11   etanercept (ENBREL SURECLICK) 50 MG/ML injection,  Inject 50 mLs into the skin once a week. On friday, Disp: , Rfl:    fexofenadine (ALLEGRA) 180 MG tablet, TAKE ONE TABLET BY MOUTH DAILY, Disp: 90 tablet, Rfl: 1   folic acid (FOLVITE) 1 MG tablet, TAKE 1 TABLET BY MOUTH DAILY, Disp: 90 tablet, Rfl: 0   hydrocortisone (ANUSOL-HC) 2.5 % rectal cream, Place 1 Application rectally 2 (two) times daily., Disp: 30 g, Rfl: 0   hydrocortisone 2.5 % cream, Apply to affected areas twice daily as needed for up to 1 week. Can repeat in a month if needed, Disp: 28 g, Rfl: 3   ipratropium (ATROVENT) 0.06 % nasal spray, Place 2 sprays into both nostrils 4 (four) times daily. (Patient taking differently: Place 2 sprays into both nostrils 4 (four) times daily as needed.), Disp: 15 mL, Rfl: 12   ketoconazole (NIZORAL) 2 % cream, Apply to affected area twice daily as needed, Disp: 30 g, Rfl: 7   levonorgestrel (LILETTA) 18.6 MCG/DAY IUD IUD, by Intrauterine route., Disp: , Rfl:    lidocaine (XYLOCAINE) 5 % ointment, Apply 1 Application topically 4 (four) times daily as needed., Disp: 35 g, Rfl: 0   montelukast (SINGULAIR) 10 MG tablet, TAKE ONE TABLET BY MOUTH EVERY NIGHT AT BEDTIME, Disp: 30 tablet, Rfl: 2   Multiple Vitamin (MULTI-VITAMINS) TABS, Take 1 tablet by mouth daily., Disp: , Rfl:    nystatin-triamcinolone ointment (MYCOLOG), Apply 1 application topically 2 (two) times daily. (Patient taking differently: Apply 1 application  topically 2 (two) times daily as needed.), Disp: 30 g, Rfl: 0   ondansetron (ZOFRAN-ODT) 4 MG disintegrating tablet, 4 mg every 8 (eight) hours as needed., Disp: , Rfl:    pregabalin (LYRICA) 50 MG capsule, TAKE 1 CAPSULE BY MOUTH TWICE A DAY, Disp: 60 capsule, Rfl: 2   promethazine (PHENERGAN) 25 MG tablet, Take 1 tablet (25 mg total) by mouth every 8 (eight) hours as needed for nausea or vomiting., Disp: 40 tablet, Rfl: 0   SUMAtriptan (IMITREX) 50 MG tablet, TAKE 1 TABLET BY MOUTH AT ONSET OF HEADACHE; MAY REPEAT 1 TABLET IN 2 HOURS IF  HEADACHE PERSISTS OR RECURS. NO MORE THAN 2 TABLETS IN A 24 HOUR PERIOD., Disp: 9 tablet, Rfl: 0   traZODone (DESYREL) 50 MG tablet, Take 0.5-1 tablets (25-50 mg total) by mouth at bedtime as needed for sleep., Disp: 30 tablet, Rfl: 3   venlafaxine XR (EFFEXOR-XR) 150 MG 24 hr capsule, TAKE ONE CAPSULE BY MOUTH DAILY WITH BREAKFAST, Disp: 90 capsule, Rfl: 3   Medications ordered in this encounter:  No orders of the defined types were placed in this encounter.    *If you need refills on other medications prior to your next appointment, please contact your pharmacy*  Follow-Up: Call back or seek an in-person evaluation if the symptoms worsen or if the condition fails to improve as anticipated.  [facility] [phone]  Other Instructions Use Zyrtec or Claritin (generic versions ok to use) instead of benadryl since benadryl is so sedating.   Use tylenol for pain per package instructions. Ice packs to the sting areas can also help.   Keep an eye on your blood pressure at home. It could be high as part of the sting reaction. As your body recovers from the stings, it should come back down to normal.   Keep an eye on your sting sites and monitor for signs of infection including redness, worsening pain, worsening swelling, warmth, or draining pus and if any of those occur, get seen again as you might need antibiotics for a skin infection.    If you have been instructed to have an in-person evaluation today at a local Urgent Care facility, please use the link below. It will take you to a list of all of our available [facility], including address, phone number and hours of operation. Please do not delay care.  [facility]  If you or a family member do not have a primary care provider, use the link below to schedule a visit and establish care. When you choose a [facility] primary care physician or advanced practice provider, you gain a long-term partner in  health. Find a Primary Care Provider  Learn more about [place] [facility]'s in-office and virtual care options: [place] - Get Care Now

## 2022-10-18 NOTE — Progress Notes (Signed)
Virtual Visit Consent   Jill Hawkins, you are scheduled for a virtual visit with a [facility] provider today. Just as with appointments in the office, your consent must be obtained to participate. Your consent will be active for this visit and any virtual visit you may have with one of our providers in the next 365 days. If you have a MyChart account, a copy of this consent can be sent to you electronically.  As this is a virtual visit, video technology does not allow for your provider to perform a traditional examination. This may limit your provider's ability to fully assess your condition. If your provider identifies any concerns that need to be evaluated in person or the need to arrange testing (such as labs, EKG, etc.), we will make arrangements to do so. Although advances in technology are sophisticated, we cannot ensure that it will always work on either your end or our end. If the connection with a video visit is poor, the visit may have to be switched to a telephone visit. With either a video or telephone visit, we are not always able to ensure that we have a secure connection.  By engaging in this virtual visit, you consent to the provision of healthcare and authorize for your insurance to be billed (if applicable) for the services provided during this visit. Depending on your insurance coverage, you may receive a charge related to this service.  I need to obtain your verbal consent now. Are you willing to proceed with your visit today? Jill Hawkins has provided verbal consent on 10/18/2022 for a virtual visit (video or telephone). Jill Parsons, NP  Date: 10/18/2022 4:35 PM  Virtual Visit via Video Note   I, Jill Hawkins, [name], 10/28/1975) on 10/18/22 at  4:15 PM EDT by a video-enabled telemedicine application and verified that I am speaking with the correct person using two identifiers.  Location: Patient: Virtual Visit Location Patient:  Other: parked car in [place] Provider: Virtual Visit Location Provider: Home Office   I discussed the limitations of evaluation and management by telemedicine and the availability of in person appointments. The patient expressed understanding and agreed to proceed.    History of Present Illness: Jill Hawkins is a 47 y.o. who identifies as a female who was assigned female at birth, and is being seen today for yellow jacket stings x4 yesterday. Stung twice in head, once on hand and once on ankle. Immediately took benadryl. Felt dry mouth and slight dry cough. Spoke with nurse line at PCP's office about stings/sx and instructed to go to ED. Spent 2 hours in ED wihtout being seen last night, decided to go home. No benadryl today, still has dry mouth and slight dry cough. Denies SOB, wheezing, angioedema. Sting areas are painful still and a little itchy. No hx of anaphylaxis. Today had an iron infusion in [place] - BP prior to infusion was 126/94 and after was 146/95. Pt does not have hx HTN, BP usually normal. Wants to know if sting could cause elevated BP.     HPI: HPI  Problems:  Patient Active Problem List   Diagnosis Date Noted   Anal fissure 06/07/2022   Rectal fissure 06/01/2022   Skin tag 03/02/2022   Sleeping difficulty 03/02/2022   Sensation of feeling hot 01/30/2022   Muscle cramps 09/05/2021   Cholelithiasis without obstruction 05/03/2021   Dysuria 05/03/2021   Gastrojejunal ulcer 05/03/2021   Iron  deficiency 05/03/2021   Enteritis 05/03/2021   Toe pain 05/03/2021   Migraine 05/03/2021   Carpal tunnel syndrome 05/03/2021   Overactive bladder 02/02/2021   IDA (iron deficiency anemia) 08/23/2020   Palpitations 08/23/2020   Ingrown hair 08/23/2020   Bright red blood per rectum 05/10/2020   Lateral epicondylitis of right elbow 05/10/2020   Bilateral leg edema 05/10/2020   Labial lesion 03/10/2020   Breast pain, right 03/10/2020   COVID-19 02/24/2020   Positive anti-CCP  test 07/10/2019   Right hip pain 06/05/2019   Decreased sex drive 78/29/5621   Osteopenia 06/05/2019   Obesity (BMI 35.0-39.9 without comorbidity) 06/05/2019   GERD (gastroesophageal reflux disease) 11/30/2018   Skin lesion 11/30/2018   Wrist nodule 11/30/2018   Breast cancer screening 11/30/2018   Muscle strain of right scapular region 10/09/2018   Chronic use of opiate drug for therapeutic purpose 12/03/2017   Neck pain 12/03/2017   Hypoglycemic disorder 07/17/2017   Anxiety and depression 05/17/2017   Left leg swelling 11/17/2016   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   Allergic rhinitis 06/20/2016   Right shoulder pain 06/20/2016   Candidal intertrigo 06/20/2016   Hemorrhoids 06/20/2016   Soft tissue mass 06/20/2016   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   Nausea 04/18/2016   Left lower quadrant pain 11/18/2015   Encounter for general adult medical examination with abnormal findings 06/27/2015   Cyst of right ovary 04/14/2015   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   Back pain 06/22/2014   Right cervical radiculopathy 04/25/2014   Left knee pain 04/25/2014   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/25/2014   Pain in joint involving lower leg 04/25/2014   Morbid (severe) obesity due to excess calories (HCC) 11/11/2012    Allergies:  Allergies  Allergen Reactions   Penicillins Rash and Swelling   Oxycodone-Acetaminophen Rash   Tramadol Anxiety and Itching   Codeine Other (See Comments)    Chest pains   Tape Hives    Surgical tape   Medications:  Current Outpatient Medications:    ALPRAZolam (XANAX) 1 MG tablet, TAKE 1 TABLET(1 MG) BY MOUTH DAILY AS NEEDED FOR ANXIETY, Disp: 15 tablet, Rfl: 0   B Complex Vitamins (VITAMIN-B COMPLEX) TABS, Take 1 tablet by mouth daily., Disp: , Rfl:    clobetasol cream (TEMOVATE) 0.05 %, Apply 1 Application topically 2 (two) times daily. Prn right lower leg and arms prn x up to 2 weeks, Disp: 60 g, Rfl: 1   Continuous Blood Gluc  Receiver (FREESTYLE LIBRE 14 DAY READER) DEVI, Use to check sugar 4 times daily, Disp: 1 each, Rfl: 0   Continuous Blood Gluc Sensor (FREESTYLE LIBRE 14 DAY SENSOR) MISC, Apply once every 14 days., Disp: 6 each, Rfl: 1   Crisaborole (EUCRISA) 2 % OINT, Apply 1 application topically in the morning and at bedtime. Apply to affected areas of hands (Patient taking differently: Apply 1 application  topically 2 (two) times daily as needed. Apply to affected areas of hands), Disp: 60 g, Rfl: 11   cyanocobalamin (,VITAMIN B-12,) 1000 MCG/ML injection, Inject 1,000 mcg into the muscle every 30 (thirty) days., Disp: , Rfl:    estradiol (ESTRACE) 0.1 MG/GM vaginal cream, Place 0.5 g vaginally 2 (two) times a week. Place 0.5g nightly for two weeks then twice a week after, Disp: 30 g, Rfl: 11   etanercept (ENBREL SURECLICK) 50 MG/ML injection, Inject 50 mLs into the skin once a week. On friday, Disp: , Rfl:    fexofenadine (ALLEGRA) 180 MG  tablet, TAKE ONE TABLET BY MOUTH DAILY, Disp: 90 tablet, Rfl: 1   folic acid (FOLVITE) 1 MG tablet, TAKE 1 TABLET BY MOUTH DAILY, Disp: 90 tablet, Rfl: 0   hydrocortisone (ANUSOL-HC) 2.5 % rectal cream, Place 1 Application rectally 2 (two) times daily., Disp: 30 g, Rfl: 0   hydrocortisone 2.5 % cream, Apply to affected areas twice daily as needed for up to 1 week. Can repeat in a month if needed, Disp: 28 g, Rfl: 3   ipratropium (ATROVENT) 0.06 % nasal spray, Place 2 sprays into both nostrils 4 (four) times daily. (Patient taking differently: Place 2 sprays into both nostrils 4 (four) times daily as needed.), Disp: 15 mL, Rfl: 12   ketoconazole (NIZORAL) 2 % cream, Apply to affected area twice daily as needed, Disp: 30 g, Rfl: 7   levonorgestrel (LILETTA) 18.6 MCG/DAY IUD IUD, by Intrauterine route., Disp: , Rfl:    lidocaine (XYLOCAINE) 5 % ointment, Apply 1 Application topically 4 (four) times daily as needed., Disp: 35 g, Rfl: 0   montelukast (SINGULAIR) 10 MG tablet, TAKE ONE  TABLET BY MOUTH EVERY NIGHT AT BEDTIME, Disp: 30 tablet, Rfl: 2   Multiple Vitamin (MULTI-VITAMINS) TABS, Take 1 tablet by mouth daily., Disp: , Rfl:    nystatin-triamcinolone ointment (MYCOLOG), Apply 1 application topically 2 (two) times daily. (Patient taking differently: Apply 1 application  topically 2 (two) times daily as needed.), Disp: 30 g, Rfl: 0   ondansetron (ZOFRAN-ODT) 4 MG disintegrating tablet, 4 mg every 8 (eight) hours as needed., Disp: , Rfl:    pregabalin (LYRICA) 50 MG capsule, TAKE 1 CAPSULE BY MOUTH TWICE A DAY, Disp: 60 capsule, Rfl: 2   promethazine (PHENERGAN) 25 MG tablet, Take 1 tablet (25 mg total) by mouth every 8 (eight) hours as needed for nausea or vomiting., Disp: 40 tablet, Rfl: 0   SUMAtriptan (IMITREX) 50 MG tablet, TAKE 1 TABLET BY MOUTH AT ONSET OF HEADACHE; MAY REPEAT 1 TABLET IN 2 HOURS IF HEADACHE PERSISTS OR RECURS. NO MORE THAN 2 TABLETS IN A 24 HOUR PERIOD., Disp: 9 tablet, Rfl: 0   traZODone (DESYREL) 50 MG tablet, Take 0.5-1 tablets (25-50 mg total) by mouth at bedtime as needed for sleep., Disp: 30 tablet, Rfl: 3   venlafaxine XR (EFFEXOR-XR) 150 MG 24 hr capsule, TAKE ONE CAPSULE BY MOUTH DAILY WITH BREAKFAST, Disp: 90 capsule, Rfl: 3  Observations/Objective: Patient is well-developed, well-nourished in no acute distress.  Resting comfortably in parked car  Head is normocephalic, atraumatic.  No labored breathing. No angioedema of lips. Talks in complete sentences with ease. Smiles and laughs occasionally Speech is clear and coherent with logical content.  Patient is alert and oriented at baseline.  Pt showed me hand sting area by video - does appear swollen slightly, does not appear red, no drainage  Assessment and Plan: 1. Bee sting, accidental or unintentional, initial encounter  Discussed supportive care. No anaphylaxis this sting episode but discussed what it could look like in the future if she were stung and did have a severe reaction.  Given she has RA on enbrel, discussed possibiiltiy of infection at sting sites.   Follow Up Instructions: I discussed the assessment and treatment plan with the patient. The patient was provided an opportunity to ask questions and all were answered. The patient agreed with the plan and demonstrated an understanding of the instructions.  A copy of instructions were sent to the patient via MyChart unless otherwise noted below.   The  patient was advised to call back or seek an in-person evaluation if the symptoms worsen or if the condition fails to improve as anticipated.  Time:  I spent 15 minutes with the patient via telehealth technology discussing the above problems/concerns.    Jill Parsons, NP

## 2022-10-20 ENCOUNTER — Telehealth: Payer: Self-pay

## 2022-10-20 NOTE — Transitions of Care (Post Inpatient/ED Visit) (Unsigned)
I spoke with pt;10/17/22 pt stung x 4 by yellow jackets;took Benadryl before going [facility] ED. pt waited 2 hrs at ED and left. pt had no swelling in neck,throat or tongue and no difficulty breathing.10/18/22 pt did [facility] telehealth visit and today no issues. If pt has concerns will call [facility] and UC & ED precautions given. Sending note to Marikay Alar MD.      10/20/2022  Name: Jill Hawkins MRN: [id number] DOB: February 05, 1976  Today's TOC FU Call Status: Today's TOC FU Call Status:: Successful TOC FU Call Completed TOC FU Call Complete Date: 10/20/22 Patient's Name and Date of Birth confirmed.  Transition Care Management Follow-up Telephone Call Date of Discharge: 10/17/22 Discharge Facility: Other (Non-[facility] Facility) Name of Other ([facility]) Discharge Facility: [facility] ED Type of Discharge: Emergency Department Reason for ED Visit: Other: (10/17/22 pt stung x 4 by yellow jackets;took Benadryl before going [facility] ED. pt waited 2 hrs at ED and left. pt had no swelling in neck,throat or tongue and no difficulty breathing.10/18/22 pt did [facility] telehealth visit and today no issues.) How have you been since you were released from the hospital?: Better Any questions or concerns?: No  Items Reviewed: Did you receive and understand the discharge instructions provided?: No (left AMA) Medications obtained,verified, and reconciled?: No Medications Not Reviewed Reasons::  (pt was not seen at ED on 10/17/22 and no problems today.) Any new allergies since your discharge?: No Dietary orders reviewed?: NA Do you have support at home?: Yes People in Home: spouse Name of Support/Comfort Primary Source: Chrissie Noa  Medications Reviewed Today: Medications Reviewed Today   Medications were not reviewed in this encounter     Home Care and Equipment/Supplies: Were Home Health Services Ordered?: NA Any new equipment or medical supplies ordered?: NA  Functional  Questionnaire: Do you need assistance with bathing/showering or dressing?: No Do you need assistance with meal preparation?: No Do you need assistance with eating?: No Do you have difficulty maintaining continence: No Do you need assistance with getting out of bed/getting out of a chair/moving?: No Do you have difficulty managing or taking your medications?: No  Follow up appointments reviewed: PCP Follow-up appointment confirmed?: NA Specialist Hospital Follow-up appointment confirmed?: Yes Date of Specialist follow-up appointment?: 10/18/22 Follow-Up Specialty Provider:: pt did [facility] telehealth visit Do you need transportation to your follow-up appointment?: No Do you understand care options if your condition(s) worsen?: Yes-patient verbalized understanding    SIGNATURE Lewanda Rife, LPN

## 2022-10-20 NOTE — Telephone Encounter (Signed)
Noted  

## 2022-10-23 ENCOUNTER — Other Ambulatory Visit (HOSPITAL_COMMUNITY)
Admission: RE | Admit: 2022-10-23 | Discharge: 2022-10-23 | Disposition: A | Discharge status: Home / Self Care | Payer: BC Managed Care – PPO | Source: Ambulatory Visit | Attending: Obstetrics & Gynecology | Admitting: Obstetrics & Gynecology

## 2022-10-23 ENCOUNTER — Encounter (HOSPITAL_BASED_OUTPATIENT_CLINIC_OR_DEPARTMENT_OTHER): Payer: Self-pay | Admitting: Obstetrics & Gynecology

## 2022-10-23 ENCOUNTER — Ambulatory Visit (INDEPENDENT_AMBULATORY_CARE_PROVIDER_SITE_OTHER): Payer: BC Managed Care – PPO | Admitting: Obstetrics & Gynecology

## 2022-10-23 VITALS — BP 120/72 | HR 62 | Ht 67.0 in | Wt 266.0 lb

## 2022-10-23 DIAGNOSIS — Z862 Personal history of diseases of the blood and blood-forming organs and certain disorders involving the immune mechanism: Secondary | ICD-10-CM

## 2022-10-23 DIAGNOSIS — Z01419 Encounter for gynecological examination (general) (routine) without abnormal findings: Secondary | ICD-10-CM | POA: Diagnosis not present

## 2022-10-23 DIAGNOSIS — Z124 Encounter for screening for malignant neoplasm of cervix: Secondary | ICD-10-CM | POA: Diagnosis present

## 2022-10-23 DIAGNOSIS — R6882 Decreased libido: Secondary | ICD-10-CM

## 2022-10-23 DIAGNOSIS — T8332XA Displacement of intrauterine contraceptive device, initial encounter: Secondary | ICD-10-CM

## 2022-10-23 NOTE — Progress Notes (Addendum)
47 y.o. G15P2002 Married White or Caucasian female here for annual exam/new patient exam.  One issue is low sex drive.    Had a Liletta IUD.  Mirena was placed but was malpositioned and was removed.  Had this placed 08/2018.  Typically does not have bleeding but did have a cycle this past month.  It was not heavy.  Placement was really difficult and she is not sure she wants another one.    No LMP recorded. (Menstrual status: IUD).          Sexually active: Yes.    The current method of family planning is IUD.    Smoker:  no  Health Maintenance: Pap:  11/11/2019 Negative History of abnormal Pap:  no MMG: 10/06/2022 Negative Colonoscopy:  04/18/2021 Screening Labs: does with Dr. Birdie Sons   reports that she has quit smoking. Her smoking use included cigarettes. She has never used smokeless tobacco. She reports that she does not drink alcohol and does not use drugs.  Past Medical History:  Diagnosis Date   Anemia    hx iron infusions   Anxiety    Chronic pain    r/t ddd   Depression    Frequent headaches    GERD (gastroesophageal reflux disease)    History of fainting spells of unknown cause    History of hiatal hernia    History of kidney stones    Hypertension    no issues since gastric bypass   Migraine    PONV (postoperative nausea and vomiting)    Rheumatoid arthritis (HCC)    UTI (lower urinary tract infection)     Past Surgical History:  Procedure Laterality Date   BOTOX INJECTION N/A 06/07/2022   Procedure: BOTOX INJECTION;  Surgeon: Henrene Dodge, MD;  Location: [facility] ORS;  Service: General;  Laterality: N/A;   CARPAL TUNNEL RELEASE  02/28/2003   CESAREAN SECTION     x 2   CHOLECYSTECTOMY  02/27/2013   GASTRIC BYPASS  04/14/2013   GROWTH PLATE SURGERY  81/19/1478   Right hip   HIATAL HERNIA REPAIR     SPHINCTEROTOMY N/A 06/07/2022   Procedure: SPHINCTEROTOMY, botox chemical;  Surgeon: Henrene Dodge, MD;  Location: [facility] ORS;  Service: General;  Laterality: N/A;     Current Outpatient Medications  Medication Sig Dispense Refill   ALPRAZolam (XANAX) 1 MG tablet TAKE 1 TABLET(1 MG) BY MOUTH DAILY AS NEEDED FOR ANXIETY 15 tablet 0   B Complex Vitamins (VITAMIN-B COMPLEX) TABS Take 1 tablet by mouth daily.     clobetasol cream (TEMOVATE) 0.05 % Apply 1 Application topically 2 (two) times daily. Prn right lower leg and arms prn x up to 2 weeks 60 g 1   Continuous Blood Gluc Receiver (FREESTYLE LIBRE 14 DAY READER) DEVI Use to check sugar 4 times daily 1 each 0   Continuous Blood Gluc Sensor (FREESTYLE LIBRE 14 DAY SENSOR) MISC Apply once every 14 days. 6 each 1   Crisaborole (EUCRISA) 2 % OINT Apply 1 application topically in the morning and at bedtime. Apply to affected areas of hands (Patient taking differently: Apply 1 application  topically 2 (two) times daily as needed. Apply to affected areas of hands) 60 g 11   cyanocobalamin (,VITAMIN B-12,) 1000 MCG/ML injection Inject 1,000 mcg into the muscle every 30 (thirty) days.     estradiol (ESTRACE) 0.1 MG/GM vaginal cream Place 0.5 g vaginally 2 (two) times a week. Place 0.5g nightly for two weeks then twice a week after  30 g 11   etanercept (ENBREL SURECLICK) 50 MG/ML injection Inject 50 mLs into the skin once a week. On friday     fexofenadine (ALLEGRA) 180 MG tablet TAKE ONE TABLET BY MOUTH DAILY 90 tablet 1   folic acid (FOLVITE) 1 MG tablet TAKE 1 TABLET BY MOUTH DAILY 90 tablet 0   hydrocortisone (ANUSOL-HC) 2.5 % rectal cream Place 1 Application rectally 2 (two) times daily. 30 g 0   hydrocortisone 2.5 % cream Apply to affected areas twice daily as needed for up to 1 week. Can repeat in a month if needed 28 g 3   ipratropium (ATROVENT) 0.06 % nasal spray Place 2 sprays into both nostrils 4 (four) times daily. (Patient taking differently: Place 2 sprays into both nostrils 4 (four) times daily as needed.) 15 mL 12   ketoconazole (NIZORAL) 2 % cream Apply to affected area twice daily as needed 30 g 7    levonorgestrel (LILETTA) 18.6 MCG/DAY IUD IUD by Intrauterine route.     lidocaine (XYLOCAINE) 5 % ointment Apply 1 Application topically 4 (four) times daily as needed. 35 g 0   montelukast (SINGULAIR) 10 MG tablet TAKE ONE TABLET BY MOUTH EVERY NIGHT AT BEDTIME 30 tablet 2   Multiple Vitamin (MULTI-VITAMINS) TABS Take 1 tablet by mouth daily.     nystatin-triamcinolone ointment (MYCOLOG) Apply 1 application topically 2 (two) times daily. (Patient taking differently: Apply 1 application  topically 2 (two) times daily as needed.) 30 g 0   ondansetron (ZOFRAN-ODT) 4 MG disintegrating tablet 4 mg every 8 (eight) hours as needed.     pregabalin (LYRICA) 50 MG capsule TAKE 1 CAPSULE BY MOUTH TWICE A DAY 60 capsule 2   promethazine (PHENERGAN) 25 MG tablet Take 1 tablet (25 mg total) by mouth every 8 (eight) hours as needed for nausea or vomiting. 40 tablet 0   SUMAtriptan (IMITREX) 50 MG tablet TAKE 1 TABLET BY MOUTH AT ONSET OF HEADACHE; MAY REPEAT 1 TABLET IN 2 HOURS IF HEADACHE PERSISTS OR RECURS. NO MORE THAN 2 TABLETS IN A 24 HOUR PERIOD. 9 tablet 0   traZODone (DESYREL) 50 MG tablet Take 0.5-1 tablets (25-50 mg total) by mouth at bedtime as needed for sleep. 30 tablet 3   venlafaxine XR (EFFEXOR-XR) 150 MG 24 hr capsule TAKE ONE CAPSULE BY MOUTH DAILY WITH BREAKFAST 90 capsule 3   No current facility-administered medications for this visit.    Family History  Problem Relation Age of Onset   Arthritis Mother    Hypertension Mother    Mental illness Mother    Diabetes Mother    Alcohol abuse Father    Cancer Father    Hypertension Father    Mental illness Brother    Heart disease Maternal Grandmother    Hypertension Maternal Grandmother    Mental illness Maternal Grandmother    Diabetes Maternal Grandmother    Cancer Maternal Grandfather    Hypertension Maternal Grandfather    Mental illness Paternal Grandmother     ROS: Constitutional: negative Genitourinary:negative  Exam:    BP (!) 143/80 (BP Location: Right Arm, Patient Position: Sitting, Cuff Size: Large)   Pulse 62   Ht 5\' 7"  (1.702 m) Comment: Reported  Wt 266 lb (120.7 kg)   BMI 41.66 kg/m   Height: 5\' 7"  (170.2 cm) (Reported)  General appearance: alert, cooperative and appears stated age Head: Normocephalic, without obvious abnormality, atraumatic Neck: no adenopathy, supple, symmetrical, trachea midline and thyroid normal to inspection and palpation  Lungs: clear to auscultation bilaterally Breasts: normal appearance, no masses or tenderness Heart: regular rate and rhythm Abdomen: soft, non-tender; bowel sounds normal; no masses,  no organomegaly Extremities: extremities normal, atraumatic, no cyanosis or edema Skin: Skin color, texture, turgor normal. No rashes or lesions Lymph nodes: Cervical, supraclavicular, and axillary nodes normal. No abnormal inguinal nodes palpated Neurologic: Grossly normal   Pelvic: External genitalia:  no lesions              Urethra:  normal appearing urethra with no masses, tenderness or lesions              Bartholins and Skenes: normal                 Vagina: normal appearing vagina with normal color and no discharge, no lesions              Cervix: no lesions              Pap taken: Yes.   Bimanual Exam:  Uterus:  normal size, contour, position, consistency, mobility, non-tender              Adnexa: normal adnexa and no mass, fullness, tenderness              Rectal exam not done due to evaluation for fissure done April- June this year  Chaperone, Ina Homes, CMA, was present for exam.  Assessment/Plan: 1. Well woman exam with routine gynecological exam - Pap smear and HR HPV obtained today - Mammogram 09/2022 - Colonoscopy done with GI in 2023 - lab work done with PCP, Dr. Lemont Fillers - vaccines reviewed/updated  2. Decreased libido - Follicle stimulating hormone - Testosterone, Total, LC/MS/MS  3. Cervical cancer screening - Cytology - PAP( [facility])  4. History of iron deficiency anemia  5. Intrauterine contraceptive device threads lost, initial encounter - IUD strings not seen today.  Given irregular bleeding hx with last cycle, feel u/s should be done.  Ordered at [facility] given location of pt's home and work. - US PELVIC COMPLETE WITH TRANSVAGINAL; Future

## 2022-10-25 ENCOUNTER — Ambulatory Visit: Payer: BC Managed Care – PPO | Admitting: Family Medicine

## 2022-10-25 LAB — CYTOLOGY - PAP
Comment: NEGATIVE
Diagnosis: NEGATIVE
High risk HPV: NEGATIVE

## 2022-10-26 ENCOUNTER — Ambulatory Visit: Payer: BC Managed Care – PPO | Admitting: Cardiovascular Disease

## 2022-10-27 ENCOUNTER — Ambulatory Visit
Admission: RE | Admit: 2022-10-27 | Discharge: 2022-10-27 | Disposition: A | Discharge status: Home / Self Care | Payer: BC Managed Care – PPO | Source: Ambulatory Visit | Attending: Obstetrics & Gynecology | Admitting: Obstetrics & Gynecology

## 2022-10-27 DIAGNOSIS — T8332XA Displacement of intrauterine contraceptive device, initial encounter: Secondary | ICD-10-CM | POA: Insufficient documentation

## 2022-10-29 LAB — FOLLICLE STIMULATING HORMONE: FSH: 30.5 m[IU]/mL

## 2022-10-29 LAB — TESTOSTERONE, TOTAL, LC/MS/MS: Testosterone, total: 10.6 ng/dL

## 2022-11-08 ENCOUNTER — Encounter (HOSPITAL_BASED_OUTPATIENT_CLINIC_OR_DEPARTMENT_OTHER): Payer: Self-pay | Admitting: Obstetrics & Gynecology

## 2022-11-17 ENCOUNTER — Encounter: Payer: Self-pay | Admitting: Family Medicine

## 2022-11-17 ENCOUNTER — Ambulatory Visit (INDEPENDENT_AMBULATORY_CARE_PROVIDER_SITE_OTHER): Payer: BC Managed Care – PPO | Admitting: Family Medicine

## 2022-11-17 VITALS — BP 116/80 | HR 85 | Temp 98.4°F | Ht 67.0 in | Wt 254.2 lb

## 2022-11-17 DIAGNOSIS — K602 Anal fissure, unspecified: Secondary | ICD-10-CM

## 2022-11-17 DIAGNOSIS — R0781 Pleurodynia: Secondary | ICD-10-CM | POA: Insufficient documentation

## 2022-11-17 DIAGNOSIS — E669 Obesity, unspecified: Secondary | ICD-10-CM

## 2022-11-17 DIAGNOSIS — R0789 Other chest pain: Secondary | ICD-10-CM | POA: Insufficient documentation

## 2022-11-17 MED ORDER — NIFEDIPINE 0.3 % OINTMENT
1.0000 | TOPICAL_OINTMENT | Freq: Two times a day (BID) | CUTANEOUS | 0 refills | Status: DC
Start: 2022-11-17 — End: 2023-03-26

## 2022-11-17 NOTE — Progress Notes (Signed)
Marikay Alar, MD Phone: [phone]  Jill Hawkins is a 47 y.o. female who presents today for f/u.  Obesity: Patient notes she is walking some for exercise.  She watches what she eats and tries to get some fruits and vegetables.  She is trying to get more fiber.  No soda.  Minimal sweets.  No fried foods.  She was unable to get [facility].  Back pain: Patient notes recently has had some back pain that is different than her chronic issues.  Started about a week ago.  She saw her rheumatologist and they gave her prednisone and Flexeril.  She has not started the prednisone yet.  She notes that the constant discomfort that is worse with bending over.  Heat eases it some.  Rectal fissure: Patient reports recently has had recurrence of pain in her rectum.  Feels like she is passing glass with a bowel movement.  Previously used topical nifedipine with little benefit and had to see a surgeon to have Botox injections.   Social History   Tobacco Use  Smoking Status Former   Types: Cigarettes  Smokeless Tobacco Never  Tobacco Comments   Smoker casually in her High School Years    Current Outpatient Medications on File Prior to Visit  Medication Sig Dispense Refill   ALPRAZolam (XANAX) 1 MG tablet TAKE 1 TABLET(1 MG) BY MOUTH DAILY AS NEEDED FOR ANXIETY 15 tablet 0   B Complex Vitamins (VITAMIN-B COMPLEX) TABS Take 1 tablet by mouth daily.     clobetasol cream (TEMOVATE) 0.05 % Apply 1 Application topically 2 (two) times daily. Prn right lower leg and arms prn x up to 2 weeks 60 g 1   Continuous Blood Gluc Receiver (FREESTYLE LIBRE 14 DAY READER) DEVI Use to check sugar 4 times daily 1 each 0   Continuous Blood Gluc Sensor (FREESTYLE LIBRE 14 DAY SENSOR) MISC Apply once every 14 days. 6 each 1   Crisaborole (EUCRISA) 2 % OINT Apply 1 application topically in the morning and at bedtime. Apply to affected areas of hands (Patient taking differently: Apply 1 application  topically 2 (two) times  daily as needed. Apply to affected areas of hands) 60 g 11   cyanocobalamin (,VITAMIN B-12,) 1000 MCG/ML injection Inject 1,000 mcg into the muscle every 30 (thirty) days.     estradiol (ESTRACE) 0.1 MG/GM vaginal cream Place 0.5 g vaginally 2 (two) times a week. Place 0.5g nightly for two weeks then twice a week after 30 g 11   etanercept (ENBREL SURECLICK) 50 MG/ML injection Inject 50 mLs into the skin once a week. On friday     fexofenadine (ALLEGRA) 180 MG tablet TAKE ONE TABLET BY MOUTH DAILY 90 tablet 1   folic acid (FOLVITE) 1 MG tablet TAKE 1 TABLET BY MOUTH DAILY 90 tablet 0   hydrocortisone (ANUSOL-HC) 2.5 % rectal cream Place 1 Application rectally 2 (two) times daily. 30 g 0   hydrocortisone 2.5 % cream Apply to affected areas twice daily as needed for up to 1 week. Can repeat in a month if needed 28 g 3   ipratropium (ATROVENT) 0.06 % nasal spray Place 2 sprays into both nostrils 4 (four) times daily. (Patient taking differently: Place 2 sprays into both nostrils 4 (four) times daily as needed.) 15 mL 12   ketoconazole (NIZORAL) 2 % cream Apply to affected area twice daily as needed 30 g 7   levonorgestrel (LILETTA) 18.6 MCG/DAY IUD IUD by Intrauterine route.     lidocaine (  XYLOCAINE) 5 % ointment Apply 1 Application topically 4 (four) times daily as needed. 35 g 0   montelukast (SINGULAIR) 10 MG tablet TAKE ONE TABLET BY MOUTH EVERY NIGHT AT BEDTIME 30 tablet 2   Multiple Vitamin (MULTI-VITAMINS) TABS Take 1 tablet by mouth daily.     nystatin-triamcinolone ointment (MYCOLOG) Apply 1 application topically 2 (two) times daily. (Patient taking differently: Apply 1 application  topically 2 (two) times daily as needed.) 30 g 0   ondansetron (ZOFRAN-ODT) 4 MG disintegrating tablet 4 mg every 8 (eight) hours as needed.     pregabalin (LYRICA) 50 MG capsule TAKE 1 CAPSULE BY MOUTH TWICE A DAY 60 capsule 2   promethazine (PHENERGAN) 25 MG tablet Take 1 tablet (25 mg total) by mouth every 8  (eight) hours as needed for nausea or vomiting. 40 tablet 0   SUMAtriptan (IMITREX) 50 MG tablet TAKE 1 TABLET BY MOUTH AT ONSET OF HEADACHE; MAY REPEAT 1 TABLET IN 2 HOURS IF HEADACHE PERSISTS OR RECURS. NO MORE THAN 2 TABLETS IN A 24 HOUR PERIOD. 9 tablet 0   traZODone (DESYREL) 50 MG tablet Take 0.5-1 tablets (25-50 mg total) by mouth at bedtime as needed for sleep. 30 tablet 3   venlafaxine XR (EFFEXOR-XR) 150 MG 24 hr capsule TAKE ONE CAPSULE BY MOUTH DAILY WITH BREAKFAST 90 capsule 3   [DISCONTINUED] buPROPion (WELLBUTRIN) 100 MG tablet TAKE ONE TABLET BY MOUTH THREE TIMES A DAY 90 tablet 0   No current facility-administered medications on file prior to visit.     ROS see history of present illness  Objective  Physical Exam Vitals:   11/17/22 1457  BP: 116/80  Pulse: 85  Temp: 98.4 F (36.9 C)  SpO2: 97%    BP Readings from Last 3 Encounters:  11/17/22 116/80  10/23/22 120/72  08/14/22 120/76   Wt Readings from Last 3 Encounters:  11/17/22 254 lb 3.2 oz (115.3 kg)  10/23/22 266 lb (120.7 kg)  08/14/22 261 lb 6.4 oz (118.6 kg)    Physical Exam Constitutional:      General: She is not in acute distress.    Appearance: She is not diaphoretic.  Cardiovascular:     Rate and Rhythm: Normal rate and regular rhythm.     Heart sounds: Normal heart sounds.  Pulmonary:     Effort: Pulmonary effort is normal.     Breath sounds: Normal breath sounds.  Genitourinary:    Comments: Prince Solian, CMA served as chaperone, skin tags noted around rectum, no internal exam was done given likely rectal fissure Musculoskeletal:       Back:     Comments: No midline spine tenderness, no midline spine step-off  Skin:    General: Skin is warm and dry.  Neurological:     Mental Status: She is alert.      Assessment/Plan: Please see individual problem list.  Rectal fissure Assessment & Plan: History is consistent with rectal fissure.  We will see if topical nifedipine  will be beneficial at this time.  If not improving she will contact her general surgeon to get into see them for management.  Orders: -     nifedipine 0.3 % ointment; Place 1 Application rectally in the morning and at bedtime. For 8 weeks  Dispense: 100 g; Refill: 0  Obesity (BMI 35.0-39.9 without comorbidity) Assessment & Plan: Chronic issue.  Encouraged patient to continue with exercise and monitoring her diet.   Rib pain Assessment & Plan: I think the patient  has more likely strained the muscles between her ribs to cause her back discomfort.  Discussed she could try the prednisone and continue with the Flexeril as prescribed by her rheumatologist to see if this would be beneficial.      Return in about 6 months (around 05/17/2023).   Marikay Alar, MD [facility]

## 2022-11-17 NOTE — Assessment & Plan Note (Signed)
I think the patient has more likely strained the muscles between her ribs to cause her back discomfort.  Discussed she could try the prednisone and continue with the Flexeril as prescribed by her rheumatologist to see if this would be beneficial.

## 2022-11-17 NOTE — Assessment & Plan Note (Signed)
Chronic issue.  Encouraged patient to continue with exercise and monitoring her diet.

## 2022-11-17 NOTE — Assessment & Plan Note (Signed)
History is consistent with rectal fissure.  We will see if topical nifedipine will be beneficial at this time.  If not improving she will contact her general surgeon to get into see them for management.

## 2022-12-04 ENCOUNTER — Other Ambulatory Visit: Payer: Self-pay | Admitting: Family Medicine

## 2022-12-04 DIAGNOSIS — G8929 Other chronic pain: Secondary | ICD-10-CM

## 2022-12-04 DIAGNOSIS — J309 Allergic rhinitis, unspecified: Secondary | ICD-10-CM

## 2022-12-04 DIAGNOSIS — R519 Headache, unspecified: Secondary | ICD-10-CM

## 2022-12-04 DIAGNOSIS — M5412 Radiculopathy, cervical region: Secondary | ICD-10-CM

## 2022-12-07 ENCOUNTER — Other Ambulatory Visit: Payer: Self-pay

## 2022-12-07 ENCOUNTER — Telehealth: Payer: Self-pay | Admitting: Family Medicine

## 2022-12-07 DIAGNOSIS — M5412 Radiculopathy, cervical region: Secondary | ICD-10-CM

## 2022-12-07 MED ORDER — PREGABALIN 50 MG PO CAPS: 50.0000 mg | ORAL_CAPSULE | Freq: Two times a day (BID) | ORAL | 2 refills | Status: DC

## 2022-12-07 NOTE — Telephone Encounter (Signed)
Pt called wanting to know why her LYRICA was denied

## 2022-12-08 ENCOUNTER — Ambulatory Visit: Payer: BC Managed Care – PPO | Admitting: Family Medicine

## 2022-12-08 MED ORDER — PREGABALIN 50 MG PO CAPS: 50.0000 mg | ORAL_CAPSULE | Freq: Two times a day (BID) | ORAL | 2 refills | Status: DC

## 2022-12-08 NOTE — Addendum Note (Signed)
Addended by: Glori Luis on: 12/08/2022 03:28 PM   Modules accepted: Orders

## 2022-12-12 ENCOUNTER — Ambulatory Visit (INDEPENDENT_AMBULATORY_CARE_PROVIDER_SITE_OTHER): Payer: BC Managed Care – PPO | Admitting: Family Medicine

## 2022-12-12 ENCOUNTER — Encounter: Payer: Self-pay | Admitting: Family Medicine

## 2022-12-12 VITALS — BP 130/84 | HR 60 | Temp 97.8°F | Ht 67.0 in | Wt 260.6 lb

## 2022-12-12 DIAGNOSIS — F32A Depression, unspecified: Secondary | ICD-10-CM | POA: Diagnosis not present

## 2022-12-12 DIAGNOSIS — F419 Anxiety disorder, unspecified: Secondary | ICD-10-CM

## 2022-12-12 DIAGNOSIS — R350 Frequency of micturition: Secondary | ICD-10-CM

## 2022-12-12 LAB — POCT URINALYSIS DIPSTICK
Glucose, UA: NEGATIVE
Leukocytes, UA: NEGATIVE
Nitrite, UA: NEGATIVE
Protein, UA: NEGATIVE
Spec Grav, UA: 1.025 (ref 1.010–1.025)
Urobilinogen, UA: 4 U/dL — AB
pH, UA: 6 (ref 5.0–8.0)

## 2022-12-12 LAB — URINALYSIS, MICROSCOPIC ONLY: RBC / HPF: NONE SEEN (ref 0–?)

## 2022-12-12 MED ORDER — FLUCONAZOLE 150 MG PO TABS: 150.0000 mg | ORAL_TABLET | ORAL | 0 refills | Status: AC

## 2022-12-12 MED ORDER — NITROFURANTOIN MONOHYD MACRO 100 MG PO CAPS: 100.0000 mg | ORAL_CAPSULE | Freq: Two times a day (BID) | ORAL | 0 refills | Status: DC

## 2022-12-12 NOTE — Assessment & Plan Note (Signed)
Chronic issue.  She will be referred to see a therapist.  She will continue Effexor 150 mg daily.

## 2022-12-12 NOTE — Progress Notes (Signed)
Marikay Alar, MD Phone: [phone]  Jill Hawkins is a 47 y.o. female who presents today for same-day visit.  Vaginal itching/urinary frequency: Patient reports urinary frequency is worse than typical.  She also has urinary urgency.  She notes some vaginal itching.  No vaginal discharge.  No dysuria.  No hematuria.  She has occasional vaginal spotting with her IUD.  She is not sexually active.  Depression: Patient notes this mild depression.  She is to see a therapist after her mom, dad, and sibling passed away in a short period of time.  She is interested in seeing another therapist.  No SI.  Social History   Tobacco Use  Smoking Status Former   Types: Cigarettes  Smokeless Tobacco Never  Tobacco Comments   Smoker casually in her High School Years    Current Outpatient Medications on File Prior to Visit  Medication Sig Dispense Refill   ALPRAZolam (XANAX) 1 MG tablet TAKE 1 TABLET(1 MG) BY MOUTH DAILY AS NEEDED FOR ANXIETY 15 tablet 0   B Complex Vitamins (VITAMIN-B COMPLEX) TABS Take 1 tablet by mouth daily.     clobetasol cream (TEMOVATE) 0.05 % Apply 1 Application topically 2 (two) times daily. Prn right lower leg and arms prn x up to 2 weeks 60 g 1   Continuous Blood Gluc Receiver (FREESTYLE LIBRE 14 DAY READER) DEVI Use to check sugar 4 times daily 1 each 0   Continuous Blood Gluc Sensor (FREESTYLE LIBRE 14 DAY SENSOR) MISC Apply once every 14 days. 6 each 1   Crisaborole (EUCRISA) 2 % OINT Apply 1 application topically in the morning and at bedtime. Apply to affected areas of hands (Patient taking differently: Apply 1 application  topically 2 (two) times daily as needed. Apply to affected areas of hands) 60 g 11   cyanocobalamin (,VITAMIN B-12,) 1000 MCG/ML injection Inject 1,000 mcg into the muscle every 30 (thirty) days.     estradiol (ESTRACE) 0.1 MG/GM vaginal cream Place 0.5 g vaginally 2 (two) times a week. Place 0.5g nightly for two weeks then twice a week after 30  g 11   etanercept (ENBREL SURECLICK) 50 MG/ML injection Inject 50 mLs into the skin once a week. On friday     fexofenadine (ALLEGRA) 180 MG tablet TAKE 1 TABLET BY MOUTH DAILY 90 tablet 1   folic acid (FOLVITE) 1 MG tablet TAKE 1 TABLET BY MOUTH DAILY 90 tablet 0   hydrocortisone (ANUSOL-HC) 2.5 % rectal cream Place 1 Application rectally 2 (two) times daily. 30 g 0   hydrocortisone 2.5 % cream Apply to affected areas twice daily as needed for up to 1 week. Can repeat in a month if needed 28 g 3   ipratropium (ATROVENT) 0.06 % nasal spray Place 2 sprays into both nostrils 4 (four) times daily. (Patient taking differently: Place 2 sprays into both nostrils 4 (four) times daily as needed.) 15 mL 12   ketoconazole (NIZORAL) 2 % cream Apply to affected area twice daily as needed 30 g 7   levonorgestrel (LILETTA) 18.6 MCG/DAY IUD IUD by Intrauterine route.     lidocaine (XYLOCAINE) 5 % ointment Apply 1 Application topically 4 (four) times daily as needed. 35 g 0   montelukast (SINGULAIR) 10 MG tablet TAKE ONE TABLET BY MOUTH EVERY NIGHT AT BEDTIME 30 tablet 2   Multiple Vitamin (MULTI-VITAMINS) TABS Take 1 tablet by mouth daily.     nifedipine 0.3 % ointment Place 1 Application rectally in the morning and at bedtime.  For 8 weeks 100 g 0   nystatin-triamcinolone ointment (MYCOLOG) Apply 1 application topically 2 (two) times daily. (Patient taking differently: Apply 1 application  topically 2 (two) times daily as needed.) 30 g 0   ondansetron (ZOFRAN-ODT) 4 MG disintegrating tablet 4 mg every 8 (eight) hours as needed.     pregabalin (LYRICA) 50 MG capsule Take 1 capsule (50 mg total) by mouth 2 (two) times daily. 60 capsule 2   promethazine (PHENERGAN) 25 MG tablet Take 1 tablet (25 mg total) by mouth every 8 (eight) hours as needed for nausea or vomiting. 40 tablet 0   SUMAtriptan (IMITREX) 50 MG tablet TAKE 1 TABLET BY MOUTH AT ONSET OF MIGRAINE; MAY REPEAT 1 TABLET IN 2 HOURS IF NEEDED. 9 tablet 0    traZODone (DESYREL) 50 MG tablet Take 0.5-1 tablets (25-50 mg total) by mouth at bedtime as needed for sleep. 30 tablet 3   venlafaxine XR (EFFEXOR-XR) 150 MG 24 hr capsule TAKE ONE CAPSULE BY MOUTH DAILY WITH BREAKFAST 90 capsule 3   [DISCONTINUED] buPROPion (WELLBUTRIN) 100 MG tablet TAKE ONE TABLET BY MOUTH THREE TIMES A DAY 90 tablet 0   No current facility-administered medications on file prior to visit.     ROS see history of present illness  Objective  Physical Exam Vitals:   12/12/22 1000  BP: 130/84  Pulse: 60  Temp: 97.8 F (36.6 C)  SpO2: 96%    BP Readings from Last 3 Encounters:  12/12/22 130/84  11/17/22 116/80  10/23/22 120/72   Wt Readings from Last 3 Encounters:  12/12/22 260 lb 9.6 oz (118.2 kg)  11/17/22 254 lb 3.2 oz (115.3 kg)  10/23/22 266 lb (120.7 kg)    Physical Exam Constitutional:      General: She is not in acute distress.    Appearance: She is not diaphoretic.  Pulmonary:     Effort: Pulmonary effort is normal.  Skin:    General: Skin is warm and dry.  Neurological:     Mental Status: She is alert.      Assessment/Plan: Please see individual problem list.  Urinary frequency Assessment & Plan: Patient with urinary frequency and vaginal itching.  Discussed this could represent a UTI and/or a yeast infection.  Will empirically treat for yeast infection and UTI.  Diflucan and Macrobid sent to pharmacy.  Will send urine for culture microscopy.  If not improving with this she will let us know.  Orders: -     POCT urinalysis dipstick -     Urine Culture -     Urine Microscopic  Anxiety and depression Assessment & Plan: Chronic issue.  She will be referred to see a therapist.  She will continue Effexor 150 mg daily.  Orders: -     Ambulatory referral to Psychology  Other orders -     Fluconazole; Take 1 tablet (150 mg total) by mouth every 3 (three) days for 2 doses.  Dispense: 2 tablet; Refill: 0 -     Nitrofurantoin Monohyd  Macro; Take 1 capsule (100 mg total) by mouth 2 (two) times daily.  Dispense: 14 capsule; Refill: 0     Return in about 6 months (around 06/12/2023) for Transfer of care with [facility].   Marikay Alar, MD [facility]

## 2022-12-12 NOTE — Assessment & Plan Note (Signed)
Patient with urinary frequency and vaginal itching.  Discussed this could represent a UTI and/or a yeast infection.  Will empirically treat for yeast infection and UTI.  Diflucan and Macrobid sent to pharmacy.  Will send urine for culture microscopy.  If not improving with this she will let us know.

## 2022-12-12 NOTE — Patient Instructions (Signed)
Nice to see you. We are going to send your urine for culture to make sure that you have a UTI.  I am going to go ahead and treat you for a UTI with Macrobid.  We are also going to treat you with Diflucan for possible yeast infection.  If your symptoms are not improving with this please let us know.

## 2022-12-13 LAB — URINE CULTURE
MICRO NUMBER:: 15596910
SPECIMEN QUALITY:: ADEQUATE

## 2022-12-15 ENCOUNTER — Other Ambulatory Visit: Payer: Self-pay | Admitting: Family Medicine

## 2022-12-15 DIAGNOSIS — R319 Hematuria, unspecified: Secondary | ICD-10-CM

## 2022-12-25 ENCOUNTER — Encounter (HOSPITAL_BASED_OUTPATIENT_CLINIC_OR_DEPARTMENT_OTHER): Payer: Self-pay | Admitting: Obstetrics & Gynecology

## 2022-12-25 ENCOUNTER — Other Ambulatory Visit (HOSPITAL_BASED_OUTPATIENT_CLINIC_OR_DEPARTMENT_OTHER): Payer: Self-pay | Admitting: Obstetrics & Gynecology

## 2022-12-25 DIAGNOSIS — R6882 Decreased libido: Secondary | ICD-10-CM

## 2022-12-25 MED ORDER — ADDYI 100 MG PO TABS: 1.0000 | ORAL_TABLET | Freq: Every day | ORAL | 5 refills | Status: DC

## 2023-01-01 ENCOUNTER — Telehealth (HOSPITAL_BASED_OUTPATIENT_CLINIC_OR_DEPARTMENT_OTHER): Payer: Self-pay

## 2023-01-01 NOTE — Telephone Encounter (Signed)
LMOM for patient to call office or send a copy of Rx drug card through MyChart. Prior Authorization can not be completed until we have that information. tbw

## 2023-01-04 ENCOUNTER — Encounter: Payer: Self-pay | Admitting: Cardiovascular Disease

## 2023-01-04 ENCOUNTER — Ambulatory Visit: Payer: BC Managed Care – PPO | Attending: Cardiovascular Disease | Admitting: Cardiovascular Disease

## 2023-01-04 VITALS — BP 112/80 | HR 59 | Ht 67.0 in | Wt 263.6 lb

## 2023-01-04 DIAGNOSIS — R002 Palpitations: Secondary | ICD-10-CM

## 2023-01-04 NOTE — Patient Instructions (Signed)
Medication Instructions:  No changes *If you need a refill on your cardiac medications before your next appointment, please call your pharmacy*   Lab Work: None ordered If you have labs (blood work) drawn today and your tests are completely normal, you will receive your results only by: MyChart Message (if you have MyChart) OR A paper copy in the mail If you have any lab test that is abnormal or we need to change your treatment, we will call you to review the results.   Testing/Procedures: None ordered   Follow-Up: At [facility], you and your health needs are our priority.  As part of our continuing mission to provide you with exceptional heart care, we have created designated Provider Care Teams.  These Care Teams include your primary Cardiologist (physician) and Advanced Practice Providers (APPs -  Physician Assistants and Nurse Practitioners) who all work together to provide you with the care you need, when you need it.  We recommend signing up for the patient portal called "MyChart".  Sign up information is provided on this After Visit Summary.  MyChart is used to connect with patients for Virtual Visits (Telemedicine).  Patients are able to view lab/test results, encounter notes, upcoming appointments, etc.  Non-urgent messages can be sent to your provider as well.   To learn more about what you can do with MyChart, go to [email or web address].    Your next appointment:   Follow up as needed  

## 2023-01-04 NOTE — Progress Notes (Signed)
Cardiology Office Note   Date:  01/04/2023   ID:  Jill Hawkins, DOB 04-30-1975, MRN [id number]  PCP:  Glori Luis, MD  Cardiologist:   Lorine Bears, MD   Chief Complaint  Patient presents with   New Patient (Initial Visit)    Referred for cardiac evaluation of Palpitations.  Previous cardiac work up in 2022 with Dr. Welton Flakes at [facility].        History of Present Illness: Jill Hawkins is a 47 y.o. female who presents for cardiac evaluation.  She works at [employer] office.  She has history of morbid obesity status post gastric bypass in 2015, rheumatoid arthritis, hypoglycemia and migraines.  She was evaluated by Dr. Welton Flakes in 2022 for dyspnea and palpitations.  She underwent a nuclear stress test which showed possible anterior wall ischemia.  Thus, she underwent cardiac CTA which showed no evidence of coronary artery disease with a calcium score of 0.  She had an echocardiogram done in April 2022 which showed normal LV systolic function with mild biatrial enlargement and no significant valvular abnormalities. She has been doing reasonably well with no chest pain, shortness of breath or palpitations at this time. She does report previous palpitations with previous Holter monitor but her symptoms resolved.    Past Medical History:  Diagnosis Date   Anemia    hx iron infusions   Anxiety    Chronic pain    r/t ddd   Depression    Frequent headaches    GERD (gastroesophageal reflux disease)    History of fainting spells of unknown cause    History of hiatal hernia    History of kidney stones    Hypertension    no issues since gastric bypass   Migraine    PONV (postoperative nausea and vomiting)    Rheumatoid arthritis (HCC)    UTI (lower urinary tract infection)     Past Surgical History:  Procedure Laterality Date   BOTOX INJECTION N/A 06/07/2022   Procedure: BOTOX INJECTION;  Surgeon: Henrene Dodge, MD;  Location: [facility] ORS;  Service: General;   Laterality: N/A;   CARPAL TUNNEL RELEASE  02/28/2003   CESAREAN SECTION     x 2   CHOLECYSTECTOMY  02/27/2013   GASTRIC BYPASS  04/14/2013   GROWTH PLATE SURGERY  40/98/1191   Right hip   HIATAL HERNIA REPAIR     SPHINCTEROTOMY N/A 06/07/2022   Procedure: SPHINCTEROTOMY, botox chemical;  Surgeon: Henrene Dodge, MD;  Location: [facility] ORS;  Service: General;  Laterality: N/A;     Current Outpatient Medications  Medication Sig Dispense Refill   ALPRAZolam (XANAX) 1 MG tablet TAKE 1 TABLET(1 MG) BY MOUTH DAILY AS NEEDED FOR ANXIETY 15 tablet 0   clobetasol cream (TEMOVATE) 0.05 % Apply 1 Application topically 2 (two) times daily. Prn right lower leg and arms prn x up to 2 weeks 60 g 1   Crisaborole (EUCRISA) 2 % OINT Apply 1 application topically in the morning and at bedtime. Apply to affected areas of hands (Patient taking differently: Apply 1 application  topically 2 (two) times daily as needed. Apply to affected areas of hands) 60 g 11   cyanocobalamin (,VITAMIN B-12,) 1000 MCG/ML injection Inject 1,000 mcg into the muscle every 7 (seven) days.     estradiol (ESTRACE) 0.1 MG/GM vaginal cream Place 0.5 g vaginally 2 (two) times a week. Place 0.5g nightly for two weeks then twice a week after 30 g 11  etanercept (ENBREL SURECLICK) 50 MG/ML injection Inject 50 mLs into the skin once a week. On friday     fexofenadine (ALLEGRA) 180 MG tablet TAKE 1 TABLET BY MOUTH DAILY 90 tablet 1   folic acid (FOLVITE) 1 MG tablet TAKE 1 TABLET BY MOUTH DAILY 90 tablet 0   hydrocortisone (ANUSOL-HC) 2.5 % rectal cream Place 1 Application rectally 2 (two) times daily. 30 g 0   hydrocortisone 2.5 % cream Apply to affected areas twice daily as needed for up to 1 week. Can repeat in a month if needed 28 g 3   ketoconazole (NIZORAL) 2 % cream Apply to affected area twice daily as needed 30 g 7   levonorgestrel (LILETTA) 18.6 MCG/DAY IUD IUD by Intrauterine route.     montelukast (SINGULAIR) 10 MG tablet TAKE ONE  TABLET BY MOUTH EVERY NIGHT AT BEDTIME 30 tablet 2   Multiple Vitamin (MULTI-VITAMINS) TABS Take 1 tablet by mouth daily.     nifedipine 0.3 % ointment Place 1 Application rectally in the morning and at bedtime. For 8 weeks 100 g 0   nystatin-triamcinolone ointment (MYCOLOG) Apply 1 application topically 2 (two) times daily. (Patient taking differently: Apply 1 application  topically 2 (two) times daily as needed.) 30 g 0   ondansetron (ZOFRAN-ODT) 4 MG disintegrating tablet 4 mg every 8 (eight) hours as needed.     pregabalin (LYRICA) 50 MG capsule Take 1 capsule (50 mg total) by mouth 2 (two) times daily. 60 capsule 2   promethazine (PHENERGAN) 25 MG tablet Take 1 tablet (25 mg total) by mouth every 8 (eight) hours as needed for nausea or vomiting. 40 tablet 0   SUMAtriptan (IMITREX) 50 MG tablet TAKE 1 TABLET BY MOUTH AT ONSET OF MIGRAINE; MAY REPEAT 1 TABLET IN 2 HOURS IF NEEDED. 9 tablet 0   venlafaxine XR (EFFEXOR-XR) 150 MG 24 hr capsule TAKE ONE CAPSULE BY MOUTH DAILY WITH BREAKFAST 90 capsule 3   B Complex Vitamins (VITAMIN-B COMPLEX) TABS Take 1 tablet by mouth daily. (Patient not taking: Reported on 01/04/2023)     Continuous Blood Gluc Receiver (FREESTYLE LIBRE 14 DAY READER) DEVI Use to check sugar 4 times daily (Patient not taking: Reported on 01/04/2023) 1 each 0   Continuous Blood Gluc Sensor (FREESTYLE LIBRE 14 DAY SENSOR) MISC Apply once every 14 days. (Patient not taking: Reported on 01/04/2023) 6 each 1   Flibanserin (ADDYI) 100 MG TABS Take 1 tablet (100 mg total) by mouth daily. (Patient not taking: Reported on 01/04/2023) 30 tablet 5   ipratropium (ATROVENT) 0.06 % nasal spray Place 2 sprays into both nostrils 4 (four) times daily. (Patient not taking: Reported on 01/04/2023) 15 mL 12   lidocaine (XYLOCAINE) 5 % ointment Apply 1 Application topically 4 (four) times daily as needed. (Patient not taking: Reported on 01/04/2023) 35 g 0   nitrofurantoin, macrocrystal-monohydrate,  (MACROBID) 100 MG capsule Take 1 capsule (100 mg total) by mouth 2 (two) times daily. (Patient not taking: Reported on 01/04/2023) 14 capsule 0   traZODone (DESYREL) 50 MG tablet Take 0.5-1 tablets (25-50 mg total) by mouth at bedtime as needed for sleep. (Patient not taking: Reported on 01/04/2023) 30 tablet 3   No current facility-administered medications for this visit.    Allergies:   Penicillins, Oxycodone-acetaminophen, Tramadol, Codeine, and Tape    Social History:  The patient  reports that she has quit smoking. Her smoking use included cigarettes. She has never used smokeless tobacco. She reports that she does  not drink alcohol and does not use drugs.   Family History:  The patient's family history includes Alcohol abuse in her father; Arthritis in her mother; Cancer in her father and maternal grandfather; Diabetes in her maternal grandmother and mother; Heart disease in her maternal grandmother and mother; Hypertension in her father, maternal grandfather, maternal grandmother, and mother; Mental illness in her brother, maternal grandmother, mother, and paternal grandmother.    ROS:  Please see the history of present illness.   Otherwise, review of systems are positive for none.   All other systems are reviewed and negative.    PHYSICAL EXAM: VS:  BP 112/80 (BP Location: Right Arm, Patient Position: Sitting, Cuff Size: Large)   Pulse (!) 59   Ht 5\' 7"  (1.702 m)   Wt 263 lb 9.6 oz (119.6 kg)   SpO2 100%   BMI 41.29 kg/m  , BMI Body mass index is 41.29 kg/m. GEN: Well nourished, well developed, in no acute distress  HEENT: normal  Neck: no JVD, carotid bruits, or masses Cardiac: RRR; no murmurs, rubs, or gallops,no edema  Respiratory:  clear to auscultation bilaterally, normal work of breathing GI: soft, nontender, nondistended, + BS MS: no deformity or atrophy  Skin: warm and dry, no rash Neuro:  Strength and sensation are intact Psych: euthymic mood, full affect   EKG:   EKG is ordered today. The ekg ordered today demonstrates : Sinus bradycardia When compared with ECG of 07-Jun-2022 09:14, No significant change was found    Recent Labs: 01/30/2022: Hemoglobin 11.3; Platelets 158.0; TSH 0.95    Lipid Panel    Component Value Date/Time   CHOL 116 04/30/2021 1001   TRIG 20 04/30/2021 1001   HDL 52 04/30/2021 1001   CHOLHDL 2.2 04/30/2021 1001   VLDL 4 04/30/2021 1001   LDLCALC 60 04/30/2021 1001      Wt Readings from Last 3 Encounters:  01/04/23 263 lb 9.6 oz (119.6 kg)  12/12/22 260 lb 9.6 oz (118.2 kg)  11/17/22 254 lb 3.2 oz (115.3 kg)          01/04/2023    3:28 PM  PAD Screen  Previous PAD dx? No  Previous surgical procedure? No  Pain with walking? No  Feet/toe relief with dangling? No  Painful, non-healing ulcers? No  Extremities discolored? No      ASSESSMENT AND PLAN:  1.  Palpitations: She reports that her symptoms resolved and thus there is no utility of outpatient monitor at this time.  Her baseline EKG is unremarkable and cardiac exam is within normal limits.  Previous cardiac workup including cardiac CTA in 2022 which was normal and an echocardiogram in 2022 that was unremarkable. She can follow-up with me as needed.    Disposition:   FU as needed.  Signed,  Lorine Bears, MD  01/04/2023 3:37 PM    [facility]

## 2023-01-08 ENCOUNTER — Other Ambulatory Visit (HOSPITAL_BASED_OUTPATIENT_CLINIC_OR_DEPARTMENT_OTHER): Payer: Self-pay | Admitting: Obstetrics & Gynecology

## 2023-01-08 ENCOUNTER — Encounter (HOSPITAL_BASED_OUTPATIENT_CLINIC_OR_DEPARTMENT_OTHER): Payer: Self-pay | Admitting: Obstetrics & Gynecology

## 2023-01-08 MED ORDER — ADDYI 100 MG PO TABS: 1.0000 | ORAL_TABLET | Freq: Every day | ORAL | 2 refills | Status: DC

## 2023-01-08 NOTE — Telephone Encounter (Signed)
Patient was denied. Sent patient a message letting her know that insurance denied her. I also sent her information about the [facility] pharmacy letting her know that she can get a 30day supply for $85 or a 90 day supply for $199. Advised patient to let us know what she decides to do so that we can send a script

## 2023-02-04 ENCOUNTER — Other Ambulatory Visit: Payer: Self-pay | Admitting: Family Medicine

## 2023-02-04 DIAGNOSIS — R519 Headache, unspecified: Secondary | ICD-10-CM

## 2023-02-26 ENCOUNTER — Encounter: Payer: Self-pay | Admitting: Anesthesiology

## 2023-02-26 ENCOUNTER — Other Ambulatory Visit: Payer: Self-pay | Admitting: Anesthesiology

## 2023-02-26 DIAGNOSIS — G894 Chronic pain syndrome: Secondary | ICD-10-CM

## 2023-02-26 DIAGNOSIS — G8929 Other chronic pain: Secondary | ICD-10-CM

## 2023-03-05 ENCOUNTER — Encounter: Payer: Self-pay | Admitting: Anesthesiology

## 2023-03-07 ENCOUNTER — Ambulatory Visit: Payer: BC Managed Care – PPO | Admitting: Obstetrics and Gynecology

## 2023-03-10 ENCOUNTER — Other Ambulatory Visit: Payer: BC Managed Care – PPO

## 2023-03-14 ENCOUNTER — Other Ambulatory Visit: Payer: Self-pay | Admitting: Anesthesiology

## 2023-03-14 DIAGNOSIS — M545 Low back pain, unspecified: Secondary | ICD-10-CM

## 2023-03-14 DIAGNOSIS — G8929 Other chronic pain: Secondary | ICD-10-CM

## 2023-03-14 DIAGNOSIS — M5431 Sciatica, right side: Secondary | ICD-10-CM

## 2023-03-21 ENCOUNTER — Ambulatory Visit
Admission: RE | Admit: 2023-03-21 | Discharge: 2023-03-21 | Disposition: A | Discharge status: Home / Self Care | Payer: BC Managed Care – PPO | Source: Ambulatory Visit | Attending: Anesthesiology | Admitting: Anesthesiology

## 2023-03-21 DIAGNOSIS — G8929 Other chronic pain: Secondary | ICD-10-CM

## 2023-03-21 DIAGNOSIS — G894 Chronic pain syndrome: Secondary | ICD-10-CM

## 2023-03-22 ENCOUNTER — Encounter: Payer: Self-pay | Admitting: Anesthesiology

## 2023-03-23 ENCOUNTER — Encounter: Payer: Self-pay | Admitting: Family Medicine

## 2023-03-23 NOTE — Telephone Encounter (Signed)
Would you like pt to scheduled an appointment for this issue?

## 2023-03-23 NOTE — Telephone Encounter (Signed)
She needs an appointment.

## 2023-03-24 ENCOUNTER — Ambulatory Visit
Admission: RE | Admit: 2023-03-24 | Discharge: 2023-03-24 | Disposition: A | Discharge status: Home / Self Care | Payer: BC Managed Care – PPO | Source: Ambulatory Visit | Attending: Anesthesiology | Admitting: Anesthesiology

## 2023-03-24 DIAGNOSIS — M5431 Sciatica, right side: Secondary | ICD-10-CM

## 2023-03-24 DIAGNOSIS — G8929 Other chronic pain: Secondary | ICD-10-CM

## 2023-03-24 DIAGNOSIS — M545 Low back pain, unspecified: Secondary | ICD-10-CM

## 2023-03-26 ENCOUNTER — Telehealth: Payer: Self-pay

## 2023-03-26 ENCOUNTER — Ambulatory Visit (INDEPENDENT_AMBULATORY_CARE_PROVIDER_SITE_OTHER): Payer: BC Managed Care – PPO | Admitting: Family Medicine

## 2023-03-26 ENCOUNTER — Encounter: Payer: Self-pay | Admitting: Family Medicine

## 2023-03-26 ENCOUNTER — Other Ambulatory Visit (HOSPITAL_COMMUNITY): Payer: Self-pay

## 2023-03-26 VITALS — BP 110/70 | HR 64 | Temp 98.2°F | Resp 18 | Ht 67.0 in | Wt 257.2 lb

## 2023-03-26 DIAGNOSIS — F32A Depression, unspecified: Secondary | ICD-10-CM

## 2023-03-26 DIAGNOSIS — R350 Frequency of micturition: Secondary | ICD-10-CM | POA: Diagnosis not present

## 2023-03-26 DIAGNOSIS — K649 Unspecified hemorrhoids: Secondary | ICD-10-CM

## 2023-03-26 DIAGNOSIS — G8929 Other chronic pain: Secondary | ICD-10-CM

## 2023-03-26 DIAGNOSIS — R7989 Other specified abnormal findings of blood chemistry: Secondary | ICD-10-CM

## 2023-03-26 LAB — POC URINALSYSI DIPSTICK (AUTOMATED)
Bilirubin, UA: NEGATIVE
Blood, UA: NEGATIVE
Glucose, UA: NEGATIVE
Ketones, UA: NEGATIVE
Leukocytes, UA: NEGATIVE
Nitrite, UA: NEGATIVE
Protein, UA: NEGATIVE
Spec Grav, UA: 1.02 (ref 1.010–1.025)
Urobilinogen, UA: 2 U/dL — AB
pH, UA: 5.5 (ref 5.0–8.0)

## 2023-03-26 MED ORDER — HYDROCORTISONE (PERIANAL) 2.5 % EX CREA: 1.0000 | TOPICAL_CREAM | Freq: Two times a day (BID) | CUTANEOUS | 0 refills | Status: DC

## 2023-03-26 NOTE — Progress Notes (Signed)
4

## 2023-03-26 NOTE — Progress Notes (Signed)
Marikay Alar, MD Phone: [phone]  Jill Hawkins is a 48 y.o. female who presents today for same day visit.   Hemorrhoids/constipation: Patient notes she has been constipated recently.  She had an x-ray of her back that revealed a large amount of stool.  She had some episodes of vomiting last week after eating dinner and she got in touch with her bariatric physician who sent in some medication to help her have a bowel movement.  2 days later she had a bowel movement and subsequently has been having soft stools.  She was also taking MiraLAX and Colace.  She has had some bleeding from her hemorrhoids with her bowel movements.  Some throbbing pain.  She is been using over-the-counter medication with little benefit.  Notes she has discussed the bleeding in the past with her GI physician and she was advised it was related to hemorrhoids.  Urine frequency: Patient notes recent onset urine frequency with some urgency and mild discomfort that makes her think she has a UTI.  Social History   Tobacco Use  Smoking Status Former   Types: Cigarettes  Smokeless Tobacco Never  Tobacco Comments   Smoker casually in her High School Years    Current Outpatient Medications on File Prior to Visit  Medication Sig Dispense Refill   ALPRAZolam (XANAX) 1 MG tablet TAKE 1 TABLET(1 MG) BY MOUTH DAILY AS NEEDED FOR ANXIETY 15 tablet 0   B Complex Vitamins (VITAMIN-B COMPLEX) TABS Take 1 tablet by mouth daily.     Crisaborole (EUCRISA) 2 % OINT Apply 1 application topically in the morning and at bedtime. Apply to affected areas of hands (Patient taking differently: Apply 1 application  topically 2 (two) times daily as needed. Apply to affected areas of hands) 60 g 11   cyanocobalamin (,VITAMIN B-12,) 1000 MCG/ML injection Inject 1,000 mcg into the muscle every 7 (seven) days.     etanercept (ENBREL SURECLICK) 50 MG/ML injection Inject 50 mLs into the skin once a week. On friday     fexofenadine (ALLEGRA)  180 MG tablet TAKE 1 TABLET BY MOUTH DAILY 90 tablet 1   Flibanserin (ADDYI) 100 MG TABS Take 1 tablet (100 mg total) by mouth daily. 30 tablet 2   folic acid (FOLVITE) 1 MG tablet TAKE 1 TABLET BY MOUTH DAILY 90 tablet 0   levonorgestrel (LILETTA) 18.6 MCG/DAY IUD IUD by Intrauterine route.     lidocaine (XYLOCAINE) 5 % ointment Apply 1 Application topically 4 (four) times daily as needed. 35 g 0   methocarbamol (ROBAXIN) 500 MG tablet Take 500 mg by mouth 3 (three) times daily.     montelukast (SINGULAIR) 10 MG tablet TAKE ONE TABLET BY MOUTH EVERY NIGHT AT BEDTIME 30 tablet 2   Multiple Vitamin (MULTI-VITAMINS) TABS Take 1 tablet by mouth daily.     ondansetron (ZOFRAN-ODT) 4 MG disintegrating tablet 4 mg every 8 (eight) hours as needed.     pregabalin (LYRICA) 50 MG capsule Take 1 capsule (50 mg total) by mouth 2 (two) times daily. 60 capsule 2   promethazine (PHENERGAN) 25 MG tablet Take 1 tablet (25 mg total) by mouth every 8 (eight) hours as needed for nausea or vomiting. 40 tablet 0   SUMAtriptan (IMITREX) 50 MG tablet TAKE 1 TABLET BY MOUTH AT ONSET OF MIGRAINE; MAY REPEAT 1 TABLET IN 2 HOURS IF NEEDED. 9 tablet 0   Upadacitinib ER (RINVOQ) 15 MG TB24 Take 15 mg by mouth daily.     venlafaxine XR (  EFFEXOR-XR) 150 MG 24 hr capsule TAKE ONE CAPSULE BY MOUTH DAILY WITH BREAKFAST 90 capsule 3   [DISCONTINUED] buPROPion (WELLBUTRIN) 100 MG tablet TAKE ONE TABLET BY MOUTH THREE TIMES A DAY 90 tablet 0   No current facility-administered medications on file prior to visit.     ROS see history of present illness  Objective  Physical Exam Vitals:   03/26/23 1121  BP: 110/70  Pulse: 64  Resp: 18  Temp: 98.2 F (36.8 C)  SpO2: 99%    BP Readings from Last 3 Encounters:  03/26/23 110/70  01/04/23 112/80  12/12/22 130/84   Wt Readings from Last 3 Encounters:  03/26/23 257 lb 4 oz (116.7 kg)  01/04/23 263 lb 9.6 oz (119.6 kg)  12/12/22 260 lb 9.6 oz (118.2 kg)    Physical  Exam Genitourinary:    Comments: Kelly CMA served as chaperone, external hemorrhoids noted, tender, mildly firm, no evidence of thrombosis     Assessment/Plan: Please see individual problem list.  Urinary frequency Assessment & Plan: Possibly related to UTI.  Patient will have urine testing done and then we will consider antibiotics once her urine results return.  Orders: -     POCT Urinalysis Dipstick (Automated) -     Urine Culture  Hemorrhoids, unspecified hemorrhoid type Assessment & Plan: Chronic issue.  Has flared up recently with constipation.  Over-the-counter medicines not working.  We will trial Anusol as prescribed.  She will discuss other treatment options with her GI physician.  Discussed adequate treatment of her constipation.  She will continue MiraLAX over-the-counter for that and can use Colace as needed.  If this things or not effective she can try the medication sent in by her bariatric surgeon.  Orders: -     Hydrocortisone (Perianal); Place 1 Application rectally 2 (two) times daily.  Dispense: 30 g; Refill: 0     Return if symptoms worsen or fail to improve.   Marikay Alar, MD [facility]

## 2023-03-26 NOTE — Telephone Encounter (Signed)
Pharmacy Patient Advocate Encounter  Received notification from [facility] that Prior Authorization for Addyi has been APPROVED from 03/26/2023 to 09/23/2023  PA #/Case ID/Reference #: [id number]

## 2023-03-26 NOTE — Assessment & Plan Note (Signed)
Chronic issue.  Has flared up recently with constipation.  Over-the-counter medicines not working.  We will trial Anusol as prescribed.  She will discuss other treatment options with her GI physician.  Discussed adequate treatment of her constipation.  She will continue MiraLAX over-the-counter for that and can use Colace as needed.  If this things or not effective she can try the medication sent in by her bariatric surgeon.

## 2023-03-26 NOTE — Telephone Encounter (Signed)
Pharmacy Patient Advocate Encounter   Received notification from CoverMyMeds that prior authorization for Addyi 100MG  tablets is required/requested.   Insurance verification completed.   The patient is insured through [facility] .   Per test claim: PA required; PA submitted to above mentioned insurance via CoverMyMeds Key/confirmation #/EOC [id number] Status is pending

## 2023-03-26 NOTE — Assessment & Plan Note (Signed)
Possibly related to UTI.  Patient will have urine testing done and then we will consider antibiotics once her urine results return.

## 2023-03-26 NOTE — Telephone Encounter (Signed)
Order placed for RUQ [place].

## 2023-03-27 LAB — URINE CULTURE
MICRO NUMBER:: 16001690
SPECIMEN QUALITY:: ADEQUATE

## 2023-03-27 NOTE — Telephone Encounter (Signed)
Left message to return call to our office.  Calling to let pt know that medication has been approved.

## 2023-03-28 ENCOUNTER — Encounter: Payer: Self-pay | Admitting: Surgery

## 2023-03-28 ENCOUNTER — Ambulatory Visit: Payer: BC Managed Care – PPO | Admitting: Obstetrics and Gynecology

## 2023-03-28 NOTE — Telephone Encounter (Signed)
Called Patient to let her know that the Addyi has been approved from 03/26/23 to 09/23/23.

## 2023-03-30 ENCOUNTER — Ambulatory Visit
Admission: RE | Admit: 2023-03-30 | Discharge: 2023-03-30 | Disposition: A | Discharge status: Home / Self Care | Payer: BC Managed Care – PPO | Source: Ambulatory Visit | Attending: Family Medicine | Admitting: Family Medicine

## 2023-03-30 DIAGNOSIS — R7989 Other specified abnormal findings of blood chemistry: Secondary | ICD-10-CM | POA: Insufficient documentation

## 2023-04-02 ENCOUNTER — Encounter: Payer: Self-pay | Admitting: Surgery

## 2023-04-02 ENCOUNTER — Ambulatory Visit (INDEPENDENT_AMBULATORY_CARE_PROVIDER_SITE_OTHER): Payer: BC Managed Care – PPO | Admitting: Surgery

## 2023-04-02 ENCOUNTER — Telehealth: Payer: Self-pay

## 2023-04-02 VITALS — BP 106/66 | HR 58 | Temp 98.0°F | Ht 67.0 in | Wt 259.0 lb

## 2023-04-02 DIAGNOSIS — K644 Residual hemorrhoidal skin tags: Secondary | ICD-10-CM | POA: Diagnosis not present

## 2023-04-02 DIAGNOSIS — K602 Anal fissure, unspecified: Secondary | ICD-10-CM | POA: Diagnosis not present

## 2023-04-02 NOTE — Telephone Encounter (Signed)
Noted. Please fax the results of the [place] to her GI doctor noting that we wanted the patient to follow-up with them for the fatty liver. It does not look like enbrel has fatty liver as a side effect.

## 2023-04-02 NOTE — Patient Instructions (Addendum)
You do have an anal fissure on exam as well as inflamed hemorrhoids. Continue to use the Anusol ointment and the Nifedipine cream.   Do sitz baths/warm tub soaks 1-2 times a day especially after a bowel movement.   Follow up here in 1 month.   Anal Fissure, Adult  An anal fissure is a small tear or crack in the tissue near the opening of the butt (anus). In most cases, bleeding from the tear or crack stops on its own within a few minutes. You may have bleeding each time you poop until the tear or crack heals. What are the causes? Passing a large or hard poop (stool). Having trouble pooping (constipation). Having watery poops (diarrhea). An inflammatory bowel disease, like Crohn's disease or ulcerative colitis. Childbirth. Infections. Anal sex. What are the signs or symptoms? Bleeding from the butt. Small amounts of blood on your poop. The blood coats the outside of the poop. It is not mixed with the poop. Small amounts of blood on the toilet paper or in the toilet after you poop. Pain when you poop. Itching or irritation around your butt. How is this treated? Treatment may include: Making changes to what you eat and drink. This can help if you have trouble pooping. Taking fiber supplements. These can help make your poop soft. Taking warm water baths (sitz baths). These can help heal the tear. Using creams and ointments. If other treatments do not work, you may need: A shot near the tear or crack (botulinum injection). Surgery to fix the tear or crack. Follow these instructions at home: Medicines Take over-the-counter and prescription medicines only as told by your doctor. This includes creams and ointments that have medicine in them. Use medicines to make your poop soft as told by your doctor. Treating constipation You may need to take these actions to prevent or treat trouble pooping: Drink enough fluid to keep your pee (urine) pale yellow. Eat foods that are high in fiber.  These include beans, whole grains, and fresh fruits and vegetables. Stay away from unripe bananas. Ripe bananas are a good choice. Limit foods that are high in fat and sugar. These include fried or sweet foods. Avoid dairy products. This includes milk.  General instructions  Keep the butt area clean and dry. Take a warm water bath as told by your doctor. Do not use soap. Contact a doctor if: You have more bleeding. You have a fever. You have watery poop that is mixed with blood. Your pain does not go away. Your problems get worse. This information is not intended to replace advice given to you by your health care provider. Make sure you discuss any questions you have with your health care provider. Document Revised: 03/02/2022 Document Reviewed: 03/02/2022 Elsevier Patient Education  2024 [employer].

## 2023-04-02 NOTE — Telephone Encounter (Signed)
Copied from CRM [id number]. Topic: Clinical - Lab/Test Results >> Apr 02, 2023  2:13 PM Fredrica W wrote: Reason for CRM: Patient returning missed called. Call was to discuss imaging results. Read note as written. Patient states she already has GI and would like information to be sent to them. Barton Fanny at [facility]. Patient would also like to know if there is any chance that etanercept (ENBREL SURECLICK) 50 MG/ML injection could be the cause of this. Thank You

## 2023-04-02 NOTE — Progress Notes (Signed)
04/02/2023  History of Present Illness: Jill Hawkins is a 48 y.o. female s/p EUA and chemical sphincterotomy with Botox on 06/07/22.  She presents today for follow up.  She reports that over the past month, she's been having issues with perianal pain with bowel movements.  She had issues with constipation recently and then started having the pain.  She also can feel firm tissue on the outside of anal verge and also can see blood in the toilet bowl and when she wipes.  She has history of both an anal fissure and also of external hemorrhoids.  After her fissure healed last year after Botox injection, we tried to schedule her for hemorrhoidectomy but she reports that there were other issues at home that took priority.  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  She had a refill left from Nifedipine compound given by [facility] last year and she started using this a few days ago.  Has not noticed any noticeable improvement.  She was also given Anusol cream by her PCP last week.  Currently reports alternating constipation and diarrhea.  She's scheduled to see GI at the end of the month and also is going to have a procedure with her bariatric surgeon this month to further evaluate.  Past Medical History: Past Medical History:  Diagnosis Date   Anemia    hx iron infusions   Anxiety    Chronic pain    r/t ddd   Depression    Frequent headaches    GERD (gastroesophageal reflux disease)    History of fainting spells of unknown cause    History of hiatal hernia    History of kidney stones    Hypertension    no issues since gastric bypass   Migraine    PONV (postoperative nausea and vomiting)    Rheumatoid arthritis (HCC)    UTI (lower urinary tract infection)      Past Surgical History: Past Surgical History:  Procedure Laterality Date   BOTOX INJECTION N/A 06/07/2022   Procedure: BOTOX INJECTION;  Surgeon: Henrene Dodge, MD;   Location: [facility] ORS;  Service: General;  Laterality: N/A;   CARPAL TUNNEL RELEASE  02/28/2003   CESAREAN SECTION     x 2   CHOLECYSTECTOMY  02/27/2013   GASTRIC BYPASS  04/14/2013   GROWTH PLATE SURGERY  45/40/9811   Right hip   HIATAL HERNIA REPAIR     SPHINCTEROTOMY N/A 06/07/2022   Procedure: SPHINCTEROTOMY, botox chemical;  Surgeon: Henrene Dodge, MD;  Location: [facility] ORS;  Service: General;  Laterality: N/A;    Home Medications: Prior to Admission medications   Medication Sig Start Date End Date Taking? Authorizing Provider  ALPRAZolam Prudy Feeler) 1 MG tablet TAKE 1 TABLET(1 MG) BY MOUTH DAILY AS NEEDED FOR ANXIETY 07/19/22  Yes Glori Luis, MD  B Complex Vitamins (VITAMIN-B COMPLEX) TABS Take 1 tablet by mouth daily.   Yes [provider]  cyanocobalamin (,VITAMIN B-12,) 1000 MCG/ML injection Inject 1,000 mcg into the muscle every 7 (seven) days. 05/19/21  Yes [provider]  etanercept (ENBREL SURECLICK) 50 MG/ML injection Inject 50 mLs into the skin once a week. On friday 03/22/20  Yes [provider]  fexofenadine (ALLEGRA) 180 MG tablet TAKE 1 TABLET BY MOUTH DAILY 12/07/22  Yes Glori Luis, MD  Flibanserin (ADDYI) 100 MG TABS Take 1 tablet (100 mg total) by mouth daily. 01/08/23  Yes Jerene Bears,  MD  folic acid (FOLVITE) 1 MG tablet TAKE 1 TABLET BY MOUTH DAILY 12/07/22  Yes Glori Luis, MD  hydrocortisone (ANUSOL-HC) 2.5 % rectal cream Place 1 Application rectally 2 (two) times daily. 03/26/23  Yes Glori Luis, MD  levonorgestrel (LILETTA) 18.6 MCG/DAY IUD IUD by Intrauterine route.   Yes [provider]  lidocaine (XYLOCAINE) 5 % ointment Apply 1 Application topically 4 (four) times daily as needed. 06/07/22  Yes Jaiel Saraceno, Elita Quick, MD  methocarbamol (ROBAXIN) 500 MG tablet Take 500 mg by mouth 3 (three) times daily. 02/04/23  Yes [provider]  montelukast (SINGULAIR) 10 MG tablet TAKE ONE TABLET BY MOUTH EVERY  NIGHT AT BEDTIME 08/13/20  Yes Glori Luis, MD  Multiple Vitamin (MULTI-VITAMINS) TABS Take 1 tablet by mouth daily.   Yes [provider]  ondansetron (ZOFRAN-ODT) 4 MG disintegrating tablet 4 mg every 8 (eight) hours as needed. 04/27/16  Yes [provider]  pregabalin (LYRICA) 50 MG capsule Take 1 capsule (50 mg total) by mouth 2 (two) times daily. 12/08/22  Yes Glori Luis, MD  promethazine (PHENERGAN) 25 MG tablet Take 1 tablet (25 mg total) by mouth every 8 (eight) hours as needed for nausea or vomiting. 04/29/21  Yes McLean-Scocuzza, Pasty Spillers, MD  SUMAtriptan (IMITREX) 50 MG tablet TAKE 1 TABLET BY MOUTH AT ONSET OF MIGRAINE; MAY REPEAT 1 TABLET IN 2 HOURS IF NEEDED. 02/06/23  Yes Glori Luis, MD  Upadacitinib ER (RINVOQ) 15 MG TB24 Take 15 mg by mouth daily.   Yes [provider]  venlafaxine XR (EFFEXOR-XR) 150 MG 24 hr capsule TAKE ONE CAPSULE BY MOUTH DAILY WITH BREAKFAST 05/11/22  Yes Glori Luis, MD  buPROPion [facility]) 100 MG tablet TAKE ONE TABLET BY MOUTH THREE TIMES A DAY 10/03/19 02/23/20  Glori Luis, MD    Allergies: Allergies  Allergen Reactions   Penicillins Rash and Swelling   Oxycodone-Acetaminophen Rash    Pt states she is taking hlaf of 5 mg and doing ok   Tramadol Anxiety and Itching   Codeine Other (See Comments)    Chest pains   Tape Hives    Surgical tape    Review of Systems: Review of Systems  Constitutional:  Negative for chills and fever.  Respiratory:  Negative for shortness of breath.   Cardiovascular:  Negative for chest pain.  Gastrointestinal:  Positive for abdominal pain, blood in stool, constipation and diarrhea. Negative for nausea and vomiting.    Physical Exam BP 106/66   Pulse (!) 58   Temp 98 F (36.7 C)   Ht 5\' 7"  (1.702 m)   Wt 259 lb (117.5 kg)   SpO2 99%   BMI 40.57 kg/m  CONSTITUTIONAL: No acute distress HEENT:  Normocephalic, atraumatic, extraocular motion  intact. RESPIRATORY:  Normal respiratory effort without pathologic use of accessory muscles. CARDIOVASCULAR: Regular rhythm and rate. RECTAL:  External exam reveals enlarged left lateral external hemorrhoid with inflammatory changes and tenderness, but no thrombosis.  The right anterior component also mildly enlarged but without any inflammation.  Posteriorly, the patient has an anal fissure with some evidence of prior bleeding.  Digital rectal exam deferred today due to pain. NEUROLOGIC:  Motor and sensation is grossly normal.  Cranial nerves are grossly intact. PSYCH:  Alert and oriented to person, place and time. Affect is normal.   Assessment and Plan: This is a 48 y.o. female with a posterior anal fissure as well as inflamed external hemorrhoids.  -  Discussed with patient the findings on exam today.  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  Discussed with her that for now we can try conservative measures and she can try using nifedipine compound again to help with the anal fissure as well as Anusol ointment to help with the external hemorrhoid.  Discussed with her that since she did not require Botox injection last year, she potentially may require this again if the therapy does not help. - Discussed with her also that she can do sitz bath's to help soothe the tissues.  Recommend that she continue with a bowel regimen to avoid constipation issues.  She has an appointment with gastroenterology at the end of the month and she will bring up the fact that she has been having constipation and diarrhea alternating.  She also has a procedure with her bariatric surgeon to make sure there are no other issues ongoing from the bariatric standpoint. - Follow-up in 1 month to reassess her progress.  I spent 30 minutes dedicated to the care of this patient on the date of this encounter to include pre-visit review of  records, face-to-face time with the patient discussing diagnosis and management, and any post-visit coordination of care.   Howie Ill, MD [facility]

## 2023-04-03 ENCOUNTER — Other Ambulatory Visit (HOSPITAL_BASED_OUTPATIENT_CLINIC_OR_DEPARTMENT_OTHER): Payer: Self-pay | Admitting: Obstetrics & Gynecology

## 2023-04-03 NOTE — Telephone Encounter (Signed)
Lvm for pt. Informed pt that [place] was sent to [facility] and that Enbrel doesn't have a side effect of fatty liver. Okay to relay message

## 2023-04-06 NOTE — Telephone Encounter (Signed)
 Called and spoke with pt.  She did get voicemail and did not have any questions or concerns.

## 2023-04-23 ENCOUNTER — Encounter: Payer: Self-pay | Admitting: Family Medicine

## 2023-04-24 MED ORDER — PREGABALIN 100 MG PO CAPS
100.0000 mg | ORAL_CAPSULE | Freq: Two times a day (BID) | ORAL | 0 refills | Status: DC
Start: 2023-04-24 — End: 2023-06-13

## 2023-04-30 ENCOUNTER — Encounter: Payer: Self-pay | Admitting: Surgery

## 2023-04-30 ENCOUNTER — Ambulatory Visit (INDEPENDENT_AMBULATORY_CARE_PROVIDER_SITE_OTHER): Payer: BC Managed Care – PPO | Admitting: Surgery

## 2023-04-30 VITALS — BP 142/94 | HR 60 | Temp 98.1°F | Ht 67.0 in | Wt 266.0 lb

## 2023-04-30 DIAGNOSIS — K644 Residual hemorrhoidal skin tags: Secondary | ICD-10-CM | POA: Diagnosis not present

## 2023-04-30 DIAGNOSIS — K602 Anal fissure, unspecified: Secondary | ICD-10-CM | POA: Diagnosis not present

## 2023-04-30 MED ORDER — HYDROCORTISONE (PERIANAL) 2.5 % EX CREA: 1.0000 | TOPICAL_CREAM | Freq: Two times a day (BID) | CUTANEOUS | 0 refills | Status: AC

## 2023-04-30 NOTE — Progress Notes (Signed)
 04/30/2023  History of Present Illness: Jill Hawkins is a 48 y.o. female presenting for follow-up of recurrent posterior anal fissure as well as enlarged external hemorrhoids.  The patient was last seen on 04/02/2023 at which time she was noted to have enlarged hemorrhoids without any inflammatory changes as well as a recurrent posterior fissure as a result of worsening constipation issues.  She has seen her gastroenterologist and is currently pending prior authorization for the use of Trulance for her constipation.  She has also seen her bariatric surgeon to discuss any potential etiologies for her constipation issues.  She also has been having some epigastric discomfort he recommended potentially that an EGD could be done in the future to evaluate for a hiatal hernia.  She has been using the nifedipine ointment which has helped to some degree with her anal fissure.  However given her continuous constipation issues, she is still having perianal pain with bowel movements as well as blood in her stools with the bowel movements.  She also has used the Anusol for her hemorrhoids and currently has stopped using it.  However she still feels that the external components are inflamed and tender.  Past Medical History: Past Medical History:  Diagnosis Date   Anemia    hx iron infusions   Anxiety    Chronic pain    r/t ddd   Depression    Frequent headaches    GERD (gastroesophageal reflux disease)    History of fainting spells of unknown cause    History of hiatal hernia    History of kidney stones    Hypertension    no issues since gastric bypass   Migraine    PONV (postoperative nausea and vomiting)    Rheumatoid arthritis (HCC)    UTI (lower urinary tract infection)      Past Surgical History: Past Surgical History:  Procedure Laterality Date   BOTOX INJECTION N/A 06/07/2022   Procedure: BOTOX INJECTION;  Surgeon: Henrene Dodge, MD;  Location: [facility] ORS;  Service: General;  Laterality: N/A;    CARPAL TUNNEL RELEASE  02/28/2003   CESAREAN SECTION     x 2   CHOLECYSTECTOMY  02/27/2013   GASTRIC BYPASS  04/14/2013   GROWTH PLATE SURGERY  32/95/1884   Right hip   HIATAL HERNIA REPAIR     SPHINCTEROTOMY N/A 06/07/2022   Procedure: SPHINCTEROTOMY, botox chemical;  Surgeon: Henrene Dodge, MD;  Location: [facility] ORS;  Service: General;  Laterality: N/A;    Home Medications: Prior to Admission medications   Medication Sig Start Date End Date Taking? Authorizing Provider  ADDYI 100 MG TABS TAKE ONE TABLET BY MOUTH DAILY 04/04/23  Yes Jerene Bears, MD  ALPRAZolam Prudy Feeler) 1 MG tablet TAKE 1 TABLET(1 MG) BY MOUTH DAILY AS NEEDED FOR ANXIETY 07/19/22  Yes Glori Luis, MD  B Complex Vitamins (VITAMIN-B COMPLEX) TABS Take 1 tablet by mouth daily.   Yes [provider]  cyanocobalamin (,VITAMIN B-12,) 1000 MCG/ML injection Inject 1,000 mcg into the muscle every 7 (seven) days. 05/19/21  Yes [provider]  etanercept (ENBREL SURECLICK) 50 MG/ML injection Inject 50 mLs into the skin once a week. On friday 03/22/20  Yes [provider]  fexofenadine (ALLEGRA) 180 MG tablet TAKE 1 TABLET BY MOUTH DAILY 12/07/22  Yes Glori Luis, MD  folic acid (FOLVITE) 1 MG tablet TAKE 1 TABLET BY MOUTH DAILY 12/07/22  Yes Glori Luis, MD  levonorgestrel (LILETTA) 18.6 MCG/DAY IUD IUD by Intrauterine  route.   Yes [provider]  lidocaine (XYLOCAINE) 5 % ointment Apply 1 Application topically 4 (four) times daily as needed. 06/07/22  Yes Tymia Streb, Elita Quick, MD  methocarbamol (ROBAXIN) 500 MG tablet Take 500 mg by mouth 3 (three) times daily. 02/04/23  Yes [provider]  montelukast (SINGULAIR) 10 MG tablet TAKE ONE TABLET BY MOUTH EVERY NIGHT AT BEDTIME 08/13/20  Yes Glori Luis, MD  Multiple Vitamin (MULTI-VITAMINS) TABS Take 1 tablet by mouth daily.   Yes [provider]  ondansetron (ZOFRAN-ODT) 4 MG disintegrating tablet 4 mg every 8  (eight) hours as needed. 04/27/16  Yes [provider]  pregabalin (LYRICA) 100 MG capsule Take 1 capsule (100 mg total) by mouth 2 (two) times daily. 04/24/23  Yes Glori Luis, MD  promethazine (PHENERGAN) 25 MG tablet Take 1 tablet (25 mg total) by mouth every 8 (eight) hours as needed for nausea or vomiting. 04/29/21  Yes McLean-Scocuzza, Pasty Spillers, MD  SUMAtriptan (IMITREX) 50 MG tablet TAKE 1 TABLET BY MOUTH AT ONSET OF MIGRAINE; MAY REPEAT 1 TABLET IN 2 HOURS IF NEEDED. 02/06/23  Yes Glori Luis, MD  venlafaxine XR (EFFEXOR-XR) 150 MG 24 hr capsule TAKE ONE CAPSULE BY MOUTH DAILY WITH BREAKFAST 05/11/22  Yes Glori Luis, MD  hydrocortisone (ANUSOL-HC) 2.5 % rectal cream Place 1 Application rectally 2 (two) times daily. 04/30/23   Henrene Dodge, MD  buPROPion (WELLBUTRIN) 100 MG tablet TAKE ONE TABLET BY MOUTH THREE TIMES A DAY 10/03/19 02/23/20  Glori Luis, MD    Allergies: Allergies  Allergen Reactions   Penicillins Rash and Swelling   Oxycodone-Acetaminophen Rash    Pt states she is taking hlaf of 5 mg and doing ok   Tramadol Anxiety and Itching   Codeine Other (See Comments)    Chest pains   Tape Hives    Surgical tape    Review of Systems: Review of Systems  Constitutional:  Negative for chills and fever.  Respiratory:  Negative for shortness of breath.   Cardiovascular:  Negative for chest pain.  Gastrointestinal:  Positive for abdominal pain, blood in stool and constipation. Negative for nausea and vomiting.    Physical Exam BP (!) 142/94   Pulse 60   Temp 98.1 F (36.7 C) (Oral)   Ht 5\' 7"  (1.702 m)   Wt 266 lb (120.7 kg)   BMI 41.66 kg/m  CONSTITUTIONAL: No acute distress HEENT:  Normocephalic, atraumatic, extraocular motion intact. RESPIRATORY:  Normal respiratory effort without pathologic use of accessory muscles. CARDIOVASCULAR: Regular rhythm and rate. GI: The abdomen is soft, nondistended, with mild discomfort to palpation at the  umbilicus.  I do not palpate any hernia defects. RECTAL: External exam reveals mildly inflamed external components of the posterior and left lateral components without any thrombosis.  Posteriorly, the patient still has an anal fissure and is stable in size.  Currently there is no bleeding.  Digital rectal exam deferred today. NEUROLOGIC:  Motor and sensation is grossly normal.  Cranial nerves are grossly intact. PSYCH:  Alert and oriented to person, place and time. Affect is normal.   Assessment and Plan: This is a 48 y.o. female with recurrent posterior anal fissure and inflamed external hemorrhoids.  - Discussed with patient that unfortunately it is difficult for her fissure and hemorrhoids to improve if there is continued issues with her constipation.  For now given there has been at least some mild improvement, I recommended that she continue with the nifedipine  ointment.  For now given that there is still some inflammation in the external components of her hemorrhoids, I have also recommended that she continue with Anusol ointment.  We will refill her Anusol today. - Patient is waiting authorization to be able to get Trulance prescription approved so she can start using this for constipation.  If she is able to start this medication, then we can see her back hopefully in a few weeks to see how the medication is helping her with the constipation.  If this starting to help, then I think it be more reasonable to proceed with a neck step in surgery in the form of exam under anesthesia, repeat chemical sphincterotomy with Botox and hemorrhoidectomy.  Discussed with her that most certainly I would want her constipation to be much better before we proceed with surgery as any constipation will make her postoperative recovery even more difficult. - Patient understands this plan and she is willing to proceed.  She will follow-up with me in about 3 weeks to reassess her progress.  Discussed with her that if  the first medication is not working and they are having to try her on other medications, we may have to postpone her visit to make sure that her constipation is under better control first.  I spent 20 minutes dedicated to the care of this patient on the date of this encounter to include pre-visit review of records, face-to-face time with the patient discussing diagnosis and management, and any post-visit coordination of care.   Howie Ill, MD [facility]

## 2023-04-30 NOTE — Patient Instructions (Signed)

## 2023-05-07 ENCOUNTER — Ambulatory Visit: Payer: BC Managed Care – PPO | Admitting: Obstetrics and Gynecology

## 2023-05-09 ENCOUNTER — Encounter: Payer: BC Managed Care – PPO | Admitting: Nurse Practitioner

## 2023-05-09 ENCOUNTER — Ambulatory Visit: Payer: BC Managed Care – PPO | Admitting: Obstetrics and Gynecology

## 2023-05-23 ENCOUNTER — Ambulatory Visit: Admitting: Surgery

## 2023-06-04 ENCOUNTER — Encounter: Payer: BC Managed Care – PPO | Admitting: Family Medicine

## 2023-06-13 ENCOUNTER — Encounter: Payer: Self-pay | Admitting: Nurse Practitioner

## 2023-06-13 ENCOUNTER — Ambulatory Visit: Payer: BC Managed Care – PPO | Admitting: Nurse Practitioner

## 2023-06-13 VITALS — BP 118/74 | HR 65 | Temp 97.9°F | Ht 67.0 in | Wt 267.4 lb

## 2023-06-13 DIAGNOSIS — Z1329 Encounter for screening for other suspected endocrine disorder: Secondary | ICD-10-CM

## 2023-06-13 DIAGNOSIS — F419 Anxiety disorder, unspecified: Secondary | ICD-10-CM | POA: Diagnosis not present

## 2023-06-13 DIAGNOSIS — R6882 Decreased libido: Secondary | ICD-10-CM

## 2023-06-13 DIAGNOSIS — N644 Mastodynia: Secondary | ICD-10-CM

## 2023-06-13 DIAGNOSIS — M797 Fibromyalgia: Secondary | ICD-10-CM

## 2023-06-13 DIAGNOSIS — M0600 Rheumatoid arthritis without rheumatoid factor, unspecified site: Secondary | ICD-10-CM | POA: Diagnosis not present

## 2023-06-13 DIAGNOSIS — F32A Depression, unspecified: Secondary | ICD-10-CM

## 2023-06-13 DIAGNOSIS — R5383 Other fatigue: Secondary | ICD-10-CM | POA: Diagnosis not present

## 2023-06-13 DIAGNOSIS — Z13220 Encounter for screening for lipoid disorders: Secondary | ICD-10-CM | POA: Diagnosis not present

## 2023-06-13 DIAGNOSIS — Z87898 Personal history of other specified conditions: Secondary | ICD-10-CM

## 2023-06-13 MED ORDER — PREGABALIN 100 MG PO CAPS: 100.0000 mg | ORAL_CAPSULE | Freq: Two times a day (BID) | ORAL | 5 refills | Status: DC

## 2023-06-13 MED ORDER — ALPRAZOLAM 1 MG PO TABS: ORAL_TABLET | ORAL | 1 refills | Status: AC

## 2023-06-13 MED ORDER — SERTRALINE HCL 50 MG PO TABS: 50.0000 mg | ORAL_TABLET | Freq: Every day | ORAL | 0 refills | Status: DC

## 2023-06-13 MED ORDER — SCOPOLAMINE 1 MG/3DAYS TD PT72
1.0000 | MEDICATED_PATCH | TRANSDERMAL | 0 refills | Status: AC
Start: 2023-06-13 — End: ?

## 2023-06-13 MED ORDER — PREGABALIN 100 MG PO CAPS: 100.0000 mg | ORAL_CAPSULE | Freq: Two times a day (BID) | ORAL | 1 refills | Status: AC

## 2023-06-13 NOTE — Progress Notes (Signed)
 Jill Burkitt, NP-C Phone: [phone]  Jill Hawkins is a 48 y.o. female who presents today for transfer of care.   Discussed the use of AI scribe software for clinical note transcription with the patient, who gave verbal consent to proceed.  History of Present Illness   Jill Hawkins is a 48 year old female with fibromyalgia and rheumatoid arthritis who presents with fatigue and sleep disturbances and for transfer of care visit.  She has a history of fibromyalgia and rheumatoid arthritis, managed by a rheumatologist. She was previously on gabapentin  but switched back to Lyrica  due to lack of efficacy. She currently takes Lyrica  100 mg twice daily, which she finds effective. She also has a history of right shoulder and scapula pain, with a bulging disc identified on an MRI in January.  She has experienced gastrointestinal issues, including constipation and anal fissures, which began last year. 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 Despite these surgeries, she did not have significant bowel issues until recently. She suspects a medication interaction between Rinvoq and Enbrel may have contributed to her constipation, which has since improved after discontinuing Rinvoq.  She experiences persistent fatigue and sleep disturbances, feeling tired regardless of sleep duration. She occasionally has trouble falling asleep and has tried trazodone  in the past without success. No excessive caffeine intake. She is compliant with B12 injections and vitamin D2 supplementation. She has a history of low iron levels, requiring infusions approximately every two years, with the last round in August. She avoids daily iron supplements due to constipation concerns.  She has a family history of significant health issues, including her mother who was 'a very sick lady' and passed away at an early age. She is concerned about inheriting 'not good genes.' There is also a family history of  mental health issues, including bipolar disorder and schizophrenia.  She experiences low energy and a lack of motivation, which she attributes to stress and mental fatigue. She has a history of depression following her mother's death but does not currently feel depressed. She is concerned about being labeled due to her family history of mental health issues. She has been off Effexor  for some time and is not currently on any mood-stabilizing medications.      Social History   Tobacco Use  Smoking Status Former   Types: Cigarettes   Passive exposure: Past  Smokeless Tobacco Never  Tobacco Comments   Smoker casually in her High School Years    Current Outpatient Medications on File Prior to Visit  Medication Sig Dispense Refill   B Complex Vitamins (VITAMIN-B COMPLEX) TABS Take 1 tablet by mouth daily.     cyanocobalamin (,VITAMIN B-12,) 1000 MCG/ML injection Inject 1,000 mcg into the muscle every 7 (seven) days.     etanercept (ENBREL SURECLICK) 50 MG/ML injection Inject 50 mLs into the skin once a week. On friday     fexofenadine  (ALLEGRA ) 180 MG tablet TAKE 1 TABLET BY MOUTH DAILY 90 tablet 1   folic acid  (FOLVITE ) 1 MG tablet TAKE 1 TABLET BY MOUTH DAILY 90 tablet 0   hydrocortisone  (ANUSOL -HC) 2.5 % rectal cream Place 1 Application rectally 2 (two) times daily. 30 g 0   levonorgestrel (LILETTA) 18.6 MCG/DAY IUD IUD by Intrauterine route.     lidocaine  (XYLOCAINE ) 5 % ointment Apply 1 Application topically 4 (four) times daily as needed. 35 g 0   methocarbamol  (ROBAXIN ) 500 MG tablet Take 500 mg by mouth 3 (three) times daily.  montelukast  (SINGULAIR ) 10 MG tablet TAKE ONE TABLET BY MOUTH EVERY NIGHT AT BEDTIME 30 tablet 2   Multiple Vitamin (MULTI-VITAMINS) TABS Take 1 tablet by mouth daily.     ondansetron  (ZOFRAN -ODT) 4 MG disintegrating tablet 4 mg every 8 (eight) hours as needed.     promethazine  (PHENERGAN ) 25 MG tablet Take 1 tablet (25 mg total) by mouth every 8 (eight)  hours as needed for nausea or vomiting. 40 tablet 0   SUMAtriptan  (IMITREX ) 50 MG tablet TAKE 1 TABLET BY MOUTH AT ONSET OF MIGRAINE; MAY REPEAT 1 TABLET IN 2 HOURS IF NEEDED. 9 tablet 0   [DISCONTINUED] buPROPion  (WELLBUTRIN ) 100 MG tablet TAKE ONE TABLET BY MOUTH THREE TIMES A DAY 90 tablet 0   No current facility-administered medications on file prior to visit.     ROS see history of present illness  Objective  Physical Exam Vitals:   06/13/23 1352  BP: 118/74  Pulse: 65  Temp: 97.9 F (36.6 C)  SpO2: 99%    BP Readings from Last 3 Encounters:  06/25/23 135/84  06/13/23 118/74  04/30/23 (!) 142/94   Wt Readings from Last 3 Encounters:  06/25/23 254 lb (115.2 kg)  06/13/23 267 lb 6.4 oz (121.3 kg)  04/30/23 266 lb (120.7 kg)    Physical Exam Exam conducted with a chaperone present Jackline Maser, CMA).  Constitutional:      General: She is not in acute distress.    Appearance: Normal appearance.  HENT:     Head: Normocephalic.  Cardiovascular:     Rate and Rhythm: Normal rate and regular rhythm.     Heart sounds: Normal heart sounds.  Pulmonary:     Effort: Pulmonary effort is normal.     Breath sounds: Normal breath sounds.  Chest:  Breasts:    Right: Mass and tenderness present. No nipple discharge or skin change.     Left: Normal.    Skin:    General: Skin is warm and dry.  Neurological:     General: No focal deficit present.     Mental Status: She is alert.  Psychiatric:        Mood and Affect: Mood normal.        Behavior: Behavior normal.      Assessment/Plan: Please see individual problem list.  Anxiety and depression Assessment & Plan: Chronic issue. PHQ- 10 and GAD- 5 today. Discussed SSRIs and that her mental health could be contributing to some of her fatigue/symptoms. We will start Sertraline  50 mg daily at bedtime. She will continue Xanax  as needed. Counseled patient on common side effects. Encouraged to contact if worsening  symptoms, unusual behavior changes or suicidal thoughts occur. A follow-up in 6 weeks will assess her response to sertraline .   Orders: -     ALPRAZolam ; TAKE 1 TABLET(1 MG) BY MOUTH DAILY AS NEEDED FOR ANXIETY  Dispense: 15 tablet; Refill: 1 -     Sertraline  HCl; Take 1 tablet (50 mg total) by mouth at bedtime.  Dispense: 90 tablet; Refill: 0  Fatigue, unspecified type Assessment & Plan: Chronic fatigue persists. A previous sleep study was normal, and labs have been reviewed. Additional labs will be ordered to rule out thyroid  dysfunction, diabetes, and other potential causes. Mental health issues may contribute to her fatigue, and sertraline  was discussed as a treatment option.   Orders: -     Follicle stimulating hormone -     Estradiol  -     Magnesium  Seronegative rheumatoid arthritis (  HCC) Assessment & Plan: Her rheumatoid arthritis is managed by rheumatology with Enbrel. Rinvoq was discontinued due to potential interactions. Continue current management. Follow up as scheduled.    Fibromyalgia Assessment & Plan: Her chronic fibromyalgia is effectively managed with Lyrica . Gabapentin  was previously tried without success. Lyrica  100 mg will be refilled for 90 days, taken twice daily.  Orders: -     Pregabalin ; Take 1 capsule (100 mg total) by mouth 2 (two) times daily.  Dispense: 180 capsule; Refill: 1  Decreased sex drive Assessment & Plan: Low libido is managed with Addyi , and she reports improvement. Continue. Lyrica  was resumed for fibromyalgia despite concerns about libido.   Breast pain, right Assessment & Plan: Breast exam performed today. Tenderness noted in right breast with mass like area palpated in upper outer quadrant. We will send diagnostic mammogram and US  orders for further evaluation.   Orders: -     MM 3D DIAGNOSTIC MAMMOGRAM UNILATERAL RIGHT BREAST; Future -     US  BREAST COMPLETE UNI RIGHT INC AXILLA; Future  Hx of motion sickness -     Scopolamine ;  Place 1 patch (1.5 mg total) onto the skin every 3 (three) days.  Dispense: 10 patch; Refill: 0  Lipid screening -     Lipid panel  Thyroid  disorder screen -     TSH    Return in about 6 weeks (around 07/25/2023) for Follow up.   Jill Burkitt, NP-C [facility]

## 2023-06-14 ENCOUNTER — Encounter: Payer: Self-pay | Admitting: Nurse Practitioner

## 2023-06-14 LAB — LIPID PANEL
Cholesterol: 129 mg/dL (ref 0–200)
HDL: 69.1 mg/dL (ref >39.00)
LDL Cholesterol: 53 mg/dL (ref 0–99)
NonHDL: 60.35
Total CHOL/HDL Ratio: 2
Triglycerides: 39 mg/dL (ref 0.0–149.0)
VLDL: 7.8 mg/dL (ref 0.0–40.0)

## 2023-06-14 LAB — MAGNESIUM: Magnesium: 1.7 mg/dL (ref 1.5–2.5)

## 2023-06-14 LAB — ESTRADIOL: Estradiol: 666 pg/mL — ABNORMAL HIGH

## 2023-06-14 LAB — FOLLICLE STIMULATING HORMONE: FSH: 3.8 m[IU]/mL

## 2023-06-14 LAB — TSH: TSH: 0.94 u[IU]/mL (ref 0.35–5.50)

## 2023-06-21 ENCOUNTER — Ambulatory Visit (HOSPITAL_BASED_OUTPATIENT_CLINIC_OR_DEPARTMENT_OTHER): Admitting: Obstetrics & Gynecology

## 2023-06-21 ENCOUNTER — Other Ambulatory Visit (HOSPITAL_BASED_OUTPATIENT_CLINIC_OR_DEPARTMENT_OTHER): Payer: Self-pay | Admitting: *Deleted

## 2023-06-21 MED ORDER — ADDYI 100 MG PO TABS: 1.0000 | ORAL_TABLET | Freq: Every day | ORAL | 2 refills | Status: DC

## 2023-06-21 NOTE — Progress Notes (Signed)
 Pt requests refill on addyi . Rx sent

## 2023-06-22 ENCOUNTER — Telehealth: Payer: Self-pay | Admitting: Nurse Practitioner

## 2023-06-22 NOTE — Telephone Encounter (Signed)
 Faxed form to [facility] at a secondary fax number [phone] form was already faxed to [phone]

## 2023-06-22 NOTE — Telephone Encounter (Signed)
 Copied from CRM [id number]. Topic: General - Other >> Jun 22, 2023  1:37 PM Jenice Mitts wrote: Reason for CRM: Jill Hawkins calling from [facility]. She is calling about a referral that was sent over for an ultrasound. She stated in order to do the ultrasound They need an order put in for bilateral diagnostic mammogram  need bilateral diagnostic mammogram order

## 2023-06-25 ENCOUNTER — Encounter (HOSPITAL_BASED_OUTPATIENT_CLINIC_OR_DEPARTMENT_OTHER): Payer: Self-pay | Admitting: Obstetrics & Gynecology

## 2023-06-25 ENCOUNTER — Ambulatory Visit (HOSPITAL_BASED_OUTPATIENT_CLINIC_OR_DEPARTMENT_OTHER): Admitting: Obstetrics & Gynecology

## 2023-06-25 ENCOUNTER — Telehealth: Payer: Self-pay

## 2023-06-25 VITALS — BP 135/84 | HR 68 | Ht 67.0 in | Wt 254.0 lb

## 2023-06-25 DIAGNOSIS — E349 Endocrine disorder, unspecified: Secondary | ICD-10-CM

## 2023-06-25 NOTE — Progress Notes (Signed)
 GYNECOLOGY  VISIT  CC:   abnormal estradiol  level  HPI: 48 y.o. G71P2002 Married White or Caucasian female here for concerns about elevated estradiol  level.  Has been having some increased issues with fatigue and sleep disturbance.  She has a history of fibromyalgia and rheumatoid arthritis.  She is on Lyrica .  On April 16, she saw her primary care provider, Karry Paganini, NP, who ordered several lab test for the patient including estradiol  level and FSH.  Her estradiol  level was 666.  Her FSH was 3.8.  Due to this significant elevation of her estradiol  she decided to come to me for my opinion.  Patient does not typically have any vaginal bleeding as she has a progesterone  IUD.  Last week she had some spotting for four days.     Past Medical History:  Diagnosis Date   Anemia    hx iron infusions   Anxiety    Chronic pain    r/t ddd   Depression    Frequent headaches    GERD (gastroesophageal reflux disease)    History of fainting spells of unknown cause    History of hiatal hernia    History of kidney stones    Hypertension    no issues since gastric bypass   Migraine    PONV (postoperative nausea and vomiting)    Rheumatoid arthritis (HCC)    UTI (lower urinary tract infection)     MEDS:   Current Outpatient Medications on File Prior to Visit  Medication Sig Dispense Refill   ALPRAZolam  (XANAX ) 1 MG tablet TAKE 1 TABLET(1 MG) BY MOUTH DAILY AS NEEDED FOR ANXIETY 15 tablet 1   B Complex Vitamins (VITAMIN-B COMPLEX) TABS Take 1 tablet by mouth daily.     cyanocobalamin (,VITAMIN B-12,) 1000 MCG/ML injection Inject 1,000 mcg into the muscle every 7 (seven) days.     etanercept (ENBREL SURECLICK) 50 MG/ML injection Inject 50 mLs into the skin once a week. On friday     fexofenadine  (ALLEGRA ) 180 MG tablet TAKE 1 TABLET BY MOUTH DAILY 90 tablet 1   Flibanserin  (ADDYI ) 100 MG TABS Take 1 tablet (100 mg total) by mouth daily. 30 tablet 2   folic acid  (FOLVITE ) 1 MG tablet TAKE 1  TABLET BY MOUTH DAILY 90 tablet 0   hydrocortisone  (ANUSOL -HC) 2.5 % rectal cream Place 1 Application rectally 2 (two) times daily. 30 g 0   levonorgestrel (LILETTA) 18.6 MCG/DAY IUD IUD by Intrauterine route.     lidocaine  (XYLOCAINE ) 5 % ointment Apply 1 Application topically 4 (four) times daily as needed. 35 g 0   methocarbamol  (ROBAXIN ) 500 MG tablet Take 500 mg by mouth 3 (three) times daily.     montelukast  (SINGULAIR ) 10 MG tablet TAKE ONE TABLET BY MOUTH EVERY NIGHT AT BEDTIME 30 tablet 2   Multiple Vitamin (MULTI-VITAMINS) TABS Take 1 tablet by mouth daily.     ondansetron  (ZOFRAN -ODT) 4 MG disintegrating tablet 4 mg every 8 (eight) hours as needed.     pregabalin  (LYRICA ) 100 MG capsule Take 1 capsule (100 mg total) by mouth 2 (two) times daily. 180 capsule 1   promethazine  (PHENERGAN ) 25 MG tablet Take 1 tablet (25 mg total) by mouth every 8 (eight) hours as needed for nausea or vomiting. 40 tablet 0   scopolamine  (TRANSDERM-SCOP) 1 MG/3DAYS Place 1 patch (1.5 mg total) onto the skin every 3 (three) days. 10 patch 0   sertraline  (ZOLOFT ) 50 MG tablet Take 1 tablet (50 mg total) by  mouth at bedtime. 90 tablet 0   SUMAtriptan  (IMITREX ) 50 MG tablet TAKE 1 TABLET BY MOUTH AT ONSET OF MIGRAINE; MAY REPEAT 1 TABLET IN 2 HOURS IF NEEDED. 9 tablet 0   [DISCONTINUED] buPROPion  (WELLBUTRIN ) 100 MG tablet TAKE ONE TABLET BY MOUTH THREE TIMES A DAY 90 tablet 0   No current facility-administered medications on file prior to visit.    ALLERGIES: Penicillins, Oxycodone -acetaminophen , Tramadol, Codeine, and Tape  SH:  married, non smoker  Review of Systems  Constitutional: Negative.   Genitourinary: Negative.     PHYSICAL EXAMINATION:    BP 135/84 (BP Location: Right Arm, Patient Position: Sitting, Cuff Size: Normal)   Pulse 68   Ht 5\' 7"  (1.702 m)   Wt 254 lb (115.2 kg)   BMI 39.78 kg/m     Physical Exam Constitutional:      Appearance: Normal appearance.  Neurological:      General: No focal deficit present.     Mental Status: She is alert.  Psychiatric:        Mood and Affect: Mood normal.      Assessment/Plan: 1. Abnormality of hormone (Primary) -We discussed findings with her last lab work.  Given the typical functional work she does not occur with estradiol , I recommend repeating it today.  I would like to also check a testosterone  and progesterone  level.  If the estradiol  level continues to be elevated we will proceed with ultrasound.  If it is normal, she and I discussed repeating this again in a month to 2 months just to ensure that it remains low and that the first lab test was an outlier.  Patient is comfortable with plan. - Last Pap smear 10/23/2022 within normal limits - Last screening mammogram was 10/06/2022

## 2023-06-25 NOTE — Telephone Encounter (Signed)
 Copied from CRM [id number]. Topic: Clinical - Request for Lab/Test Order >> Jun 25, 2023  2:45 PM Elle L wrote: Reason for CRM: The patient states that [facility] center received the order for the ultrasound but not the mammogram.

## 2023-06-26 LAB — ESTRADIOL: Estradiol: 84.5 pg/mL

## 2023-06-26 NOTE — Telephone Encounter (Signed)
 Orders faxed hard copy and e faxed

## 2023-06-27 ENCOUNTER — Encounter: Payer: Self-pay | Admitting: Nurse Practitioner

## 2023-06-27 LAB — PROGESTERONE: Progesterone: 0.1 ng/mL

## 2023-06-27 LAB — TESTOSTERONE, TOTAL, LC/MS/MS: Testosterone, total: 12.9 ng/dL

## 2023-06-27 NOTE — Assessment & Plan Note (Signed)
 Her chronic fibromyalgia is effectively managed with Lyrica . Gabapentin  was previously tried without success. Lyrica  100 mg will be refilled for 90 days, taken twice daily.

## 2023-06-27 NOTE — Assessment & Plan Note (Addendum)
 Chronic issue. PHQ- 10 and GAD- 5 today. Discussed SSRIs and that her mental health could be contributing to some of her fatigue/symptoms. We will start Sertraline  50 mg daily at bedtime. She will continue Xanax  as needed. Counseled patient on common side effects. Encouraged to contact if worsening symptoms, unusual behavior changes or suicidal thoughts occur. A follow-up in 6 weeks will assess her response to sertraline .

## 2023-06-27 NOTE — Assessment & Plan Note (Signed)
 Low libido is managed with Addyi , and she reports improvement. Continue. Lyrica  was resumed for fibromyalgia despite concerns about libido.

## 2023-06-27 NOTE — Assessment & Plan Note (Signed)
 Her rheumatoid arthritis is managed by rheumatology with Enbrel. Rinvoq was discontinued due to potential interactions. Continue current management. Follow up as scheduled.

## 2023-06-27 NOTE — Assessment & Plan Note (Addendum)
 Chronic fatigue persists. A previous sleep study was normal, and labs have been reviewed. Additional labs will be ordered to rule out thyroid  dysfunction, diabetes, and other potential causes. Mental health issues may contribute to her fatigue, and sertraline  was discussed as a treatment option.

## 2023-06-27 NOTE — Assessment & Plan Note (Signed)
 Breast exam performed today. Tenderness noted in right breast with mass like area palpated in upper outer quadrant. We will send diagnostic mammogram and US  orders for further evaluation.

## 2023-06-28 ENCOUNTER — Other Ambulatory Visit: Payer: Self-pay | Admitting: Nurse Practitioner

## 2023-06-28 ENCOUNTER — Encounter (HOSPITAL_BASED_OUTPATIENT_CLINIC_OR_DEPARTMENT_OTHER): Payer: Self-pay | Admitting: Obstetrics & Gynecology

## 2023-06-28 ENCOUNTER — Telehealth: Payer: Self-pay

## 2023-06-28 DIAGNOSIS — N644 Mastodynia: Secondary | ICD-10-CM

## 2023-06-28 NOTE — Telephone Encounter (Signed)
 Fax received from [facility] stating they need a bilateral diagnostic mammo instead of unilateral

## 2023-06-28 NOTE — Telephone Encounter (Signed)
 Order has been routed via e fax to [facility] at [phone]

## 2023-07-02 NOTE — Telephone Encounter (Addendum)
 The patient came into the office today stating that [facility] had not received the orders for her uni right breast ultrasound. However, they did receive the order for the bilateral breast mammogram. The patient asked for both the orders to be re-faxed to [facility]. She stated that the orders were supposed to have been left for her to pick up. She was very upset stating that she was experiencing more pain in her right breast and she wanted the test to find out what was going on and she was wondering why it was taking so long. Thersia Flax and I spoke with the patient, I gave the patient copies of her orders. Dr.Tullo signed and I faxed the orders to [phone]. The patient will follow up with [facility] to see if they received the orders.

## 2023-07-27 ENCOUNTER — Ambulatory Visit: Admitting: Nurse Practitioner

## 2023-07-27 LAB — HM MAMMOGRAPHY

## 2023-08-01 ENCOUNTER — Ambulatory Visit (INDEPENDENT_AMBULATORY_CARE_PROVIDER_SITE_OTHER): Admitting: Nurse Practitioner

## 2023-08-01 ENCOUNTER — Encounter: Payer: Self-pay | Admitting: Nurse Practitioner

## 2023-08-01 VITALS — BP 120/68 | HR 62 | Temp 98.1°F | Ht 67.0 in | Wt 245.8 lb

## 2023-08-01 DIAGNOSIS — F419 Anxiety disorder, unspecified: Secondary | ICD-10-CM | POA: Diagnosis not present

## 2023-08-01 DIAGNOSIS — F32A Depression, unspecified: Secondary | ICD-10-CM

## 2023-08-01 DIAGNOSIS — H938X1 Other specified disorders of right ear: Secondary | ICD-10-CM

## 2023-08-01 DIAGNOSIS — J309 Allergic rhinitis, unspecified: Secondary | ICD-10-CM

## 2023-08-01 DIAGNOSIS — B001 Herpesviral vesicular dermatitis: Secondary | ICD-10-CM

## 2023-08-01 DIAGNOSIS — Z91030 Bee allergy status: Secondary | ICD-10-CM

## 2023-08-01 DIAGNOSIS — E349 Endocrine disorder, unspecified: Secondary | ICD-10-CM

## 2023-08-01 DIAGNOSIS — R5382 Chronic fatigue, unspecified: Secondary | ICD-10-CM | POA: Diagnosis not present

## 2023-08-01 DIAGNOSIS — M0600 Rheumatoid arthritis without rheumatoid factor, unspecified site: Secondary | ICD-10-CM | POA: Diagnosis not present

## 2023-08-01 MED ORDER — EPINEPHRINE 0.3 MG/0.3ML IJ SOAJ: 0.3000 mg | INTRAMUSCULAR | 0 refills | Status: AC | PRN

## 2023-08-01 MED ORDER — FEXOFENADINE-PSEUDOEPHED ER 180-240 MG PO TB24: 1.0000 | ORAL_TABLET | Freq: Every day | ORAL | 0 refills | Status: DC

## 2023-08-01 MED ORDER — FLUTICASONE PROPIONATE 50 MCG/ACT NA SUSP: 2.0000 | Freq: Every day | NASAL | 1 refills | Status: AC

## 2023-08-01 MED ORDER — VALACYCLOVIR HCL 1 G PO TABS: 1000.0000 mg | ORAL_TABLET | Freq: Two times a day (BID) | ORAL | 2 refills | Status: AC

## 2023-08-01 MED ORDER — LEVOCETIRIZINE DIHYDROCHLORIDE 5 MG PO TABS
5.0000 mg | ORAL_TABLET | Freq: Every evening | ORAL | 11 refills | Status: AC
Start: 2023-08-01 — End: ?

## 2023-08-01 MED ORDER — SERTRALINE HCL 50 MG PO TABS: 50.0000 mg | ORAL_TABLET | Freq: Every day | ORAL | 2 refills | Status: AC

## 2023-08-01 NOTE — Progress Notes (Signed)
 Bluford Burkitt, NP-C Phone: [phone]  Jill Hawkins is a 48 y.o. female who presents today for follow up.   Discussed the use of AI scribe software for clinical note transcription with the patient, who gave verbal consent to proceed.  History of Present Illness   Jill Hawkins is a 48 year old female with rheumatoid arthritis and chronic fatigue who presents for follow-up on mood and fatigue management.  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 This uncertainty about her future employment has contributed to her anxiety. She has been on Zoloft  for approximately six weeks, noting some improvement in sleep but not in energy levels.  She attributes some of her fatigue to rheumatoid arthritis and is attempting dietary changes to improve her condition. She has been on Enbrel for rheumatoid arthritis for several years and previously tried Rinvoq, but stopped taking Rinvoq after her rheumatologist advised against using both medications together. She experiences chronic fatigue, described as feeling 'really tired and no energy' after work, impacting her ability to engage in activities like walking or doing household chores during the week. She takes Lyrica  in the morning and late afternoon, which she finds more effective than gabapentin , though it may contribute to her fatigue.  She recently returned from a cruise where she developed a respiratory illness characterized by congestion, loss of voice, and a persistent cough. She was treated with a Z-Pak and prednisone , which improved her symptoms, though she continues to experience congestion and occasional blood-tinged nasal discharge. She has a history of epistaxis and avoids Flonase  due to this. She takes Allegra  and Zyrtec for allergies.  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 She uses Abreva for treatment. Stress and sun exposure may have contributed to the severity of this outbreak.  She recalls a previous incident where she was stung multiple times by yellow jackets, resulting in throat scratchiness and a visit to the emergency room. She is concerned about potential allergic reactions in the future.      Social History   Tobacco Use  Smoking Status Former   Types: Cigarettes   Passive exposure: Past  Smokeless Tobacco Never  Tobacco Comments   Smoker casually in her High School Years    Current Outpatient Medications on File Prior to Visit  Medication Sig Dispense Refill   ALPRAZolam  (XANAX ) 1 MG tablet TAKE 1 TABLET(1 MG) BY MOUTH DAILY AS NEEDED FOR ANXIETY 15 tablet 1   B Complex Vitamins (VITAMIN-B COMPLEX) TABS Take 1 tablet by mouth daily.     cyanocobalamin (,VITAMIN B-12,) 1000 MCG/ML injection Inject 1,000 mcg into the muscle every 7 (seven) days.     etanercept (ENBREL SURECLICK) 50 MG/ML injection Inject 50 mLs into the skin once a week. On friday     fexofenadine  (ALLEGRA ) 180 MG tablet TAKE 1 TABLET BY MOUTH DAILY 90 tablet 1   Flibanserin  (ADDYI ) 100 MG TABS Take 1 tablet (100 mg total) by mouth daily. 30 tablet 2   folic acid  (FOLVITE ) 1 MG tablet TAKE 1 TABLET BY MOUTH DAILY 90 tablet 0   hydrocortisone  (ANUSOL -HC) 2.5 % rectal cream Place 1 Application rectally 2 (two) times daily. 30 g 0   levonorgestrel (LILETTA) 18.6 MCG/DAY IUD IUD by Intrauterine route.     lidocaine  (XYLOCAINE ) 5 % ointment Apply 1 Application topically 4 (four) times daily as needed.  35 g 0   methocarbamol  (ROBAXIN ) 500 MG tablet Take 500 mg by mouth 3 (three) times daily.     montelukast  (SINGULAIR ) 10 MG tablet TAKE ONE TABLET BY MOUTH EVERY NIGHT AT BEDTIME 30 tablet 2   Multiple Vitamin (MULTI-VITAMINS) TABS Take 1 tablet by mouth daily.     ondansetron  (ZOFRAN -ODT) 4 MG disintegrating tablet 4 mg every 8 (eight) hours as needed.     pregabalin   (LYRICA ) 100 MG capsule Take 1 capsule (100 mg total) by mouth 2 (two) times daily. 180 capsule 1   promethazine  (PHENERGAN ) 25 MG tablet Take 1 tablet (25 mg total) by mouth every 8 (eight) hours as needed for nausea or vomiting. 40 tablet 0   scopolamine  (TRANSDERM-SCOP) 1 MG/3DAYS Place 1 patch (1.5 mg total) onto the skin every 3 (three) days. 10 patch 0   SUMAtriptan  (IMITREX ) 50 MG tablet TAKE 1 TABLET BY MOUTH AT ONSET OF MIGRAINE; MAY REPEAT 1 TABLET IN 2 HOURS IF NEEDED. 9 tablet 0   [DISCONTINUED] buPROPion  (WELLBUTRIN ) 100 MG tablet TAKE ONE TABLET BY MOUTH THREE TIMES A DAY 90 tablet 0   No current facility-administered medications on file prior to visit.     ROS see history of present illness  Objective  Physical Exam Vitals:   08/01/23 1607  BP: 120/68  Pulse: 62  Temp: 98.1 F (36.7 C)    BP Readings from Last 3 Encounters:  08/01/23 120/68  06/25/23 135/84  06/13/23 118/74   Wt Readings from Last 3 Encounters:  08/01/23 245 lb 12.8 oz (111.5 kg)  06/25/23 254 lb (115.2 kg)  06/13/23 267 lb 6.4 oz (121.3 kg)    Physical Exam Constitutional:      General: She is not in acute distress.    Appearance: Normal appearance.  HENT:     Head: Normocephalic.     Right Ear: Tympanic membrane normal.     Left Ear: Tympanic membrane normal.     Nose: Nose normal.     Mouth/Throat:     Mouth: Mucous membranes are moist.     Pharynx: Oropharynx is clear.   Eyes:     Conjunctiva/sclera: Conjunctivae normal.     Pupils: Pupils are equal, round, and reactive to light.    Cardiovascular:     Rate and Rhythm: Normal rate and regular rhythm.     Heart sounds: Normal heart sounds.  Pulmonary:     Effort: Pulmonary effort is normal.     Breath sounds: Normal breath sounds.  Abdominal:     General: Bowel sounds are normal.  Lymphadenopathy:     Cervical: No cervical adenopathy.   Skin:    General: Skin is warm and dry.   Neurological:     General: No focal  deficit present.     Mental Status: She is alert.   Psychiatric:        Mood and Affect: Mood normal.        Behavior: Behavior normal.      Assessment/Plan: Please see individual problem list.  Chronic fatigue Assessment & Plan: Chronic fatigue is likely multifactorial, involving RA, medication side effects, and stress. Lyrica  is effective for pain but may contribute to fatigue. Check B12 and vitamin D  levels and continue Lyrica  with the current dosing schedule.  Orders: -     Vitamin B12 -     VITAMIN D  25 Hydroxy (Vit-D Deficiency, Fractures)  Seronegative rheumatoid arthritis (HCC) Assessment & Plan: Her RA is managed with Enbrel, but  chronic pain and fatigue persist, possibly related to RA. Continue Enbrel for RA management.   Anxiety and depression Assessment & Plan: She experiences increased anxiety and stress due to employment uncertainty. Zoloft  has improved her sleep but not her energy levels. Continue Zoloft  50 mg daily and encourage lifestyle modifications to help manage stress.  Orders: -     Sertraline  HCl; Take 1 tablet (50 mg total) by mouth at bedtime.  Dispense: 90 tablet; Refill: 2  Allergic rhinitis, unspecified seasonality, unspecified trigger -     Levocetirizine Dihydrochloride ; Take 1 tablet (5 mg total) by mouth every evening.  Dispense: 30 tablet; Refill: 11 -     Fluticasone  Propionate; Place 2 sprays into both nostrils daily.  Dispense: 16 mL; Refill: 1  Ear congestion, right Assessment & Plan: URI symptoms have improved with Z-Pak and prednisone , but congestion and ear fullness persist. Prescribe Flonase  for nasal congestion for up to two weeks, switch from Zyrtec to Xyzal  for antihistamine management, and prescribe Allegra  D for additional decongestant effect.  Orders: -     Fluticasone  Propionate; Place 2 sprays into both nostrils daily.  Dispense: 16 mL; Refill: 1 -     Fexofenadine -Pseudoephed ER; Take 1 tablet by mouth daily.  Dispense: 15  tablet; Refill: 0  Yellow jacket sting allergy Assessment & Plan: She had a significant reaction to yellow jacket stings, raising concern for future severe reactions. Prescribe an EpiPen  for emergency use in case of future stings.  Orders: -     EPINEPHrine ; Inject 0.3 mg into the muscle as needed for anaphylaxis.  Dispense: 2 each; Refill: 0  Herpes labialis Assessment & Plan: She had a severe outbreak following illness and sun exposure. Prescribe Valtrex  to be taken at the onset of symptoms for outbreak prevention.  Orders: -     valACYclovir  HCl; Take 1 tablet (1,000 mg total) by mouth 2 (two) times daily. X 1 day. Start ASAP after symptom onset.  Dispense: 20 tablet; Refill: 2  Abnormality of hormone -     Estradiol  -     Specimen status report     Return in about 3 months (around 11/01/2023) for Follow up.   Bluford Burkitt, NP-C [facility]

## 2023-08-02 ENCOUNTER — Ambulatory Visit (HOSPITAL_BASED_OUTPATIENT_CLINIC_OR_DEPARTMENT_OTHER): Payer: Self-pay | Admitting: Obstetrics & Gynecology

## 2023-08-02 LAB — VITAMIN D 25 HYDROXY (VIT D DEFICIENCY, FRACTURES): VITD: 30.66 ng/mL (ref 30.00–100.00)

## 2023-08-02 LAB — VITAMIN B12: Vitamin B-12: 280 pg/mL (ref 211–911)

## 2023-08-02 LAB — SPECIMEN STATUS REPORT

## 2023-08-02 LAB — ESTRADIOL: Estradiol: 106 pg/mL

## 2023-08-02 MED ORDER — FEXOFENADINE-PSEUDOEPHED ER 180-240 MG PO TB24: 1.0000 | ORAL_TABLET | Freq: Every day | ORAL | 0 refills | Status: DC

## 2023-08-14 ENCOUNTER — Encounter: Payer: Self-pay | Admitting: Nurse Practitioner

## 2023-08-14 DIAGNOSIS — Z91030 Bee allergy status: Secondary | ICD-10-CM | POA: Insufficient documentation

## 2023-08-14 DIAGNOSIS — H938X1 Other specified disorders of right ear: Secondary | ICD-10-CM | POA: Insufficient documentation

## 2023-08-14 DIAGNOSIS — B001 Herpesviral vesicular dermatitis: Secondary | ICD-10-CM | POA: Insufficient documentation

## 2023-08-14 NOTE — Assessment & Plan Note (Signed)
 Chronic fatigue is likely multifactorial, involving RA, medication side effects, and stress. Lyrica  is effective for pain but may contribute to fatigue. Check B12 and vitamin D  levels and continue Lyrica  with the current dosing schedule.

## 2023-08-14 NOTE — Assessment & Plan Note (Signed)
 She had a significant reaction to yellow jacket stings, raising concern for future severe reactions. Prescribe an EpiPen  for emergency use in case of future stings.

## 2023-08-14 NOTE — Assessment & Plan Note (Signed)
 She experiences increased anxiety and stress due to employment uncertainty. Zoloft  has improved her sleep but not her energy levels. Continue Zoloft  50 mg daily and encourage lifestyle modifications to help manage stress.

## 2023-08-14 NOTE — Assessment & Plan Note (Signed)
 She had a severe outbreak following illness and sun exposure. Prescribe Valtrex  to be taken at the onset of symptoms for outbreak prevention.

## 2023-08-14 NOTE — Assessment & Plan Note (Signed)
 Her RA is managed with Enbrel, but chronic pain and fatigue persist, possibly related to RA. Continue Enbrel for RA management.

## 2023-08-14 NOTE — Assessment & Plan Note (Signed)
 URI symptoms have improved with Z-Pak and prednisone , but congestion and ear fullness persist. Prescribe Flonase  for nasal congestion for up to two weeks, switch from Zyrtec to Xyzal  for antihistamine management, and prescribe Allegra  D for additional decongestant effect.

## 2023-08-27 ENCOUNTER — Ambulatory Visit: Admitting: Student in an Organized Health Care Education/Training Program

## 2023-09-04 ENCOUNTER — Other Ambulatory Visit (HOSPITAL_COMMUNITY): Payer: Self-pay

## 2023-09-04 ENCOUNTER — Telehealth: Payer: Self-pay

## 2023-09-04 NOTE — Telephone Encounter (Signed)
 Pharmacy Patient Advocate Encounter  Received notification from [employer] that Prior Authorization for Addyi  100MG  tablets has been APPROVED from 09/04/23 to 03/06/24   PA #/Case ID/Reference #: [id number]

## 2023-09-04 NOTE — Telephone Encounter (Signed)
 Pharmacy Patient Advocate Encounter   Received notification from Onbase that prior authorization for Addyi  100MG  tablets is due for renewal.   Insurance verification completed.   The patient is insured through [facility].  Action: PA required; PA submitted to above mentioned insurance via CoverMyMeds Key/confirmation #/EOC [facility] Status is pending

## 2023-09-13 ENCOUNTER — Other Ambulatory Visit (HOSPITAL_BASED_OUTPATIENT_CLINIC_OR_DEPARTMENT_OTHER): Payer: Self-pay | Admitting: Obstetrics & Gynecology

## 2023-11-01 ENCOUNTER — Ambulatory Visit: Admitting: Nurse Practitioner

## 2023-11-01 VITALS — BP 110/62 | HR 71 | Temp 98.1°F | Ht 67.0 in | Wt 228.8 lb

## 2023-11-01 DIAGNOSIS — H938X1 Other specified disorders of right ear: Secondary | ICD-10-CM | POA: Diagnosis not present

## 2023-11-01 DIAGNOSIS — F419 Anxiety disorder, unspecified: Secondary | ICD-10-CM

## 2023-11-01 DIAGNOSIS — Z111 Encounter for screening for respiratory tuberculosis: Secondary | ICD-10-CM | POA: Diagnosis not present

## 2023-11-01 DIAGNOSIS — Z23 Encounter for immunization: Secondary | ICD-10-CM | POA: Diagnosis not present

## 2023-11-01 DIAGNOSIS — M8580 Other specified disorders of bone density and structure, unspecified site: Secondary | ICD-10-CM

## 2023-11-01 DIAGNOSIS — M0600 Rheumatoid arthritis without rheumatoid factor, unspecified site: Secondary | ICD-10-CM | POA: Diagnosis not present

## 2023-11-01 DIAGNOSIS — F32A Depression, unspecified: Secondary | ICD-10-CM

## 2023-11-01 DIAGNOSIS — E669 Obesity, unspecified: Secondary | ICD-10-CM

## 2023-11-01 DIAGNOSIS — M797 Fibromyalgia: Secondary | ICD-10-CM

## 2023-11-01 MED ORDER — NEOMYCIN-POLYMYXIN-HC 1 % OT SOLN: 3.0000 [drp] | Freq: Four times a day (QID) | OTIC | 0 refills | Status: DC

## 2023-11-01 NOTE — Progress Notes (Unsigned)
 Leron Glance, NP-C Phone: [phone]  Jill Hawkins Head is a 48 y.o. female who presents today for follow up.   Discussed the use of AI scribe software for clinical note transcription with the patient, who gave verbal consent to proceed.  History of Present Illness   Jill Hawkins is a 48 year old female with rheumatoid arthritis and fibromyalgia who presents for a three-month follow-up and to receive a TB test and Hepatitis B vaccine required by her new employer.  She has been on Zepbound 12.5 mg weekly since May or June, which has helped her lose 15-16 pounds since her last visit three months ago. She started Zepbound in January or February and has been on the current dose since May or June. She experienced some gastrointestinal issues previously, which led to a cautious approach in adjusting the medication. She is concerned about maintaining muscle mass and bone density while losing weight.  She has a history of rheumatoid arthritis and fibromyalgia, for which she is currently stable on Enbrel. She was previously on Rinvoq but discontinued it due to stability concerns. She had a checkup with her rheumatologist last month.  She recently had a bone density test, which showed a slight worsening compared to the previous one. She takes vitamin D  daily, an ADEK supplement, and a calcium chew as recommended by her gastric doctor. She is on vitamin D  50,000 IU twice weekly due to previously low levels. She is concerned about her bone health due to her mother's history of fractures and subsequent complications.  She experiences anxiety and worry, particularly related to starting a new job and the associated changes. She is currently on a medication for mood, which helps with sleep but not significantly with anxiety.  She reports a persistent clogged feeling in her right ear, which has not been fully resolved with Allegra  D and Flonase . She experiences intermittent relief but the issue recurs,  affecting her hearing. She avoids long-term use of Flonase  due to epistaxis.      Social History   Tobacco Use  Smoking Status Former   Types: Cigarettes   Passive exposure: Past  Smokeless Tobacco Never  Tobacco Comments   Smoker casually in her High School Years    Current Outpatient Medications on File Prior to Visit  Medication Sig Dispense Refill   ADDYI  100 MG TABS TAKE 1 TABLET (100 MG TOTAL) BY MOUTH DAILY. 30 tablet 2   ALPRAZolam  (XANAX ) 1 MG tablet TAKE 1 TABLET(1 MG) BY MOUTH DAILY AS NEEDED FOR ANXIETY 15 tablet 1   B Complex Vitamins (VITAMIN-B COMPLEX) TABS Take 1 tablet by mouth daily.     cyanocobalamin  (,VITAMIN B-12,) 1000 MCG/ML injection Inject 1,000 mcg into the muscle every 7 (seven) days.     EPINEPHrine  0.3 mg/0.3 mL IJ SOAJ injection Inject 0.3 mg into the muscle as needed for anaphylaxis. 2 each 0   etanercept (ENBREL SURECLICK) 50 MG/ML injection Inject 50 mLs into the skin once a week. On friday     fexofenadine  (ALLEGRA ) 180 MG tablet TAKE 1 TABLET BY MOUTH DAILY 90 tablet 1   fluticasone  (FLONASE ) 50 MCG/ACT nasal spray Place 2 sprays into both nostrils daily. 16 mL 1   folic acid  (FOLVITE ) 1 MG tablet TAKE 1 TABLET BY MOUTH DAILY 90 tablet 0   hydrocortisone  (ANUSOL -HC) 2.5 % rectal cream Place 1 Application rectally 2 (two) times daily. 30 g 0   levocetirizine (XYZAL ) 5 MG tablet Take 1 tablet (5 mg total) by mouth  every evening. 30 tablet 11   levonorgestrel (LILETTA) 18.6 MCG/DAY IUD IUD by Intrauterine route.     lidocaine  (XYLOCAINE ) 5 % ointment Apply 1 Application topically 4 (four) times daily as needed. 35 g 0   methocarbamol  (ROBAXIN ) 500 MG tablet Take 500 mg by mouth 3 (three) times daily.     montelukast  (SINGULAIR ) 10 MG tablet TAKE ONE TABLET BY MOUTH EVERY NIGHT AT BEDTIME 30 tablet 2   Multiple Vitamin (MULTI-VITAMINS) TABS Take 1 tablet by mouth daily.     ondansetron  (ZOFRAN -ODT) 4 MG disintegrating tablet 4 mg every 8 (eight) hours  as needed.     pregabalin  (LYRICA ) 100 MG capsule Take 1 capsule (100 mg total) by mouth 2 (two) times daily. 180 capsule 1   scopolamine  (TRANSDERM-SCOP) 1 MG/3DAYS Place 1 patch (1.5 mg total) onto the skin every 3 (three) days. 10 patch 0   sertraline  (ZOLOFT ) 50 MG tablet Take 1 tablet (50 mg total) by mouth at bedtime. 90 tablet 2   SUMAtriptan  (IMITREX ) 50 MG tablet TAKE 1 TABLET BY MOUTH AT ONSET OF MIGRAINE; MAY REPEAT 1 TABLET IN 2 HOURS IF NEEDED. 9 tablet 0   valACYclovir  (VALTREX ) 1000 MG tablet Take 1 tablet (1,000 mg total) by mouth 2 (two) times daily. X 1 day. Start ASAP after symptom onset. 20 tablet 2   [DISCONTINUED] buPROPion  (WELLBUTRIN ) 100 MG tablet TAKE ONE TABLET BY MOUTH THREE TIMES A DAY 90 tablet 0   No current facility-administered medications on file prior to visit.     ROS see history of present illness  Objective  Physical Exam Vitals:   11/01/23 1517  BP: 110/62  Pulse: 71  Temp: 98.1 F (36.7 C)  SpO2: 95%    BP Readings from Last 3 Encounters:  11/01/23 110/62  08/01/23 120/68  06/25/23 135/84   Wt Readings from Last 3 Encounters:  11/01/23 228 lb 12.8 oz (103.8 kg)  08/01/23 245 lb 12.8 oz (111.5 kg)  06/25/23 254 lb (115.2 kg)    Physical Exam Constitutional:      General: She is not in acute distress.    Appearance: Normal appearance.  HENT:     Head: Normocephalic.     Right Ear: Tympanic membrane, ear canal and external ear normal.     Left Ear: Tympanic membrane, ear canal and external ear normal.  Cardiovascular:     Rate and Rhythm: Normal rate and regular rhythm.     Heart sounds: Normal heart sounds.  Pulmonary:     Effort: Pulmonary effort is normal.     Breath sounds: Normal breath sounds.  Skin:    General: Skin is warm and dry.  Neurological:     General: No focal deficit present.     Mental Status: She is alert.  Psychiatric:        Mood and Affect: Mood normal.        Behavior: Behavior normal.       Assessment/Plan: Please see individual problem list.  Assessment and Plan    Adult Wellness Visit   She requires a TB test and Hepatitis B vaccine for her new job. Emphasized the importance of weight-bearing exercise to maintain muscle mass and bone density while using Zepbound. Administer TB Gold test and Hepatitis B vaccine series. Encourage weight-bearing exercise.  Osteopenia   Bone density test shows moderate worsening. Discussed potential osteoporosis treatments and side effects, considering her family history of fractures. Continue vitamin D  and calcium supplementation. Encourage weight-bearing exercise. Research  osteoporosis treatments such as Prolia, Reclast, and Fosamax.  Rheumatoid arthritis and fibromyalgia   Her condition is well-managed on Enbrel, allowing discontinuation of Rinvoq. Continue Enbrel as prescribed.  Obesity, post-bariatric surgery, on GLP-1 agonist therapy   She has experienced significant weight loss on Zepbound. Discussed the risk of osteoporosis with rapid weight loss and the importance of exercise for muscle mass. Continue Zepbound 12.5 mg weekly. Encourage weight-bearing exercise.  Chronic right eustachian tube dysfunction   She experiences an intermittent clogged sensation not relieved by Allegra  D and Flonase , likely due to allergies. No significant fluid buildup is present. Prescribe steroid ear drops.  Anxiety   She experiences high anxiety due to a new job. Current medication aids sleep but not anxiety. Prefers a gradual increase in medication to avoid drowsiness. Increase current medication to 75 mg by taking one and a half of the 50 mg tablets.       Anxiety and depression  Ear congestion, right -     Neomycin -Polymyxin-HC; Place 3 drops into the right ear every 6 (six) hours.  Dispense: 10 mL; Refill: 0  Seronegative rheumatoid arthritis (HCC)  Osteopenia, unspecified location  Fibromyalgia  Obesity (BMI 35.0-39.9 without  comorbidity)  Screening-pulmonary TB -     QuantiFERON-TB Gold Plus  Need for hepatitis vaccination -     Heplisav-B  (HepB-CPG) Vaccine     Return in about 3 months (around 01/31/2024) for Follow up.   Leron Glance, NP-C [facility]

## 2023-11-04 LAB — QUANTIFERON-TB GOLD PLUS
Mitogen-NIL: 8.3 [IU]/mL
NIL: 0.02 [IU]/mL
QuantiFERON-TB Gold Plus: NEGATIVE
TB1-NIL: 0.01 [IU]/mL
TB2-NIL: 0.01 [IU]/mL

## 2023-11-06 ENCOUNTER — Ambulatory Visit: Payer: Self-pay | Admitting: Nurse Practitioner

## 2023-11-16 ENCOUNTER — Encounter: Payer: Self-pay | Admitting: Nurse Practitioner

## 2023-11-16 NOTE — Assessment & Plan Note (Signed)
 She has experienced significant weight loss on Zepbound. Tolerating well. Continue Zepbound 12.5 mg weekly. Encourage healthy diet and regular exercise.

## 2023-11-16 NOTE — Assessment & Plan Note (Signed)
 Bone density test shows moderate worsening. Discussed potential osteoporosis treatments and side effects. Continue vitamin D  and calcium supplementation. Encourage weight-bearing exercise. Research osteoporosis treatments such as Prolia, Reclast, and Fosamax.

## 2023-11-16 NOTE — Assessment & Plan Note (Addendum)
 She experiences high anxiety due to a new job. Zoloft  aids sleep but not anxiety. Prefers a gradual increase in medication to avoid drowsiness. Increase Zoloft  to 75 mg by taking one and a half of the 50 mg tablets. Counseled patient on common side effects. Encouraged to contact if worsening symptoms, unusual behavior changes or suicidal thoughts occur. We will continue to monitor.

## 2023-11-16 NOTE — Assessment & Plan Note (Signed)
 She experiences an intermittent clogged sensation not relieved by Azithromycin, Prednisone , Allegra  D and Flonase . No significant fluid buildup or infection is present. Trial ear drops at patient's request. Encourage continued use of Allegra  and Flonase .

## 2023-11-16 NOTE — Assessment & Plan Note (Signed)
 Her RA is managed with Enbrel, but chronic pain and fatigue persist, possibly related to RA. Continue Enbrel for RA management.

## 2023-12-03 ENCOUNTER — Ambulatory Visit (INDEPENDENT_AMBULATORY_CARE_PROVIDER_SITE_OTHER)

## 2023-12-03 DIAGNOSIS — Z23 Encounter for immunization: Secondary | ICD-10-CM

## 2023-12-03 NOTE — Progress Notes (Signed)
 Patient is in office today for a nurse visit for 2nd Hep B Immunization. Patient Injection was given in the  Left vastus lateralis. Patient tolerated injection well.

## 2023-12-06 ENCOUNTER — Other Ambulatory Visit (HOSPITAL_BASED_OUTPATIENT_CLINIC_OR_DEPARTMENT_OTHER): Payer: Self-pay | Admitting: Obstetrics & Gynecology

## 2023-12-11 ENCOUNTER — Other Ambulatory Visit

## 2024-01-04 ENCOUNTER — Encounter: Payer: Self-pay | Admitting: Family

## 2024-01-04 ENCOUNTER — Ambulatory Visit: Admitting: Family

## 2024-01-04 VITALS — BP 144/78 | HR 70 | Ht 67.0 in | Wt 228.4 lb

## 2024-01-04 DIAGNOSIS — H6501 Acute serous otitis media, right ear: Secondary | ICD-10-CM

## 2024-01-04 MED ORDER — AZITHROMYCIN 250 MG PO TABS: ORAL_TABLET | ORAL | 0 refills | Status: AC

## 2024-01-04 NOTE — Assessment & Plan Note (Addendum)
-   Start Zpack as prescribed. FU if symptoms fail to resolve or improve. - Stop taking allergra D and switch to allergra and flonase .  - Recommend if wanting to do decongestant to only do the OTC version for people with high BP as she has hx hypertension but is well controlled without medications.

## 2024-01-04 NOTE — Progress Notes (Signed)
 New Patient Office Visit  Subjective  Patient ID: Jill Hawkins, female    DOB: [birth year]  Age: 48 y.o. MRN: [id number]  CC:  Chief Complaint  Patient presents with   Otalgia    Right    HPI Jill Hawkins presents to establish care Previous Primary Care provider/office:   she does have additional concerns to discuss today.   Patient is here for acute visit. She endorses Right ear pain that is sore to touch behind her right ear. Hx ear pain but was told to take decongestant allergy medication by a previous provider. She states it has not been helping and now she is having difficulty hearing out of her right ear. Denies fever, chills, or drainage. No recent sinus infections reported by patient. She has allergy to penicillins. Will order Zpac for otitis media.    Outpatient Encounter Medications as of 01/04/2024  Medication Sig   ADDYI  100 MG TABS TAKE 1 TABLET BY MOUTH DAILY.   ALPRAZolam  (XANAX ) 1 MG tablet TAKE 1 TABLET(1 MG) BY MOUTH DAILY AS NEEDED FOR ANXIETY   azithromycin (ZITHROMAX) 250 MG tablet Take 2 tablets (500 mg total) by mouth daily for 1 day, THEN 1 tablet (250 mg total) daily for 4 days.   B Complex Vitamins (VITAMIN-B COMPLEX) TABS Take 1 tablet by mouth daily.   cyanocobalamin  (,VITAMIN B-12,) 1000 MCG/ML injection Inject 1,000 mcg into the muscle every 7 (seven) days.   EPINEPHrine  0.3 mg/0.3 mL IJ SOAJ injection Inject 0.3 mg into the muscle as needed for anaphylaxis.   etanercept (ENBREL SURECLICK) 50 MG/ML injection Inject 50 mLs into the skin once a week. On friday   fexofenadine  (ALLEGRA ) 180 MG tablet TAKE 1 TABLET BY MOUTH DAILY   fluticasone  (FLONASE ) 50 MCG/ACT nasal spray Place 2 sprays into both nostrils daily.   folic acid  (FOLVITE ) 1 MG tablet TAKE 1 TABLET BY MOUTH DAILY   hydrocortisone  (ANUSOL -HC) 2.5 % rectal cream Place 1 Application rectally 2 (two) times daily.   levocetirizine (XYZAL ) 5 MG tablet Take 1 tablet (5 mg total) by mouth  every evening.   levonorgestrel (LILETTA) 18.6 MCG/DAY IUD IUD by Intrauterine route.   methocarbamol  (ROBAXIN ) 500 MG tablet Take 500 mg by mouth 3 (three) times daily.   Multiple Vitamin (MULTI-VITAMINS) TABS Take 1 tablet by mouth daily.   ondansetron  (ZOFRAN -ODT) 4 MG disintegrating tablet 4 mg every 8 (eight) hours as needed.   pregabalin  (LYRICA ) 100 MG capsule Take 1 capsule (100 mg total) by mouth 2 (two) times daily.   scopolamine  (TRANSDERM-SCOP) 1 MG/3DAYS Place 1 patch (1.5 mg total) onto the skin every 3 (three) days.   sertraline  (ZOLOFT ) 50 MG tablet Take 1 tablet (50 mg total) by mouth at bedtime.   SUMAtriptan  (IMITREX ) 50 MG tablet TAKE 1 TABLET BY MOUTH AT ONSET OF MIGRAINE; MAY REPEAT 1 TABLET IN 2 HOURS IF NEEDED.   valACYclovir  (VALTREX ) 1000 MG tablet Take 1 tablet (1,000 mg total) by mouth 2 (two) times daily. X 1 day. Start ASAP after symptom onset.   lidocaine  (XYLOCAINE ) 5 % ointment Apply 1 Application topically 4 (four) times daily as needed. (Patient not taking: Reported on 01/04/2024)   montelukast  (SINGULAIR ) 10 MG tablet TAKE ONE TABLET BY MOUTH EVERY NIGHT AT BEDTIME (Patient not taking: Reported on 01/04/2024)   NEOMYCIN -POLYMYXIN-HYDROCORTISONE  (CORTISPORIN) 1 % SOLN OTIC solution Place 3 drops into the right ear every 6 (six) hours. (Patient not taking: Reported on 01/04/2024)   [DISCONTINUED] buPROPion  (WELLBUTRIN )  100 MG tablet TAKE ONE TABLET BY MOUTH THREE TIMES A DAY   No facility-administered encounter medications on file as of 01/04/2024.    Past Medical History:  Diagnosis Date   Anemia    hx iron infusions   Anxiety    Chronic pain    r/t ddd   Depression    Frequent headaches    GERD (gastroesophageal reflux disease)    History of fainting spells of unknown cause    History of hiatal hernia    History of kidney stones    Hypertension    no issues since gastric bypass   Migraine    PONV (postoperative nausea and vomiting)    Rheumatoid  arthritis (HCC)    UTI (lower urinary tract infection)     Past Surgical History:  Procedure Laterality Date   BOTOX  INJECTION N/A 06/07/2022   Procedure: BOTOX  INJECTION;  Surgeon: Desiderio Schanz, MD;  Location: [facility] ORS;  Service: General;  Laterality: N/A;   CARPAL TUNNEL RELEASE  02/28/2003   CESAREAN SECTION     x 2   CHOLECYSTECTOMY  02/27/2013   GASTRIC BYPASS  04/14/2013   GROWTH PLATE SURGERY  98/98/8009   Right hip   HIATAL HERNIA REPAIR     SPHINCTEROTOMY N/A 06/07/2022   Procedure: SPHINCTEROTOMY, botox  chemical;  Surgeon: Desiderio Schanz, MD;  Location: [facility] ORS;  Service: General;  Laterality: N/A;    Family History  Problem Relation Age of Onset   Heart disease Mother    Arthritis Mother    Hypertension Mother    Mental illness Mother    Diabetes Mother    Alcohol abuse Father    Cancer Father    Hypertension Father    Mental illness Brother    Heart disease Maternal Grandmother    Hypertension Maternal Grandmother    Mental illness Maternal Grandmother    Diabetes Maternal Grandmother    Cancer Maternal Grandfather    Hypertension Maternal Grandfather    Mental illness Paternal Grandmother     Social History   Socioeconomic History   Marital status: Married    Spouse name: Not on file   Number of children: Not on file   Years of education: Not on file   Highest education level: Not on file  Occupational History   Not on file  Tobacco Use   Smoking status: Former    Types: Cigarettes    Passive exposure: Past   Smokeless tobacco: Never   Tobacco comments:    Smoker casually in her High School Years  Vaping Use   Vaping status: Never Used  Substance and Sexual Activity   Alcohol use: No    Comment: rare   Drug use: No   Sexual activity: Yes    Partners: Male    Birth control/protection: I.U.D.  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   Overall Financial Resource Strain (CARDIA)    Difficulty of Paying Living Expenses: Not hard at all  Food Insecurity: No Food Insecurity (11/21/2023)   Received from [facility]   Hunger Vital Sign    Within  the past 12 months, you worried that your food would run out before you got the money to buy more.: Never true    Within the past 12 months, the food you bought just didn't last and you didn't have money to get more.: Never true  Transportation Needs: No Transportation Needs (11/21/2023)   Received from [facility] - Transportation    In the past 12 months, has lack of transportation kept you from medical appointments or from getting medications?: No    Lack of Transportation (Non-Medical): No  Physical Activity: Not on file  Stress: Not on file  Social Connections: Not on file  Intimate Partner Violence: Not At Risk (10/03/2023)   Received from [facility]   Humiliation, Afraid, Rape, and Kick questionnaire    Within the last year, have you been afraid of your partner or ex-partner?: No    Within the last year, have you been humiliated or emotionally abused in other ways by your partner or ex-partner?: No    Within the last year, have you been kicked, hit, slapped, or otherwise physically hurt by your partner or ex-partner?: No    Within the last year, have you been raped or forced to have any kind of sexual activity by your partner or ex-partner?: No    Review of Systems  Constitutional:  Negative for malaise/fatigue.  HENT:  Positive for ear pain and hearing loss.   Eyes:  Negative for blurred vision and pain.  Respiratory:  Negative for cough and shortness of breath.   Cardiovascular:  Negative for chest pain, palpitations, claudication  and leg swelling.  Gastrointestinal:  Negative for abdominal pain, blood in stool, constipation, diarrhea, nausea and vomiting.  Genitourinary:  Negative for dysuria, frequency and urgency.  Musculoskeletal: Negative.   Skin: Negative.   Neurological:  Negative for dizziness, tingling, sensory change and headaches.  Endo/Heme/Allergies: Negative.   Psychiatric/Behavioral: Negative.          Objective   BP (!) 144/78   Pulse 70   Ht 5' 7 (1.702 m)   Wt 228 lb 6.4 oz (103.6 kg)   SpO2 97%   BMI 35.77 kg/m   Physical Exam Vitals and nursing note reviewed.  Constitutional:      Appearance: Normal appearance.  HENT:     Head: Normocephalic.     Right Ear: Decreased hearing noted. Tenderness present. Tympanic membrane is injected.     Left Ear: Tympanic membrane is injected.  Eyes:     Extraocular Movements: Extraocular movements intact.     Pupils: Pupils are equal, round, and reactive to light.  Cardiovascular:     Rate and Rhythm: Normal rate and regular rhythm.     Pulses: Normal pulses.     Heart sounds: Normal heart sounds. No murmur heard. Pulmonary:     Effort: Pulmonary effort is normal. No respiratory distress.     Breath sounds: Normal breath sounds.  Abdominal:     General: There is no distension.     Tenderness: There is no abdominal tenderness.  Musculoskeletal:        General: No tenderness. Normal range of motion.     Cervical back: Normal range of motion and neck supple.     Right lower leg: No edema.     Left lower leg: No edema.  Skin:    General: Skin is warm and dry.     Coloration: Skin is not jaundiced.     Findings:  No erythema.  Neurological:     General: No focal deficit present.     Mental Status: She is alert and oriented to person, place, and time.  Psychiatric:        Mood and Affect: Mood normal.        Speech: Speech normal.        Behavior: Behavior is cooperative.        Cognition and Memory: Memory is not impaired.        Assessment & Plan Right acute serous otitis media, recurrence not specified - Start Zpack as prescribed. FU if symptoms fail to resolve or improve. - Stop taking allergra D and switch to allergra and flonase .  - Recommend if wanting to do decongestant to only do the OTC version for people with high BP as she has hx hypertension but is well controlled without medications.  Return if symptoms worsen or fail to improve., for F/U.   Total time spent: 20 minutes  Oddis DELENA Cain, FNP  01/04/2024  This document may have been prepared by [facility] Voice Recognition software and as such may include unintentional dictation errors.

## 2024-01-05 ENCOUNTER — Encounter: Payer: Self-pay | Admitting: Family

## 2024-01-21 ENCOUNTER — Encounter: Payer: Self-pay | Admitting: Family

## 2024-02-04 ENCOUNTER — Telehealth: Payer: Self-pay | Admitting: Nurse Practitioner

## 2024-02-04 NOTE — Telephone Encounter (Signed)
 Voice box full, unable to leave a message. Sent MyChart message.

## 2024-02-07 ENCOUNTER — Other Ambulatory Visit (HOSPITAL_COMMUNITY): Payer: Self-pay

## 2024-02-07 ENCOUNTER — Ambulatory Visit: Admitting: Nurse Practitioner

## 2024-02-19 ENCOUNTER — Encounter: Payer: Self-pay | Admitting: Family

## 2024-02-19 ENCOUNTER — Ambulatory Visit: Admitting: Family

## 2024-02-19 VITALS — BP 104/72 | HR 72 | Ht 67.0 in | Wt 232.8 lb

## 2024-02-19 DIAGNOSIS — Z0130 Encounter for examination of blood pressure without abnormal findings: Secondary | ICD-10-CM

## 2024-02-19 DIAGNOSIS — R600 Localized edema: Secondary | ICD-10-CM | POA: Diagnosis not present

## 2024-02-19 NOTE — Progress Notes (Signed)
 "  Acute Office Visit  Subjective:     Patient ID: Jill Hawkins, female    DOB: 1975/03/09, 48 y.o.   MRN: [id number]  Patient is in today for  Chief Complaint  Patient presents with   Leg Swelling    Bilateral lower leg edema, L>R    Patient is here today with concerns of lower extremity swelling.  She says this is a recent development, and there have not been any changes to her diet or medications that she thinks could be causing this.  Of note, she did just recover from the flu.    No other concerns today.      Review of Systems  Cardiovascular:  Positive for leg swelling.  All other systems reviewed and are negative.       Objective:    BP 104/72   Pulse 72   Ht 5' 7 (1.702 m)   Wt 232 lb 12.8 oz (105.6 kg)   SpO2 97%   BMI 36.46 kg/m   Physical Exam Vitals and nursing note reviewed.  Constitutional:      Appearance: Normal appearance. She is normal weight.  HENT:     Head: Normocephalic.  Eyes:     Extraocular Movements: Extraocular movements intact.     Conjunctiva/sclera: Conjunctivae normal.     Pupils: Pupils are equal, round, and reactive to light.  Cardiovascular:     Rate and Rhythm: Normal rate.  Pulmonary:     Effort: Pulmonary effort is normal.  Musculoskeletal:        General: Swelling present.     Right lower leg: 2+ Pitting Edema present.     Left lower leg: 3+ Pitting Edema present.  Neurological:     General: No focal deficit present.     Mental Status: She is alert and oriented to person, place, and time. Mental status is at baseline.  Psychiatric:        Mood and Affect: Mood normal.        Behavior: Behavior normal.        Thought Content: Thought content normal.     No results found for any visits on 02/19/24.  No results found for this or any previous visit (from the past 2160 hours).  Allergies as of 02/19/2024       Reactions   Penicillins Rash, Swelling, Dermatitis   Molybdenum Dermatitis    Oxycodone -acetaminophen  Rash   Pt states she is taking hlaf of 5 mg and doing ok   Tramadol Anxiety, Itching   Codeine Other (See Comments)   Chest pains   Tape Hives   Surgical tape        Medication List        Accurate as of February 19, 2024 10:30 AM. If you have any questions, ask your nurse or doctor.          Addyi  100 MG Tabs Generic drug: Flibanserin  TAKE 1 TABLET BY MOUTH DAILY.   ALPRAZolam  1 MG tablet Commonly known as: XANAX  TAKE 1 TABLET(1 MG) BY MOUTH DAILY AS NEEDED FOR ANXIETY   cyanocobalamin  1000 MCG/ML injection Commonly known as: VITAMIN B12 Inject 1,000 mcg into the muscle every 7 (seven) days.   Enbrel SureClick 50 MG/ML injection Generic drug: etanercept Inject 50 mLs into the skin once a week. On friday   EPINEPHrine  0.3 mg/0.3 mL Soaj injection Commonly known as: EPI-PEN Inject 0.3 mg into the muscle as needed for anaphylaxis.   fexofenadine  180 MG tablet Commonly known  as: ALLEGRA  TAKE 1 TABLET BY MOUTH DAILY   fluticasone  50 MCG/ACT nasal spray Commonly known as: FLONASE  Place 2 sprays into both nostrils daily.   folic acid  1 MG tablet Commonly known as: FOLVITE  TAKE 1 TABLET BY MOUTH DAILY   hydrocortisone  2.5 % rectal cream Commonly known as: Anusol -HC Place 1 Application rectally 2 (two) times daily.   levocetirizine 5 MG tablet Commonly known as: XYZAL  Take 1 tablet (5 mg total) by mouth every evening.   levonorgestrel 18.6 MCG/DAY Iud IUD Commonly known as: LILETTA by Intrauterine route.   lidocaine  5 % ointment Commonly known as: XYLOCAINE  Apply 1 Application topically 4 (four) times daily as needed.   methocarbamol  500 MG tablet Commonly known as: ROBAXIN  Take 500 mg by mouth 3 (three) times daily.   montelukast  10 MG tablet Commonly known as: SINGULAIR  TAKE ONE TABLET BY MOUTH EVERY NIGHT AT BEDTIME   Multi-Vitamins Tabs Take 1 tablet by mouth daily.   NEOMYCIN -POLYMYXIN-HYDROCORTISONE  1 % Soln OTIC  solution Commonly known as: CORTISPORIN Place 3 drops into the right ear every 6 (six) hours.   ondansetron  4 MG disintegrating tablet Commonly known as: ZOFRAN -ODT 4 mg every 8 (eight) hours as needed.   pregabalin  100 MG capsule Commonly known as: Lyrica  Take 1 capsule (100 mg total) by mouth 2 (two) times daily.   scopolamine  1 MG/3DAYS Commonly known as: TRANSDERM-SCOP Place 1 patch (1.5 mg total) onto the skin every 3 (three) days.   sertraline  50 MG tablet Commonly known as: ZOLOFT  Take 1 tablet (50 mg total) by mouth at bedtime.   SUMAtriptan  50 MG tablet Commonly known as: IMITREX  TAKE 1 TABLET BY MOUTH AT ONSET OF MIGRAINE; MAY REPEAT 1 TABLET IN 2 HOURS IF NEEDED.   valACYclovir  1000 MG tablet Commonly known as: Valtrex  Take 1 tablet (1,000 mg total) by mouth 2 (two) times daily. X 1 day. Start ASAP after symptom onset.   Vitamin-B Complex Tabs Take 1 tablet by mouth daily.            Assessment & Plan Bilateral leg edema Suggested pt keep her legs elevated when she can at work and to use compression socks.  Will see if this has improved her symptoms after 1-2 weeks, then may see if we need to add or change diuretic meds.     Return 1-2 weeks.  Total time spent: 20 minutes  ALAN CHRISTELLA ARRANT, FNP  02/19/2024   This document may have been prepared by [facility] Voice Recognition software and as such may include unintentional dictation errors.  "

## 2024-02-19 NOTE — Assessment & Plan Note (Signed)
 Suggested pt keep her legs elevated when she can at work and to use compression socks.  Will see if this has improved her symptoms after 1-2 weeks, then may see if we need to add or change diuretic meds.

## 2024-02-20 ENCOUNTER — Encounter: Payer: Self-pay | Admitting: Nurse Practitioner

## 2024-02-25 ENCOUNTER — Ambulatory Visit: Admitting: Nurse Practitioner

## 2024-02-29 ENCOUNTER — Ambulatory Visit: Payer: Self-pay

## 2024-02-29 ENCOUNTER — Other Ambulatory Visit: Payer: Self-pay | Admitting: Family

## 2024-02-29 ENCOUNTER — Ambulatory Visit
Admission: RE | Admit: 2024-02-29 | Discharge: 2024-02-29 | Disposition: A | Discharge status: Home / Self Care | Source: Ambulatory Visit | Attending: Family | Admitting: Family

## 2024-02-29 ENCOUNTER — Other Ambulatory Visit (HOSPITAL_BASED_OUTPATIENT_CLINIC_OR_DEPARTMENT_OTHER): Payer: Self-pay | Admitting: Obstetrics & Gynecology

## 2024-02-29 DIAGNOSIS — R600 Localized edema: Secondary | ICD-10-CM

## 2024-02-29 DIAGNOSIS — M79605 Pain in left leg: Secondary | ICD-10-CM | POA: Insufficient documentation

## 2024-02-29 NOTE — Addendum Note (Signed)
 Addended by: ORLEAN PALMA on: 02/29/2024 02:51 PM   Modules accepted: Orders

## 2024-02-29 NOTE — Telephone Encounter (Signed)
 FYI Only or Action Required?: Action required by provider: request for appointment and clinical question for provider. Declines ED, pt would like to be seen in office. Please advise/reach out to schedule if appropriate.  Patient was last seen in primary care on 02/19/2024 by Orlean Alan HERO, FNP.  Called Nurse Triage reporting Leg Swelling (/).  Symptoms began several weeks ago.  Interventions attempted: Rest, hydration, or home remedies and Ice/heat application.  Symptoms are: gradually worsening.  Triage Disposition: Go to ED Now (or PCP Triage)  Patient/caregiver understands and will follow disposition?: No, refuses disposition    Had the flu the week after Thanksgiving. Hasn't felt right since with some mild dizziness ever since. 1 week before Christmas onset of swelling in both legs, left worse than right. None in the morning, severe by EOD. Leg swelling has happened before and normally takes a diuretic, normally doesn't have any pain. Onset small knot on left shin a few days ago, painful to touch. 7/10 when walking, 10/10 when touched. Leg feels heavy. Denies CP. Increased lethargy and mild SOB when exerting self for the past few days. Denies hx of CHF. No fever.   Saw an NP at [facility] today where pts works as she couldn't get in with her PCP and was told to wear compression socks and keep legs elevated. Got an US  today. Was told that if there was a blood clot, the technician wouldn't have let her leave but that the official results are not yet available. Advised ED, pt declines stating she doesn't want to sit in an ED with a lot of sick people for hours. Declines UC. Just wants to schedule appt in office at this time. Forwarding pt symptoms and request for appt to office. Advised UC or ED for worsening symptoms in the meantime. Pt verbalized understanding.        Copied from CRM [id number]. Topic: Clinical - Red Word Triage >> Feb 29, 2024  5:00 PM Roselie BROCKS wrote: Red  Word that prompted transfer to Nurse Triage: Patient states she has swelling in both legs and pretty bad pain in left leg. Reason for Disposition  [1] Difficulty breathing with exertion (e.g., walking) and [2] NEW or getting WORSE  Answer Assessment - Initial Assessment Questions 1. ONSET: When did the swelling start? (e.g., minutes, hours, days)     2 weeks ago  2. LOCATION: What part of the leg is swollen?  Are both legs swollen or just one leg?     Both legs, left worse than right  3. SEVERITY: How bad is the swelling? (e.g., localized; mild, moderate, severe)     None in the morning, severe swelling by the end of the day   4. REDNESS: Is there redness or signs of infection?     Denies  5. PAIN: Is the swelling painful to touch? If Yes, ask: How painful is it?   (Scale 1-10; mild, moderate or severe)     7/10 with walking, 10/10 when touched  6. FEVER: Do you have a fever? If Yes, ask: What is it, how was it measured, and when did it start?      Denies  7. CAUSE: What do you think is causing the leg swelling?     Unsure  8. MEDICAL HISTORY: Do you have a history of blood clots (e.g., DVT), cancer, heart failure, kidney disease, or liver failure?     Denies  9. RECURRENT SYMPTOM: Have you had leg swelling before? If Yes, ask:  When was the last time? What happened that time?     Yes, happened several years ago with mild swelling in BLE and was prescribed a diuretic   10. OTHER SYMPTOMS: Do you have any other symptoms? (e.g., chest pain, difficulty breathing)       Mild dizziness, mild SOB/increased lethargy with exertion, pain in left shin  11. PREGNANCY: Is there any chance you are pregnant? When was your last menstrual period?       Denies. Has an IUD.  Protocols used: Leg Swelling and Edema-A-AH

## 2024-03-03 ENCOUNTER — Telehealth: Payer: Self-pay

## 2024-03-03 ENCOUNTER — Ambulatory Visit: Payer: Self-pay

## 2024-03-03 NOTE — Telephone Encounter (Signed)
 Detailed vm left informing pt that Dr. Onesimo suggests she go to the ED to be seen mychart msg also sent

## 2024-03-03 NOTE — Telephone Encounter (Signed)
 Can pt please be added to the waitlist for an appt with Leron Glance, NP this week or next for any cancellations as pt wants to see PCP and pcp is booked. I have sent pt a mychart msg informing that I can have someone add her to the waitlist

## 2024-03-03 NOTE — Telephone Encounter (Signed)
 E2C2 PLEASE RELAY PROVIDERS ADVICE OF GOING TO THE ED FOR EVAL

## 2024-03-03 NOTE — Telephone Encounter (Signed)
 Noted mychart msg was sent to pt

## 2024-03-10 ENCOUNTER — Encounter: Payer: Self-pay | Admitting: *Deleted

## 2024-03-13 ENCOUNTER — Encounter: Payer: Self-pay | Admitting: Family Medicine

## 2024-03-18 ENCOUNTER — Encounter: Payer: Self-pay | Admitting: Nurse Practitioner

## 2024-03-18 ENCOUNTER — Ambulatory Visit: Admitting: Nurse Practitioner

## 2024-03-18 VITALS — BP 118/82 | HR 76 | Temp 97.5°F | Ht 67.0 in | Wt 202.4 lb

## 2024-03-18 DIAGNOSIS — H938X1 Other specified disorders of right ear: Secondary | ICD-10-CM | POA: Diagnosis not present

## 2024-03-18 DIAGNOSIS — H8111 Benign paroxysmal vertigo, right ear: Secondary | ICD-10-CM | POA: Diagnosis not present

## 2024-03-18 DIAGNOSIS — R600 Localized edema: Secondary | ICD-10-CM | POA: Diagnosis not present

## 2024-03-18 MED ORDER — FUROSEMIDE 20 MG PO TABS: 20.0000 mg | ORAL_TABLET | Freq: Every day | ORAL | 1 refills | Status: AC | PRN

## 2024-03-18 NOTE — Progress Notes (Signed)
 " Leron Glance, NP-C Phone: [phone]  Jill Hawkins is a 49 y.o. female who presents today for follow up.   Discussed the use of AI scribe software for clinical note transcription with the patient, who gave verbal consent to proceed.  History of Present Illness   Jill Hawkins is a 49 year old female who presents with persistent ear issues and leg swelling.  She has been experiencing ongoing ear issues, describing a sensation of a 'knot' behind the ear that affects her hearing. These symptoms have persisted for several months, with temporary relief following courses of antibiotics and steroids, but have since recurred. Her hearing is muffled and not as sharp, with decreased hearing in the affected ear.  Approximately two to three weeks before Christmas, she experienced the flu, which was followed by increased fatigue and dizziness. The dizziness, which began with the flu, persists, particularly when bending over or standing up quickly. She describes episodes of near syncope, especially in the shower, with slight improvement but ongoing presence of dizziness.  She reports bilateral leg swelling that began about a week after returning to work post-flu. The left leg is particularly painful, with discoloration and the patient reports her dermatologist said blood vessels had burst. She has been using mometasone  ointment and wearing compression stockings, though the latter is painful. She has a history of similar swelling, which previously resolved with spironolactone. She experienced a 10-pound weight increase in one week, which has since decreased.  She experiences chest tightness and fatigue, particularly after exertion, such as grocery shopping. No shortness of breath but describes chest tightness and exhaustion. She has a history of cardiac evaluation, including a stress test and echocardiogram, with previous clearance from cardiology. She has not been on Eliquis despite a previous  recommendation, following a second opinion that deemed it unnecessary.  Her current medications include Allegra  in the morning and Xyzal  at night for allergies. She does not use Flonase  regularly due to a history of epistaxis and has not been using Singulair  since starting Xyzal .      Tobacco Use History[1]  Medications Ordered Prior to Encounter[2]   ROS see history of present illness  Objective  Physical Exam Vitals:   03/18/24 1532  BP: 118/82  Pulse: 76  Temp: (!) 97.5 F (36.4 C)  SpO2: 91%    BP Readings from Last 3 Encounters:  03/20/24 118/83  03/18/24 118/82  02/19/24 104/72   Wt Readings from Last 3 Encounters:  03/20/24 197 lb (89.4 kg)  03/18/24 202 lb 6.4 oz (91.8 kg)  02/19/24 232 lb 12.8 oz (105.6 kg)    Physical Exam Constitutional:      General: She is not in acute distress.    Appearance: Normal appearance.  HENT:     Head: Normocephalic.  Cardiovascular:     Rate and Rhythm: Normal rate and regular rhythm.     Heart sounds: Normal heart sounds.  Pulmonary:     Effort: Pulmonary effort is normal.     Breath sounds: Normal breath sounds.  Musculoskeletal:     Right lower leg: Edema present.     Left lower leg: Edema present.  Skin:    General: Skin is warm and dry.  Neurological:     General: No focal deficit present.     Mental Status: She is alert.  Psychiatric:        Mood and Affect: Mood normal.        Behavior: Behavior normal.  Assessment/Plan: Please see individual problem list.  Bilateral leg edema Assessment & Plan: Persistent edema with fatigue and chest tightness suggests a possible cardiac etiology. Differential includes cardiac, peripheral vascular disease, and lymphedema. Prescribe Lasix  20 mg daily for 3-5 days, then as needed. Refer to cardiology for evaluation of cardiac causes. Consider vascular referral if cardiology evaluation is inconclusive. Check lab work as outlined. Return precautions given to patient.    Orders: -     Furosemide ; Take 1 tablet (20 mg total) by mouth daily as needed for edema.  Dispense: 30 tablet; Refill: 1 -     Comprehensive metabolic panel with GFR -     TSH -     Brain natriuretic peptide  BPPV (benign paroxysmal positional vertigo), right Assessment & Plan: Fluid accumulation behind the right ear causes vertigo and hearing impairment. Previous treatments provided temporary relief. Refer to ENT for evaluation and management. Provide Epley exercises.   Orders: -     TSH -     CBC with Differential/Platelet -     Ambulatory referral to ENT  Ear congestion, right Assessment & Plan: She experiences an intermittent clogged sensation not relieved by Azithromycin , Prednisone , Allegra  D and Flonase . Fluid accumulation causing vertigo and hearing impairment. Refer to ENT for evaluation and management. Provide Epley exercises.   Orders: -     Ambulatory referral to ENT      Return in about 4 weeks (around 04/15/2024) for Follow up.   Leron Glance, NP-C [facility] [place]      [1]  Social History Tobacco Use  Smoking Status Former   Types: Cigarettes   Passive exposure: Past  Smokeless Tobacco Never  Tobacco Comments   Smoker casually in her High School Years  [2]  Current Outpatient Medications on File Prior to Visit  Medication Sig Dispense Refill   ADDYI  100 MG TABS TAKE 1 TABLET BY MOUTH ONCE DAILY. 30 tablet 2   ALPRAZolam  (XANAX ) 1 MG tablet TAKE 1 TABLET(1 MG) BY MOUTH DAILY AS NEEDED FOR ANXIETY 15 tablet 1   B Complex Vitamins (VITAMIN-B COMPLEX) TABS Take 1 tablet by mouth daily.     cyanocobalamin  (,VITAMIN B-12,) 1000 MCG/ML injection Inject 1,000 mcg into the muscle every 7 (seven) days.     EPINEPHrine  0.3 mg/0.3 mL IJ SOAJ injection Inject 0.3 mg into the muscle as needed for anaphylaxis. 2 each 0   etanercept (ENBREL SURECLICK) 50 MG/ML injection Inject 50 mLs into the skin once a week. On friday     fexofenadine   (ALLEGRA ) 180 MG tablet TAKE 1 TABLET BY MOUTH DAILY 90 tablet 1   fluticasone  (FLONASE ) 50 MCG/ACT nasal spray Place 2 sprays into both nostrils daily. 16 mL 1   folic acid  (FOLVITE ) 1 MG tablet TAKE 1 TABLET BY MOUTH DAILY 90 tablet 0   hydrocortisone  (ANUSOL -HC) 2.5 % rectal cream Place 1 Application rectally 2 (two) times daily. 30 g 0   levocetirizine (XYZAL ) 5 MG tablet Take 1 tablet (5 mg total) by mouth every evening. 30 tablet 11   levonorgestrel (LILETTA) 18.6 MCG/DAY IUD IUD by Intrauterine route.     lidocaine  (XYLOCAINE ) 5 % ointment Apply 1 Application topically 4 (four) times daily as needed. 35 g 0   methocarbamol  (ROBAXIN ) 500 MG tablet Take 500 mg by mouth 3 (three) times daily.     Multiple Vitamin (MULTI-VITAMINS) TABS Take 1 tablet by mouth daily.     ondansetron  (ZOFRAN -ODT) 4 MG disintegrating tablet 4 mg  every 8 (eight) hours as needed.     pregabalin  (LYRICA ) 100 MG capsule Take 1 capsule (100 mg total) by mouth 2 (two) times daily. 180 capsule 1   scopolamine  (TRANSDERM-SCOP) 1 MG/3DAYS Place 1 patch (1.5 mg total) onto the skin every 3 (three) days. 10 patch 0   sertraline  (ZOLOFT ) 50 MG tablet Take 1 tablet (50 mg total) by mouth at bedtime. 90 tablet 2   SUMAtriptan  (IMITREX ) 50 MG tablet TAKE 1 TABLET BY MOUTH AT ONSET OF MIGRAINE; MAY REPEAT 1 TABLET IN 2 HOURS IF NEEDED. 9 tablet 0   valACYclovir  (VALTREX ) 1000 MG tablet Take 1 tablet (1,000 mg total) by mouth 2 (two) times daily. X 1 day. Start ASAP after symptom onset. 20 tablet 2   [DISCONTINUED] buPROPion  (WELLBUTRIN ) 100 MG tablet TAKE ONE TABLET BY MOUTH THREE TIMES A DAY 90 tablet 0   No current facility-administered medications on file prior to visit.   "

## 2024-03-19 LAB — CBC WITH DIFFERENTIAL/PLATELET
Basophils Absolute: 0.1 K/uL (ref 0.0–0.1)
Basophils Relative: 1 % (ref 0.0–3.0)
Eosinophils Absolute: 0.1 K/uL (ref 0.0–0.7)
Eosinophils Relative: 0.9 % (ref 0.0–5.0)
HCT: 38.1 % (ref 36.0–46.0)
Hemoglobin: 12.7 g/dL (ref 12.0–15.0)
Lymphocytes Relative: 28.4 % (ref 12.0–46.0)
Lymphs Abs: 1.9 K/uL (ref 0.7–4.0)
MCHC: 33.4 g/dL (ref 30.0–36.0)
MCV: 92.2 fl (ref 78.0–100.0)
Monocytes Absolute: 0.8 K/uL (ref 0.1–1.0)
Monocytes Relative: 12.7 % — ABNORMAL HIGH (ref 3.0–12.0)
Neutro Abs: 3.8 K/uL (ref 1.4–7.7)
Neutrophils Relative %: 57 % (ref 43.0–77.0)
Platelets: 249 K/uL (ref 150.0–400.0)
RBC: 4.13 Mil/uL (ref 3.87–5.11)
RDW: 14.7 % (ref 11.5–15.5)
WBC: 6.6 K/uL (ref 4.0–10.5)

## 2024-03-19 LAB — COMPREHENSIVE METABOLIC PANEL WITH GFR
ALT: 43 U/L — ABNORMAL HIGH (ref 3–35)
AST: 34 U/L (ref 5–37)
Albumin: 3.1 g/dL — ABNORMAL LOW (ref 3.5–5.2)
Alkaline Phosphatase: 97 U/L (ref 39–117)
BUN: 23 mg/dL (ref 6–23)
CO2: 26 meq/L (ref 19–32)
Calcium: 8.5 mg/dL (ref 8.4–10.5)
Chloride: 103 meq/L (ref 96–112)
Creatinine, Ser: 0.78 mg/dL (ref 0.40–1.20)
GFR: 90.82 mL/min
Glucose, Bld: 80 mg/dL (ref 70–99)
Potassium: 4.4 meq/L (ref 3.5–5.1)
Sodium: 136 meq/L (ref 135–145)
Total Bilirubin: 0.9 mg/dL (ref 0.2–1.2)
Total Protein: 6.6 g/dL (ref 6.0–8.3)

## 2024-03-19 LAB — TSH: TSH: 0.97 u[IU]/mL (ref 0.35–5.50)

## 2024-03-20 ENCOUNTER — Ambulatory Visit: Admitting: Nurse Practitioner

## 2024-03-20 ENCOUNTER — Encounter: Payer: Self-pay | Admitting: Nurse Practitioner

## 2024-03-20 VITALS — BP 118/83 | Ht 67.0 in | Wt 197.0 lb

## 2024-03-20 DIAGNOSIS — Z79899 Other long term (current) drug therapy: Secondary | ICD-10-CM | POA: Diagnosis not present

## 2024-03-20 DIAGNOSIS — R42 Dizziness and giddiness: Secondary | ICD-10-CM

## 2024-03-20 DIAGNOSIS — R0789 Other chest pain: Secondary | ICD-10-CM

## 2024-03-20 DIAGNOSIS — R002 Palpitations: Secondary | ICD-10-CM | POA: Diagnosis not present

## 2024-03-20 DIAGNOSIS — R600 Localized edema: Secondary | ICD-10-CM | POA: Diagnosis not present

## 2024-03-20 MED ORDER — MECLIZINE HCL 25 MG PO TABS: 25.0000 mg | ORAL_TABLET | Freq: Three times a day (TID) | ORAL | 0 refills | Status: AC | PRN

## 2024-03-20 NOTE — Progress Notes (Signed)
 "    Office Visit    Patient Name: Jill Hawkins Date of Encounter: 03/20/2024  Primary Care Provider:  Gretel App, NP Primary Cardiologist:  Deatrice Cage, MD  Cardiology APP:  Vivienne Lonni Ingle, NP   Chief Complaint    49 y.o. female with a history of palpitations, morbid obesity status post gastric bypass in 2015, rheumatoid arthritis, hypoglycemia, and migraines, who presents with follow-up related to lower extremity swelling.  Past Medical History   Subjective   Past Medical History:  Diagnosis Date   Anemia    hx iron infusions   Anxiety    Chronic pain    r/t ddd   Depression    Dyspnea    a. 05/2020 Echo: Nl LVEF, mild BAE, no significant valvular abnormalities.   Frequent headaches    GERD (gastroesophageal reflux disease)    History of echocardiogram    a. 05/2020 Echo: EF 55-60%, no rwma, nl RV fxn, mild-mod BAE, triv MR.   History of fainting spells of unknown cause    History of hiatal hernia    History of kidney stones    History of stress test    a. 06/2020 MV: Anterior wall ischemia; b. 2022 Cor CTA: Nl cors, Ca2+ = 0.   Hypertension    no issues since gastric bypass   Migraine    Morbid obesity (HCC)    a. 03/2013 s/p gastric bypass.   Palpitations    PONV (postoperative nausea and vomiting)    Rheumatoid arthritis (HCC)    UTI (lower urinary tract infection)    Past Surgical History:  Procedure Laterality Date   BOTOX  INJECTION N/A 06/07/2022   Procedure: BOTOX  INJECTION;  Surgeon: Desiderio Schanz, MD;  Location: [facility] ORS;  Service: General;  Laterality: N/A;   CARPAL TUNNEL RELEASE  02/28/2003   CESAREAN SECTION     x 2   CHOLECYSTECTOMY  02/27/2013   GASTRIC BYPASS  04/14/2013   GROWTH PLATE SURGERY  98/98/8009   Right hip   HIATAL HERNIA REPAIR     SPHINCTEROTOMY N/A 06/07/2022   Procedure: SPHINCTEROTOMY, botox  chemical;  Surgeon: Desiderio Schanz, MD;  Location: [facility] ORS;  Service: General;  Laterality: N/A;     Allergies  Allergies[1]     History of Present Illness      49 y.o. y/o female with a history of palpitations, morbid obesity status post gastric bypass in 2015, rheumatoid arthritis, hypoglycemia, and migraines.  She was evaluated by Dr.Khan in 2022 for dyspnea and palpitations.  Echocardiogram in April 2022 showed normal LV systolic function with mild biatrial enlargement and no significant valvular abnormalities.  She underwent a nuclear stress test in 06/2020, which showed possible anterior wall ischemia.  This was followed by cardiac CTA which showed evidence of coronary artery disease with a calcium score of 0.    In the setting of palpitations, she previously wore a Holter monitor though symptoms subsequently subsided.  [name] established care with Dr. Cage in November 2024 due to palpitations which is outlined above, had resolved prior to the visit and therefore no additional workup was warranted.  She was advised to follow-up as needed.   Patient had the flu in late December.  In the setting of flu, she noted profound dizziness and vertiginous type symptoms which she describes as the room spinning.  Despite her respiratory symptoms improving, she has continued to have dizziness that is worsened with some position changes though does not necessarily change with rotation of  her head.  Over the past few weeks, she has also noted significant bilateral lower extremity edema and initially noticed much of a 12 pound weight gain.  She initially saw a provider in late December and was advised to use compression socks, which did not significantly change her lower extremity edema and resulted in worsening tenderness of her lower legs, particularly on the left.  She then saw her primary care provider 2 days ago and was prescribed Lasix  20 mg with recommendation to take it for 3 days straight and then as needed for ongoing swelling.  After 2 days of Lasix , she is down 5 pounds with significant  improvement in her ankle edema.  She has not had significant dyspnea but has continued to feel fatigued ever since her diagnosis of flu.  She says that her primary care provider referred her to ENT for ongoing vertiginous symptoms.  She denies dyspnea, palpitations, PND, orthopnea, syncope, or early satiety.  She says that for several weeks she has had a very mild discomfort in her upper chest, which is there essentially all day long and feels like indigestion. Objective   Home Medications    Current Outpatient Medications  Medication Sig Dispense Refill   ADDYI  100 MG TABS TAKE 1 TABLET BY MOUTH ONCE DAILY. 30 tablet 2   ALPRAZolam  (XANAX ) 1 MG tablet TAKE 1 TABLET(1 MG) BY MOUTH DAILY AS NEEDED FOR ANXIETY 15 tablet 1   B Complex Vitamins (VITAMIN-B COMPLEX) TABS Take 1 tablet by mouth daily.     cyanocobalamin  (,VITAMIN B-12,) 1000 MCG/ML injection Inject 1,000 mcg into the muscle every 7 (seven) days.     EPINEPHrine  0.3 mg/0.3 mL IJ SOAJ injection Inject 0.3 mg into the muscle as needed for anaphylaxis. 2 each 0   etanercept (ENBREL SURECLICK) 50 MG/ML injection Inject 50 mLs into the skin once a week. On friday     fexofenadine  (ALLEGRA ) 180 MG tablet TAKE 1 TABLET BY MOUTH DAILY 90 tablet 1   fluticasone  (FLONASE ) 50 MCG/ACT nasal spray Place 2 sprays into both nostrils daily. 16 mL 1   folic acid  (FOLVITE ) 1 MG tablet TAKE 1 TABLET BY MOUTH DAILY 90 tablet 0   furosemide  (LASIX ) 20 MG tablet Take 1 tablet (20 mg total) by mouth daily as needed for edema. 30 tablet 1   hydrocortisone  (ANUSOL -HC) 2.5 % rectal cream Place 1 Application rectally 2 (two) times daily. 30 g 0   levocetirizine (XYZAL ) 5 MG tablet Take 1 tablet (5 mg total) by mouth every evening. 30 tablet 11   levonorgestrel (LILETTA) 18.6 MCG/DAY IUD IUD by Intrauterine route.     lidocaine  (XYLOCAINE ) 5 % ointment Apply 1 Application topically 4 (four) times daily as needed. 35 g 0   meclizine  (ANTIVERT ) 25 MG tablet Take 1  tablet (25 mg total) by mouth 3 (three) times daily as needed for dizziness. 30 tablet 0   methocarbamol  (ROBAXIN ) 500 MG tablet Take 500 mg by mouth 3 (three) times daily.     Multiple Vitamin (MULTI-VITAMINS) TABS Take 1 tablet by mouth daily.     ondansetron  (ZOFRAN -ODT) 4 MG disintegrating tablet 4 mg every 8 (eight) hours as needed.     pregabalin  (LYRICA ) 100 MG capsule Take 1 capsule (100 mg total) by mouth 2 (two) times daily. 180 capsule 1   scopolamine  (TRANSDERM-SCOP) 1 MG/3DAYS Place 1 patch (1.5 mg total) onto the skin every 3 (three) days. 10 patch 0   sertraline  (ZOLOFT ) 50 MG tablet Take 1  tablet (50 mg total) by mouth at bedtime. 90 tablet 2   SUMAtriptan  (IMITREX ) 50 MG tablet TAKE 1 TABLET BY MOUTH AT ONSET OF MIGRAINE; MAY REPEAT 1 TABLET IN 2 HOURS IF NEEDED. 9 tablet 0   valACYclovir  (VALTREX ) 1000 MG tablet Take 1 tablet (1,000 mg total) by mouth 2 (two) times daily. X 1 day. Start ASAP after symptom onset. 20 tablet 2   montelukast  (SINGULAIR ) 10 MG tablet TAKE ONE TABLET BY MOUTH EVERY NIGHT AT BEDTIME (Patient not taking: Reported on 02/19/2024) 30 tablet 2   NEOMYCIN -POLYMYXIN-HYDROCORTISONE  (CORTISPORIN) 1 % SOLN OTIC solution Place 3 drops into the right ear every 6 (six) hours. (Patient not taking: Reported on 02/19/2024) 10 mL 0   No current facility-administered medications for this visit.     Physical Exam    VS:  BP 118/83 (BP Location: Left Arm, Patient Position: Sitting, Cuff Size: Normal)   Ht 5' 7 (1.702 m)   Wt 197 lb (89.4 kg)   SpO2 98%   BMI 30.85 kg/m  , BMI Body mass index is 30.85 kg/m. Orthostatic VS for the past 24 hrs (Last 3 readings):  BP- Lying Pulse- Lying BP- Sitting Pulse- Sitting BP- Standing at 0 minutes Pulse- Standing at 0 minutes BP- Standing at 3 minutes Pulse- Standing at 3 minutes  03/20/24 1509 110/78 80 116/84 80 104/77 101 115/82 99          GEN: Well nourished, well developed, in no acute distress. HEENT: normal. Neck:  Supple, no JVD, carotid bruits, or masses. Cardiac: RRR, no murmurs, rubs, or gallops. No clubbing, cyanosis, trace bilateral ankle edema.  Radials 2+/PT 2+ and equal bilaterally.  Respiratory:  Respirations regular and unlabored, clear to auscultation bilaterally. GI: Soft, nontender, nondistended, BS + x 4. MS: no deformity or atrophy. Skin: warm and dry, no rash. Neuro:  Strength and sensation are intact. Psych: Normal affect.  Accessory Clinical Findings    ECG personally reviewed by me today - EKG Interpretation Date/Time:  Thursday March 20 2024 15:04:27 EST Ventricular Rate:  74 PR Interval:  156 QRS Duration:  82 QT Interval:  384 QTC Calculation: 426 R Axis:   21  Text Interpretation: Normal sinus rhythm Low voltage QRS Confirmed by Vivienne Bruckner [phone]) on 03/20/2024 3:17:08 PM  - no acute changes.  Lab Results  Component Value Date   WBC 6.6 03/18/2024   HGB 12.7 03/18/2024   HCT 38.1 03/18/2024   MCV 92.2 03/18/2024   PLT 249.0 03/18/2024   Lab Results  Component Value Date   CREATININE 0.78 03/18/2024   BUN 23 03/18/2024   NA 136 03/18/2024   K 4.4 03/18/2024   CL 103 03/18/2024   CO2 26 03/18/2024   Lab Results  Component Value Date   ALT 43 (H) 03/18/2024   AST 34 03/18/2024   ALKPHOS 97 03/18/2024   BILITOT 0.9 03/18/2024   Lab Results  Component Value Date   CHOL 129 06/13/2023   HDL 69.10 06/13/2023   LDLCALC 53 06/13/2023   TRIG 39.0 06/13/2023   CHOLHDL 2 06/13/2023    Lab Results  Component Value Date   HGBA1C 4.9 04/30/2021   Lab Results  Component Value Date   TSH 0.97 03/18/2024       Assessment & Plan    1.  Bilateral lower extremity edema: Patient had the fluid December and subsequently began experiencing significant lower extremity edema with 12 pound weight gain.  Edema persisted despite using compression socks  prompting initiation of diuretic therapy by her primary care provider 2 days ago.  She has since lost 5  pounds after 2 doses of Lasix  20 mg.  She has trace edema on examination today.  She will take another 20 mg of Lasix  tomorrow may discuss the utility of daily weights and using objective measures for when to take Lasix  as needed (2024 hours or 5 pounds over the course of the week).  I will arrange for follow-up basic metabolic panel in 1 week since she is not taking a diuretic and also arrange for 2D echocardiogram.  2.  Dizziness: Since her fluid diagnosis, patient has been having vertiginous symptoms described as the room spinning, occurring throughout much of the day, including at rest, but somewhat worsened with position changes.  She is orthostatic by heart rate when moving from a sitting to standing position, though blood pressure remains relatively stable.  She has been referred to ENT.  I encouraged her to monitor her symptoms now that she is on a diuretic as orthostasis may be playing some role, though based on her description, symptoms may also be multifactorial-question postviral vertigo.  I provided her with a prescription for meclizine  25 mg 3 times daily as needed to see if this provides any relief.  3.  History of palpitations: Currently quiescent.  Prior monitoring was reportedly unremarkable.  4.  Chest discomfort: Since her fluid in December, she has noted a mild discomfort in her chest that has been present constantly and does not worsen with exertion or palpation.  She notes the symptoms are very mild.  ECG is normal today.  Prior history of normal coronary CTA with calcium score of 0 in 2022.  Reassurance provided.  Follow-up echo as above.  5.  Disposition: Follow-up basic metabolic panel in 1 week.  Follow-up echo.  Follow-up in clinic in 1 month or sooner if necessary.  Lonni Meager, NP 03/20/2024, 4:44 PM     [1]  Allergies Allergen Reactions   Penicillins Rash, Swelling and Dermatitis   Molybdenum Dermatitis   Oxycodone -Acetaminophen  Rash    Pt states she is taking  hlaf of 5 mg and doing ok   Tramadol Anxiety and Itching   Codeine Other (See Comments)    Chest pains   Tape Hives    Surgical tape   "

## 2024-03-20 NOTE — Patient Instructions (Signed)
 Medication Instructions:  Your physician recommends the following medication changes.  START TAKING: Meclizine  25 mg by mouth three times a day as needed    *If you need a refill on your cardiac medications before your next appointment, please call your pharmacy*  Lab Work: Your provider would like for you to return in 1 week to have the following labs drawn: BMP.   Please go to [facility] [address] ([facility] [address], [place] [address] You do not need an appointment.  They are open from 8 am- 4:30 pm.  Lunch from 1:00 pm- 2:00 pm You will not need to be fasting.    Testing/Procedures: Your physician has requested that you have an echocardiogram. Echocardiography is a painless test that uses sound waves to create images of your heart. It provides your doctor with information about the size and shape of your heart and how well your hearts chambers and valves are working.   You may receive an ultrasound enhancing agent through an IV if needed to better visualize your heart during the echo. This procedure takes approximately one hour.  There are no restrictions for this procedure.  This will take place at [address] [facility] [address], [place] [address]  Please note: We ask at that you not bring children with you during ultrasound (echo/ vascular) testing. Due to room size and safety concerns, children are not allowed in the ultrasound rooms during exams. Our front office staff cannot provide observation of children in our lobby area while testing is being conducted. An adult accompanying a patient to their appointment will only be allowed in the ultrasound room at the discretion of the ultrasound technician under special circumstances. We apologize for any inconvenience.   Follow-Up: At [facility], you and your health needs are our priority.  As part of our continuing mission to provide you with exceptional heart care, our  providers are all part of one team.  This team includes your primary Cardiologist (physician) and Advanced Practice Providers or APPs (Physician Assistants and Nurse Practitioners) who all work together to provide you with the care you need, when you need it.  Your next appointment:   1 month(s)  Provider:   Deatrice Cage, MD or Lonni Meager, NP

## 2024-03-25 ENCOUNTER — Encounter: Payer: Self-pay | Admitting: Nurse Practitioner

## 2024-03-27 ENCOUNTER — Ambulatory Visit: Payer: Self-pay | Admitting: Nurse Practitioner

## 2024-03-27 ENCOUNTER — Encounter: Payer: Self-pay | Admitting: Nurse Practitioner

## 2024-03-27 NOTE — Assessment & Plan Note (Signed)
 Fluid accumulation behind the right ear causes vertigo and hearing impairment. Previous treatments provided temporary relief. Refer to ENT for evaluation and management. Provide Epley exercises.

## 2024-03-27 NOTE — Assessment & Plan Note (Signed)
 She experiences an intermittent clogged sensation not relieved by Azithromycin , Prednisone , Allegra  D and Flonase . Fluid accumulation causing vertigo and hearing impairment. Refer to ENT for evaluation and management. Provide Epley exercises.

## 2024-03-27 NOTE — Assessment & Plan Note (Signed)
 Persistent edema with fatigue and chest tightness suggests a possible cardiac etiology. Differential includes cardiac, peripheral vascular disease, and lymphedema. Prescribe Lasix  20 mg daily for 3-5 days, then as needed. Refer to cardiology for evaluation of cardiac causes. Consider vascular referral if cardiology evaluation is inconclusive. Check lab work as outlined. Return precautions given to patient.

## 2024-04-02 ENCOUNTER — Ambulatory Visit

## 2024-04-02 ENCOUNTER — Ambulatory Visit: Payer: Self-pay | Admitting: Nurse Practitioner

## 2024-04-02 DIAGNOSIS — R600 Localized edema: Secondary | ICD-10-CM | POA: Diagnosis not present

## 2024-04-02 LAB — ECHOCARDIOGRAM COMPLETE
AR max vel: 3.78 cm2
AV Area VTI: 3.37 cm2
AV Area mean vel: 3.46 cm2
AV Mean grad: 6 mmHg
AV Peak grad: 9.9 mmHg
Ao pk vel: 1.57 m/s
Area-P 1/2: 4.31 cm2
S' Lateral: 3.09 cm

## 2024-04-03 LAB — BASIC METABOLIC PANEL WITH GFR
BUN/Creatinine Ratio: 24 — ABNORMAL HIGH (ref 9–23)
BUN: 24 mg/dL (ref 6–24)
CO2: 23 mmol/L (ref 20–29)
Calcium: 8.4 mg/dL — ABNORMAL LOW (ref 8.7–10.2)
Chloride: 103 mmol/L (ref 96–106)
Creatinine, Ser: 1 mg/dL (ref 0.57–1.00)
Glucose: 71 mg/dL (ref 70–99)
Potassium: 4.8 mmol/L (ref 3.5–5.2)
Sodium: 138 mmol/L (ref 134–144)
eGFR: 70 mL/min/{1.73_m2}

## 2024-04-03 MED ORDER — FUROSEMIDE 20 MG PO TABS: 10.0000 mg | ORAL_TABLET | Freq: Every day | ORAL | 3 refills | Status: AC

## 2024-04-18 ENCOUNTER — Ambulatory Visit: Admitting: Nurse Practitioner

## 2024-05-07 ENCOUNTER — Ambulatory Visit: Admitting: Nurse Practitioner
